# Patient Record
Sex: Female | Born: 1960 | Race: White | Hispanic: No | Marital: Married | Smoking: Never smoker
Health system: Southern US, Community
[De-identification: ages and names within clinical notes are randomized; demographics above are authoritative.]

## PROBLEM LIST (undated history)

## (undated) DIAGNOSIS — N83209 Unspecified ovarian cyst, unspecified side: Secondary | ICD-10-CM

## (undated) DIAGNOSIS — K219 Gastro-esophageal reflux disease without esophagitis: Secondary | ICD-10-CM

## (undated) DIAGNOSIS — D649 Anemia, unspecified: Secondary | ICD-10-CM

## (undated) DIAGNOSIS — Z170 Estrogen receptor positive status [ER+]: Principal | ICD-10-CM

## (undated) DIAGNOSIS — Z8742 Personal history of other diseases of the female genital tract: Secondary | ICD-10-CM

## (undated) DIAGNOSIS — B009 Herpesviral infection, unspecified: Secondary | ICD-10-CM

## (undated) DIAGNOSIS — J3089 Other allergic rhinitis: Secondary | ICD-10-CM

## (undated) DIAGNOSIS — J302 Other seasonal allergic rhinitis: Secondary | ICD-10-CM

## (undated) DIAGNOSIS — C50212 Malignant neoplasm of upper-inner quadrant of left female breast: Principal | ICD-10-CM

## (undated) DIAGNOSIS — I10 Essential (primary) hypertension: Secondary | ICD-10-CM

## (undated) DIAGNOSIS — T7840XA Allergy, unspecified, initial encounter: Secondary | ICD-10-CM

## (undated) HISTORY — PX: OTHER SURGICAL HISTORY: SHX169

## (undated) HISTORY — DX: Herpesviral infection, unspecified: B00.9

## (undated) HISTORY — DX: Allergy, unspecified, initial encounter: T78.40XA

## (undated) HISTORY — DX: Essential (primary) hypertension: I10

## (undated) HISTORY — DX: Other seasonal allergic rhinitis: J30.2

## (undated) HISTORY — DX: Unspecified ovarian cyst, unspecified side: N83.209

## (undated) HISTORY — DX: Other allergic rhinitis: J30.89

## (undated) HISTORY — PX: ADENOIDECTOMY: SUR15

## (undated) HISTORY — DX: Personal history of other diseases of the female genital tract: Z87.42

## (undated) HISTORY — DX: Anemia, unspecified: D64.9

## (undated) HISTORY — DX: Estrogen receptor positive status (ER+): Z17.0

## (undated) HISTORY — DX: Malignant neoplasm of upper-inner quadrant of left female breast: C50.212

## (undated) HISTORY — PX: BREAST SURGERY: SHX581

---

## 1998-03-26 ENCOUNTER — Other Ambulatory Visit: Admission: RE | Admit: 1998-03-26 | Discharge: 1998-03-26 | Payer: Self-pay | Admitting: Obstetrics and Gynecology

## 1998-05-07 ENCOUNTER — Ambulatory Visit (HOSPITAL_COMMUNITY): Admission: RE | Admit: 1998-05-07 | Discharge: 1998-05-07 | Payer: Self-pay | Admitting: Obstetrics and Gynecology

## 1998-10-01 ENCOUNTER — Inpatient Hospital Stay (HOSPITAL_COMMUNITY): Admission: AD | Admit: 1998-10-01 | Discharge: 1998-10-04 | Payer: Self-pay | Admitting: Obstetrics and Gynecology

## 1999-03-18 ENCOUNTER — Other Ambulatory Visit: Admission: RE | Admit: 1999-03-18 | Discharge: 1999-03-18 | Payer: Self-pay | Admitting: Obstetrics and Gynecology

## 2000-04-16 ENCOUNTER — Other Ambulatory Visit: Admission: RE | Admit: 2000-04-16 | Discharge: 2000-04-16 | Payer: Self-pay | Admitting: Obstetrics and Gynecology

## 2001-05-06 ENCOUNTER — Other Ambulatory Visit: Admission: RE | Admit: 2001-05-06 | Discharge: 2001-05-06 | Payer: Self-pay | Admitting: Obstetrics and Gynecology

## 2002-05-10 ENCOUNTER — Other Ambulatory Visit: Admission: RE | Admit: 2002-05-10 | Discharge: 2002-05-10 | Payer: Self-pay | Admitting: *Deleted

## 2003-05-16 ENCOUNTER — Other Ambulatory Visit: Admission: RE | Admit: 2003-05-16 | Discharge: 2003-05-16 | Payer: Self-pay | Admitting: *Deleted

## 2003-06-25 ENCOUNTER — Other Ambulatory Visit: Admission: RE | Admit: 2003-06-25 | Discharge: 2003-06-25 | Payer: Self-pay | Admitting: *Deleted

## 2004-05-22 ENCOUNTER — Other Ambulatory Visit: Admission: RE | Admit: 2004-05-22 | Discharge: 2004-05-22 | Payer: Self-pay | Admitting: *Deleted

## 2005-07-24 ENCOUNTER — Other Ambulatory Visit: Admission: RE | Admit: 2005-07-24 | Discharge: 2005-07-24 | Payer: Self-pay | Admitting: *Deleted

## 2005-09-01 ENCOUNTER — Ambulatory Visit: Payer: Self-pay | Admitting: Family Medicine

## 2005-09-08 ENCOUNTER — Ambulatory Visit: Payer: Self-pay | Admitting: Family Medicine

## 2005-09-15 ENCOUNTER — Ambulatory Visit: Payer: Self-pay | Admitting: Family Medicine

## 2005-09-22 ENCOUNTER — Ambulatory Visit: Payer: Self-pay | Admitting: Family Medicine

## 2005-10-06 ENCOUNTER — Ambulatory Visit: Payer: Self-pay | Admitting: Family Medicine

## 2006-09-07 ENCOUNTER — Other Ambulatory Visit: Admission: RE | Admit: 2006-09-07 | Discharge: 2006-09-07 | Payer: Self-pay | Admitting: Obstetrics & Gynecology

## 2007-09-15 ENCOUNTER — Other Ambulatory Visit: Admission: RE | Admit: 2007-09-15 | Discharge: 2007-09-15 | Payer: Self-pay | Admitting: Obstetrics and Gynecology

## 2008-12-28 ENCOUNTER — Encounter: Admission: RE | Admit: 2008-12-28 | Discharge: 2008-12-28 | Payer: Self-pay | Admitting: Emergency Medicine

## 2012-01-25 ENCOUNTER — Ambulatory Visit (INDEPENDENT_AMBULATORY_CARE_PROVIDER_SITE_OTHER): Payer: BC Managed Care – PPO | Admitting: Physician Assistant

## 2012-01-25 VITALS — BP 149/74 | HR 79 | Temp 98.4°F | Resp 16 | Ht 66.18 in | Wt 182.0 lb

## 2012-01-25 DIAGNOSIS — J019 Acute sinusitis, unspecified: Secondary | ICD-10-CM

## 2012-01-25 DIAGNOSIS — R05 Cough: Secondary | ICD-10-CM

## 2012-01-25 DIAGNOSIS — I10 Essential (primary) hypertension: Secondary | ICD-10-CM

## 2012-01-25 DIAGNOSIS — R059 Cough, unspecified: Secondary | ICD-10-CM

## 2012-01-25 MED ORDER — PROMETHAZINE-DM 6.25-15 MG/5ML PO SYRP: 5.0000 mL | ORAL_SOLUTION | Freq: Four times a day (QID) | ORAL | Status: AC | PRN

## 2012-01-25 MED ORDER — LOSARTAN POTASSIUM-HCTZ 100-25 MG PO TABS: 1.0000 | ORAL_TABLET | Freq: Every day | ORAL | Status: DC

## 2012-01-25 MED ORDER — AMOXICILLIN-POT CLAVULANATE 875-125 MG PO TABS: 1.0000 | ORAL_TABLET | Freq: Two times a day (BID) | ORAL | Status: AC

## 2012-01-25 NOTE — Progress Notes (Signed)
Patient ID: Kristina Sanders MRN: [id number], DOB: 05/03/1961, 51 y.o. Date of Encounter: 01/25/2012, 5:56 PM  Primary Physician: No primary provider on file.  Chief Complaint:  Chief Complaint  Patient presents with  . Sinusitis    x 2 weeks  advil, sudafed  . Hypertension    med refill    HPI: 51 y.o. year old female presents with 2 week history of nasal congestion, post nasal drip, sore throat, sinus pressure, and cough. Afebrile. No chills. Nasal congestion thick and green/yellow. Cough is productive of green/yellow sputum and not associated with time of day. Sinus pressure greatest along the right maxillary sinus. Upper teeth sore. Ears feel full, leading to sensation of muffled hearing. Has tried Sudafed without success. No GI complaints. Appetite slightly decreased.  Also requests refill of Hyzaar. Doing well with current dose. No side effects. Sometimes checks her BP at home with results of 120s/70s-80s. BP slightly elevated today secondary to above illness and taking Sudafed. No CP, HA, vision changes, or focal deficits.   No sick contacts, recent antibiotics, or recent travels.   No leg trauma, sedentary periods, h/o cancer, or tobacco use.  Past Medical History  Diagnosis Date  . HTN (hypertension)      Home Meds: Prior to Admission medications   Medication Sig Start Date End Date Taking? Authorizing Provider  losartan-hydrochlorothiazide (HYZAAR) 100-25 MG per tablet Take 1 tablet by mouth daily.   Yes Historical Provider, MD    Allergies: No Known Allergies  History   Social History  . Marital Status: Married    Spouse Name: N/A    Number of Children: N/A  . Years of Education: N/A   Occupational History  . Not on file.   Social History Main Topics  . Smoking status: Former Games developer  . Smokeless tobacco: Not on file  . Alcohol Use: Not on file  . Drug Use: Not on file  . Sexually Active: Not on file   Other Topics Concern  . Not on file   Social  History Narrative  . No narrative on file     Review of Systems: Constitutional: negative for chills, fever, night sweats or weight changes Cardiovascular: negative for chest pain or palpitations Respiratory: negative for hemoptysis, wheezing, or shortness of breath Abdominal: negative for abdominal pain, nausea, vomiting or diarrhea Dermatological: negative for rash Neurologic: negative for headache   Physical Exam: Blood pressure 149/74, pulse 79, temperature 98.4 F (36.9 C), temperature source Oral, resp. rate 16, height 5' 6.18" (1.681 m), weight 182 lb (82.555 kg)., Body mass index is 29.22 kg/(m^2). General: Well developed, well nourished, in no acute distress. Head: Normocephalic, atraumatic, eyes without discharge, sclera non-icteric, nares are congested. Bilateral auditory canals clear, TM's are without perforation, pearly grey with reflective cone of light bilaterally. Serous effusion bilaterally behind TM's. Right maxillary sinus TTP. Oral cavity moist, dentition normal. Posterior pharynx with post nasal drip and mild erythema. No peritonsillar abscess or tonsillar exudate. Neck: Supple. No thyromegaly. Full ROM. No lymphadenopathy. Lungs: Clear bilaterally to auscultation without wheezes, rales, or rhonchi. Breathing is unlabored.  Heart: RRR with S1 S2. No murmurs, rubs, or gallops appreciated. Msk:  Strength and tone normal for age. Extremities: No clubbing or cyanosis. No edema. Neuro: Alert and oriented X 3. Moves all extremities spontaneously. CNII-XII grossly in tact. Psych:  Responds to questions appropriately with a normal affect.    BMP pending  ASSESSMENT AND PLAN:  51 y.o. year old female with sinusitis  and hypertension 1. Sinusitis -Augmentin 875/125 mg #20 1 po bid no RF -Phenergan DM #120 mL 1 tsp po q 6 hours prn cough no RF SED -Mucinex -Tylenol/Motrin prn -Rest/fluids -RTC precautions -RTC 3-5 days if no improvement  2. Hypertension -Refill  Hyzaar 100/25 mg #30 1 po daily RF 6 -Avoid Sudafed in the future -Healthy diet and exercise -Await BMP  Signed, Eula Listen, PA-C 01/25/2012 5:56 PM

## 2012-01-26 LAB — BASIC METABOLIC PANEL
CO2: 26 mEq/L (ref 19–32)
Calcium: 9.8 mg/dL (ref 8.4–10.5)
Chloride: 101 mEq/L (ref 96–112)
Glucose, Bld: 97 mg/dL (ref 70–99)
Potassium: 3.3 mEq/L — ABNORMAL LOW (ref 3.5–5.3)
Sodium: 139 mEq/L (ref 135–145)

## 2012-10-08 ENCOUNTER — Encounter: Payer: Self-pay | Admitting: Family Medicine

## 2012-10-08 DIAGNOSIS — J3089 Other allergic rhinitis: Secondary | ICD-10-CM

## 2012-11-15 ENCOUNTER — Other Ambulatory Visit: Payer: Self-pay | Admitting: Physician Assistant

## 2012-11-15 NOTE — Telephone Encounter (Signed)
Needs OV.  

## 2012-11-26 ENCOUNTER — Ambulatory Visit (INDEPENDENT_AMBULATORY_CARE_PROVIDER_SITE_OTHER): Payer: BC Managed Care – PPO | Admitting: Emergency Medicine

## 2012-11-26 VITALS — BP 136/81 | HR 77 | Temp 98.7°F | Resp 18 | Ht 67.0 in | Wt 187.8 lb

## 2012-11-26 DIAGNOSIS — J018 Other acute sinusitis: Secondary | ICD-10-CM

## 2012-11-26 MED ORDER — LOSARTAN POTASSIUM-HCTZ 100-25 MG PO TABS: 1.0000 | ORAL_TABLET | Freq: Every day | ORAL | Status: DC

## 2012-11-26 MED ORDER — PSEUDOEPHEDRINE-GUAIFENESIN ER 60-600 MG PO TB12: 1.0000 | ORAL_TABLET | Freq: Two times a day (BID) | ORAL | Status: DC

## 2012-11-26 MED ORDER — AMOXICILLIN-POT CLAVULANATE 875-125 MG PO TABS: 1.0000 | ORAL_TABLET | Freq: Two times a day (BID) | ORAL | Status: DC

## 2012-11-26 NOTE — Progress Notes (Signed)
[facility] [address], [place] [address] [phone]  Date:  11/26/2012   Name:  Kristina Sanders   DOB:  1960-12-06   MRN:  [id number]  PCP:  No primary provider on file.    Chief Complaint: ear pressure, Nasal Congestion and Cough   History of Present Illness:  Kristina Sanders is a 52 y.o. very pleasant female patient who presents with the following:  Ill with a "headcold" since Christmas and over the past week has developed pain in frontal region and purulent nasal drainage and post nasal drip.  Non productive cough.  No fever or chills.  Malaise and fatigue.  Teacher in elementary school.  No improvement with OTC medication  Patient Active Problem List  Diagnosis  . HTN (hypertension)  . Allergic rhinitis due to other allergen    Past Medical History  Diagnosis Date  . HTN (hypertension)   . Allergy   . Perennial allergic rhinitis     [place]   . Seasonal allergic rhinitis     [place]    No past surgical history on file.  History  Substance Use Topics  . Smoking status: Never Smoker   . Smokeless tobacco: Not on file  . Alcohol Use: Yes    No family history on file.  No Known Allergies  Medication list has been reviewed and updated.  Current Outpatient Prescriptions on File Prior to Visit  Medication Sig Dispense Refill  . losartan-hydrochlorothiazide (HYZAAR) 100-25 MG per tablet Take 1 tablet by mouth daily. NEED OFFICE VISIT  30 tablet  0    Review of Systems:  As per HPI, otherwise negative.    Physical Examination: Filed Vitals:   11/26/12 1328  BP: 136/81  Pulse: 77  Temp: 98.7 F (37.1 C)  Resp: 18   Filed Vitals:   11/26/12 1328  Height: 5\' 7"  (1.702 m)  Weight: 187 lb 12.8 oz (85.186 kg)   Body mass index is 29.41 kg/(m^2). Ideal Body Weight: Weight in (lb) to have BMI = 25: 159.3   GEN: WDWN, NAD, Non-toxic, A & O x 3 HEENT: Atraumatic, Normocephalic. Neck supple. No masses, No LAD. Ears and  Nose: No external deformity.  TM negative.  Purulent nasal drainage CV: RRR, No M/G/R. No JVD. No thrill. No extra heart sounds. PULM: CTA B, no wheezes, crackles, rhonchi. No retractions. No resp. distress. No accessory muscle use. ABD: S, NT, ND, +BS. No rebound. No HSM. EXTR: No c/c/e NEURO Normal gait.  PSYCH: Normally interactive. Conversant. Not depressed or anxious appearing.  Calm demeanor.    Assessment and Plan: Sinusitis augmentin mucinex d Follow up as needed  Carmelina Dane, MD

## 2013-01-18 ENCOUNTER — Other Ambulatory Visit: Payer: Self-pay | Admitting: Physician Assistant

## 2013-05-03 ENCOUNTER — Ambulatory Visit: Payer: BC Managed Care – PPO | Admitting: Obstetrics & Gynecology

## 2013-05-05 ENCOUNTER — Ambulatory Visit: Payer: BC Managed Care – PPO | Admitting: Obstetrics & Gynecology

## 2013-06-13 ENCOUNTER — Other Ambulatory Visit: Payer: Self-pay | Admitting: Emergency Medicine

## 2013-06-14 ENCOUNTER — Other Ambulatory Visit: Payer: Self-pay | Admitting: Physician Assistant

## 2013-07-13 ENCOUNTER — Ambulatory Visit: Payer: BC Managed Care – PPO | Admitting: Obstetrics & Gynecology

## 2013-07-20 ENCOUNTER — Other Ambulatory Visit: Payer: Self-pay | Admitting: Physician Assistant

## 2013-07-24 ENCOUNTER — Telehealth: Payer: Self-pay

## 2013-07-24 MED ORDER — LOSARTAN POTASSIUM-HCTZ 100-25 MG PO TABS: 1.0000 | ORAL_TABLET | Freq: Every day | ORAL | Status: DC

## 2013-07-24 NOTE — Telephone Encounter (Signed)
Sent in

## 2013-07-24 NOTE — Telephone Encounter (Signed)
Patient made an appt. For Thursday for blood pressure med refills. Patient wants to know if we can call the pharmacy ([facility] [address]) and allow a few pills to get her through until her appt. Thursday 07/27/13.  Her number is [phone]

## 2013-07-27 ENCOUNTER — Encounter: Payer: Self-pay | Admitting: Family Medicine

## 2013-07-27 ENCOUNTER — Ambulatory Visit (INDEPENDENT_AMBULATORY_CARE_PROVIDER_SITE_OTHER): Payer: BC Managed Care – PPO | Admitting: Family Medicine

## 2013-07-27 VITALS — BP 170/94 | HR 82 | Temp 99.4°F | Resp 16 | Ht 66.0 in | Wt 190.0 lb

## 2013-07-27 DIAGNOSIS — I10 Essential (primary) hypertension: Secondary | ICD-10-CM

## 2013-07-27 DIAGNOSIS — Z0271 Encounter for disability determination: Secondary | ICD-10-CM

## 2013-07-27 DIAGNOSIS — Z760 Encounter for issue of repeat prescription: Secondary | ICD-10-CM

## 2013-07-27 MED ORDER — LOSARTAN POTASSIUM-HCTZ 100-25 MG PO TABS: 1.0000 | ORAL_TABLET | Freq: Every day | ORAL | Status: DC

## 2013-07-29 ENCOUNTER — Other Ambulatory Visit: Payer: Self-pay | Admitting: Family Medicine

## 2013-07-29 DIAGNOSIS — E876 Hypokalemia: Secondary | ICD-10-CM

## 2013-07-29 DIAGNOSIS — I10 Essential (primary) hypertension: Secondary | ICD-10-CM

## 2013-07-29 NOTE — Progress Notes (Signed)
S:  This 52 y.o. Cauc female is here for medication refill for anti-hypertensive; BP well controlled on current medication which she ran out of 2 days ago. She has been asymptomatic. BP readings at home: 120-130/70-80. Pt denies vision changes, CP or tightness, palpitations, SOB or DOE, cough, edema, HA, dizziness, numbness or syncope.  PMHx, Soc Hx and Fam Hx reviewed.  ROS: As per HPI.  O: Filed Vitals:   07/27/13 1459  BP: 170/94  Pulse: 82  Temp: 99.4 F (37.4 C)  Resp: 16   GEN: In NAD; WN,WD. HENT: [place]/AT; EOMI w/ clear conj/sclerae. Otherwise unremarkable. COR: RRR. LUNGS: Normal resp rate and effort. SKIN: W&D. NEURO: A&O x 3; nonfocal.  A/P: Unspecified essential hypertension- Stable w/ elevated today because pt out of med. No changes at this time.  Issue of repeat prescriptions Meds ordered this encounter  Medications  . losartan-hydrochlorothiazide (HYZAAR) 100-25 MG per tablet    Sig: Take 1 tablet by mouth daily.    Dispense:  30 tablet    Refill:  5

## 2013-07-30 ENCOUNTER — Telehealth: Payer: Self-pay | Admitting: Family Medicine

## 2013-07-30 NOTE — Telephone Encounter (Signed)
Left message on mobil voice mail to call back so she can come back in for lab only BMET per [facility]

## 2013-08-20 ENCOUNTER — Telehealth: Payer: Self-pay | Admitting: Family Medicine

## 2013-08-20 NOTE — Telephone Encounter (Signed)
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

## 2013-10-05 ENCOUNTER — Encounter: Payer: Self-pay | Admitting: Family Medicine

## 2013-11-13 ENCOUNTER — Encounter: Payer: Self-pay | Admitting: Obstetrics & Gynecology

## 2013-11-13 ENCOUNTER — Ambulatory Visit (INDEPENDENT_AMBULATORY_CARE_PROVIDER_SITE_OTHER): Payer: BC Managed Care – PPO | Admitting: Obstetrics & Gynecology

## 2013-11-13 VITALS — BP 156/80 | HR 64 | Resp 20 | Ht 66.25 in | Wt 187.0 lb

## 2013-11-13 DIAGNOSIS — Z0000 Encounter for general adult medical examination without abnormal findings: Secondary | ICD-10-CM

## 2013-11-13 DIAGNOSIS — Z01419 Encounter for gynecological examination (general) (routine) without abnormal findings: Secondary | ICD-10-CM

## 2013-11-13 DIAGNOSIS — N912 Amenorrhea, unspecified: Secondary | ICD-10-CM

## 2013-11-13 LAB — POCT URINALYSIS DIPSTICK
Bilirubin, UA: NEGATIVE
Glucose, UA: NEGATIVE
Ketones, UA: NEGATIVE
Nitrite, UA: NEGATIVE
pH, UA: 5

## 2013-11-13 NOTE — Progress Notes (Signed)
52 y.o. G4P3 MarriedCaucasianF here for annual exam.  No vaginal bleeding in a little over a year.  IUD was placed 5/13.  Bled for a few months after IUD placement.  Does have some hot flashes and night sweats.  Doesn't really interfere with sleep.    Saw PCP every six months.  Blood work is done every six months.  Will do fasting labs at next visit.  She does check her BP at home and values are better than when in doctor's office.  D/W pt her MMG.  Pt did the 3D.      Sexually active: yes  The current method of family planning is vasectomy and IUD.    Exercising: yes  occasional walking Smoker:  no  Health Maintenance: Pap:  04/06/12 WNL/negative HR HPV History of abnormal Pap:  no MMG:  05/16/13 3D normal Colonoscopy:  None, pt is planning on doing it this summer BMD:   none TDaP:  2007 Screening Labs: PCP, Hb today: 12.3, Urine today: RBC-trace   reports that she has never smoked. She has never used smokeless tobacco. She reports that she drinks about 3.0 ounces of alcohol per week. She reports that she does not use illicit drugs.  Past Medical History  Diagnosis Date  . HTN (hypertension)   . Allergy   . Perennial allergic rhinitis     [place]   . Seasonal allergic rhinitis     [place]  . HSV infection     h/o  . Anemia     Past Surgical History  Procedure Laterality Date  . Cesarean section    . Tonsillectomy and adenoidectomy      Current Outpatient Prescriptions  Medication Sig Dispense Refill  . losartan-hydrochlorothiazide (HYZAAR) 100-25 MG per tablet Take 1 tablet by mouth daily.  30 tablet  5  . NON FORMULARY Allergy injections once weekly       No current facility-administered medications for this visit.    Family History  Problem Relation Age of Onset  . Mitral valve prolapse Mother   . Breast cancer Paternal Aunt 70    ROS:  Pertinent items are noted in HPI.  Otherwise, a comprehensive ROS was negative.  Exam:   BP 156/80  Pulse 64  Resp 20  Ht  5' 6.25" (1.683 m)  Wt 187 lb (84.823 kg)  BMI 29.95 kg/m2  LMP 11/16/2010  Weight change: +5lbs   Height: 5' 6.25" (168.3 cm)  Ht Readings from Last 3 Encounters:  11/13/13 5' 6.25" (1.683 m)  07/27/13 5\' 6"  (1.676 m)  11/26/12 5\' 7"  (1.702 m)    General appearance: alert, cooperative and appears stated age Head: Normocephalic, without obvious abnormality, atraumatic Neck: no adenopathy, supple, symmetrical, trachea midline and thyroid normal to inspection and palpation Lungs: clear to auscultation bilaterally Breasts: normal appearance, no masses or tenderness Heart: regular rate and rhythm Abdomen: soft, non-tender; bowel sounds normal; no masses,  no organomegaly Extremities: extremities normal, atraumatic, no cyanosis or edema Skin: Skin color, texture, turgor normal. No rashes or lesions Lymph nodes: Cervical, supraclavicular, and axillary nodes normal. No abnormal inguinal nodes palpated Neurologic: Grossly normal   Pelvic: External genitalia:  no lesions              Urethra:  normal appearing urethra with no masses, tenderness or lesions              Bartholins and Skenes: normal  Vagina: normal appearing vagina with normal color and discharge, no lesions              Cervix: no lesions              Pap taken: no Bimanual Exam:  Uterus:  normal size, contour, position, consistency, mobility, non-tender              Adnexa: normal adnexa and no mass, fullness, tenderness               Rectovaginal: Confirms               Anus:  normal sphincter tone, no lesions  A:  Well Woman with normal exam H/O menorrhagia and adenomyosis Mirena placed 5/13 Hypertension  P:   Mammogram yearly. pap smear not indicated.  Normal pap with Neg HR HPV 5/13 Labs with PCP every 6 months. return annually or prn  An After Visit Summary was printed and given to the patient.

## 2013-11-13 NOTE — Patient Instructions (Signed)

## 2013-11-14 LAB — FOLLICLE STIMULATING HORMONE: FSH: 63 m[IU]/mL

## 2013-11-20 ENCOUNTER — Telehealth: Payer: Self-pay

## 2013-11-20 NOTE — Telephone Encounter (Signed)
Message copied by Robley Fries on Mon Nov 20, 2013  1:58 PM ------      Message from: Megan Salon      Created: Wed Nov 15, 2013  7:27 AM       Please inform [place] shows menopausal range.  I would not recommend IUD removal at this time.  I would just wait until is due to be removed.  If she desires removal sooner, please have her call and I can take it out. ------

## 2013-11-20 NOTE — Telephone Encounter (Signed)
lmtcb

## 2013-11-20 NOTE — Telephone Encounter (Signed)
Patient is calling Kelly back. °

## 2013-11-21 NOTE — Telephone Encounter (Signed)
Returning a call to [place]. Please call after 3:00.

## 2013-11-21 NOTE — Telephone Encounter (Signed)
Lmtcb//kn 

## 2013-11-24 MED ORDER — NITROFURANTOIN MONOHYD MACRO 100 MG PO CAPS: ORAL_CAPSULE | ORAL | Status: DC

## 2013-11-24 NOTE — Telephone Encounter (Signed)
Rx done. 

## 2013-11-24 NOTE — Telephone Encounter (Signed)
Patient notified of lab results. Will call back if she decides to have her IUD removed earlier. States she forgot to ask Dr Sabra Heck for macrobid rx as needed. Chart is in your in box.

## 2014-03-02 ENCOUNTER — Other Ambulatory Visit: Payer: Self-pay | Admitting: Family Medicine

## 2014-03-02 ENCOUNTER — Other Ambulatory Visit: Payer: Self-pay | Admitting: Physician Assistant

## 2014-03-02 NOTE — Telephone Encounter (Signed)
Phoned pt lm RTC for follow up

## 2014-04-03 ENCOUNTER — Ambulatory Visit (INDEPENDENT_AMBULATORY_CARE_PROVIDER_SITE_OTHER): Payer: BC Managed Care – PPO | Admitting: Physician Assistant

## 2014-04-03 VITALS — BP 160/88 | HR 86 | Temp 99.5°F | Resp 16 | Ht 65.75 in | Wt 182.2 lb

## 2014-04-03 DIAGNOSIS — I10 Essential (primary) hypertension: Secondary | ICD-10-CM

## 2014-04-03 LAB — LIPID PANEL
CHOLESTEROL: 196 mg/dL (ref 0–200)
HDL: 60 mg/dL (ref >39)
LDL CALC: 113 mg/dL — AB (ref 0–99)
TRIGLYCERIDES: 116 mg/dL (ref <150)
Total CHOL/HDL Ratio: 3.3 Ratio
VLDL: 23 mg/dL (ref 0–40)

## 2014-04-03 LAB — BASIC METABOLIC PANEL
BUN: 13 mg/dL (ref 6–23)
CHLORIDE: 104 meq/L (ref 96–112)
CO2: 24 mEq/L (ref 19–32)
CREATININE: 0.56 mg/dL (ref 0.50–1.10)
Calcium: 9.4 mg/dL (ref 8.4–10.5)
GLUCOSE: 121 mg/dL — AB (ref 70–99)
Potassium: 3.8 mEq/L (ref 3.5–5.3)
Sodium: 139 mEq/L (ref 135–145)

## 2014-04-03 MED ORDER — LOSARTAN POTASSIUM-HCTZ 100-25 MG PO TABS: 1.0000 | ORAL_TABLET | Freq: Every day | ORAL | Status: DC

## 2014-04-03 NOTE — Progress Notes (Signed)
   Subjective:    Patient ID: Kristina Sanders, female    DOB: [birth year], 53 y.o.   MRN: [id number]  HPI   Kristina Sanders is a very pleasant 53 yr old female here for refill of Hyzaar.  Has been on this medication for several years, unsure exactly when she started.  Was on lisinopril previously but had cough with this.  She reports that she checks blood pressure infrequently at home but typically sees systolics 009-381 with diastolics 82-99.  No fam hx HTN.  She denies CP, SOB, edema, HA, visual change.  Did not take meds two days ago or yesterday, did take today though BP is still elevated.  She reports she has white coat HTN as well.  She is fasting today would like lipid panel in addition to BMP   Review of Systems  Constitutional: Negative.   Respiratory: Negative for cough, shortness of breath and wheezing.   Cardiovascular: Negative for chest pain, palpitations and leg swelling.  Musculoskeletal: Negative.   Skin: Negative.        Objective:   Physical Exam  Vitals reviewed. Constitutional: She is oriented to person, place, and time. She appears well-developed and well-nourished. No distress.  HENT:  Head: Normocephalic.  Eyes: Conjunctivae are normal. No scleral icterus.  Cardiovascular: Normal rate, regular rhythm, normal heart sounds and intact distal pulses.   Pulmonary/Chest: Effort normal and breath sounds normal. She has no wheezes. She has no rales.  Musculoskeletal: She exhibits no edema.  Neurological: She is alert and oriented to person, place, and time.  Skin: Skin is warm and dry.  Psychiatric: She has a normal mood and affect. Her behavior is normal.       Assessment & Plan:  Hypertension - Plan: Basic metabolic panel, Lipid panel, losartan-hydrochlorothiazide (HYZAAR) 100-25 MG per tablet   Kristina Sanders is a very pleasant 53 yr old female here for RF of HTN medication.  Has been well controlled on Hyzaar.  BP elevated today but has been out of meds x 3 days,  additionally pt endorses white coat htn.  Will continue same dose of medication.  Encouraged pt to check home BPs 1-2x/wk.  If >140/>90 may need to adjust meds.  Check BMP today.  FLP per pt request  Pt to call or RTC if worsening or not improving  E. Natividad Brood MHS, PA-C [facility] 5/19/201512:16 PM

## 2014-04-03 NOTE — Patient Instructions (Signed)
Continue taking the Hyzaar once daily  Check your blood pressure at home about twice per week - check at random and varying times.  If you see numbers that are consistently >140 on top or >90 on the bottom, we may need to adjust your medication  I will let you know when labs are back and if we need to do anything else based on them   Hypertension As your heart beats, it forces blood through your arteries. This force is your blood pressure. If the pressure is too high, it is called hypertension (HTN) or high blood pressure. HTN is dangerous because you may have it and not know it. High blood pressure may mean that your heart has to work harder to pump blood. Your arteries may be narrow or stiff. The extra work puts you at risk for heart disease, stroke, and other problems.  Blood pressure consists of two numbers, a higher number over a lower, 110/72, for example. It is stated as "110 over 72." The ideal is below 120 for the top number (systolic) and under 80 for the bottom (diastolic). Write down your blood pressure today. You should pay close attention to your blood pressure if you have certain conditions such as:  Heart failure.  Prior heart attack.  Diabetes  Chronic kidney disease.  Prior stroke.  Multiple risk factors for heart disease. To see if you have HTN, your blood pressure should be measured while you are seated with your arm held at the level of the heart. It should be measured at least twice. A one-time elevated blood pressure reading (especially in the Emergency Department) does not mean that you need treatment. There may be conditions in which the blood pressure is different between your right and left arms. It is important to see your caregiver soon for a recheck. Most people have essential hypertension which means that there is not a specific cause. This type of high blood pressure may be lowered by changing lifestyle factors such as:  Stress.  Smoking.  Lack of  exercise.  Excessive weight.  Drug/tobacco/alcohol use.  Eating less salt. Most people do not have symptoms from high blood pressure until it has caused damage to the body. Effective treatment can often prevent, delay or reduce that damage. TREATMENT  When a cause has been identified, treatment for high blood pressure is directed at the cause. There are a large number of medications to treat HTN. These fall into several categories, and your caregiver will help you select the medicines that are best for you. Medications may have side effects. You should review side effects with your caregiver. If your blood pressure stays high after you have made lifestyle changes or started on medicines,   Your medication(s) may need to be changed.  Other problems may need to be addressed.  Be certain you understand your prescriptions, and know how and when to take your medicine.  Be sure to follow up with your caregiver within the time frame advised (usually within two weeks) to have your blood pressure rechecked and to review your medications.  If you are taking more than one medicine to lower your blood pressure, make sure you know how and at what times they should be taken. Taking two medicines at the same time can result in blood pressure that is too low. SEEK IMMEDIATE MEDICAL CARE IF:  You develop a severe headache, blurred or changing vision, or confusion.  You have unusual weakness or numbness, or a faint feeling.  You have severe chest or abdominal pain, vomiting, or breathing problems. MAKE SURE YOU:   Understand these instructions.  Will watch your condition.  Will get help right away if you are not doing well or get worse. Document Released: 11/02/2005 Document Revised: 01/25/2012 Document Reviewed: 06/22/2008 [facility] Patient Information 2014 [place].

## 2014-04-04 ENCOUNTER — Encounter: Payer: Self-pay | Admitting: Radiology

## 2014-07-29 ENCOUNTER — Encounter (HOSPITAL_COMMUNITY): Payer: Self-pay | Admitting: Emergency Medicine

## 2014-07-29 ENCOUNTER — Emergency Department (HOSPITAL_COMMUNITY)
Admission: EM | Admit: 2014-07-29 | Discharge: 2014-07-30 | Disposition: A | Discharge status: Home / Self Care | Payer: BC Managed Care – PPO | Attending: Emergency Medicine | Admitting: Emergency Medicine

## 2014-07-29 DIAGNOSIS — R110 Nausea: Secondary | ICD-10-CM | POA: Diagnosis not present

## 2014-07-29 DIAGNOSIS — R062 Wheezing: Secondary | ICD-10-CM | POA: Insufficient documentation

## 2014-07-29 DIAGNOSIS — J029 Acute pharyngitis, unspecified: Secondary | ICD-10-CM | POA: Diagnosis not present

## 2014-07-29 DIAGNOSIS — R000 Tachycardia, unspecified: Secondary | ICD-10-CM | POA: Insufficient documentation

## 2014-07-29 DIAGNOSIS — E876 Hypokalemia: Secondary | ICD-10-CM | POA: Diagnosis not present

## 2014-07-29 DIAGNOSIS — R209 Unspecified disturbances of skin sensation: Secondary | ICD-10-CM | POA: Diagnosis present

## 2014-07-29 DIAGNOSIS — Z862 Personal history of diseases of the blood and blood-forming organs and certain disorders involving the immune mechanism: Secondary | ICD-10-CM | POA: Diagnosis not present

## 2014-07-29 DIAGNOSIS — Z8619 Personal history of other infectious and parasitic diseases: Secondary | ICD-10-CM | POA: Insufficient documentation

## 2014-07-29 DIAGNOSIS — I10 Essential (primary) hypertension: Secondary | ICD-10-CM | POA: Diagnosis not present

## 2014-07-29 DIAGNOSIS — Z79899 Other long term (current) drug therapy: Secondary | ICD-10-CM | POA: Insufficient documentation

## 2014-07-29 LAB — I-STAT CHEM 8, ED
BUN: 14 mg/dL (ref 6–23)
CALCIUM ION: 1.1 mmol/L — AB (ref 1.12–1.23)
CHLORIDE: 98 meq/L (ref 96–112)
Creatinine, Ser: 0.7 mg/dL (ref 0.50–1.10)
Glucose, Bld: 175 mg/dL — ABNORMAL HIGH (ref 70–99)
HCT: 35 % — ABNORMAL LOW (ref 36.0–46.0)
Hemoglobin: 11.9 g/dL — ABNORMAL LOW (ref 12.0–15.0)
POTASSIUM: 2.4 meq/L — AB (ref 3.7–5.3)
Sodium: 138 mEq/L (ref 137–147)
TCO2: 24 mmol/L (ref 0–100)

## 2014-07-29 LAB — BASIC METABOLIC PANEL
ANION GAP: 11 (ref 5–15)
BUN: 12 mg/dL (ref 6–23)
CHLORIDE: 106 meq/L (ref 96–112)
CO2: 25 mEq/L (ref 19–32)
Calcium: 8.6 mg/dL (ref 8.4–10.5)
Creatinine, Ser: 0.56 mg/dL (ref 0.50–1.10)
Glucose, Bld: 103 mg/dL — ABNORMAL HIGH (ref 70–99)
POTASSIUM: 3.5 meq/L — AB (ref 3.7–5.3)
SODIUM: 142 meq/L (ref 137–147)

## 2014-07-29 LAB — MAGNESIUM: Magnesium: 1.7 mg/dL (ref 1.5–2.5)

## 2014-07-29 MED ORDER — SODIUM CHLORIDE 0.9 % IV BOLUS (SEPSIS)
1000.0000 mL | Freq: Once | INTRAVENOUS | Status: AC
  Administered 2014-07-29: 1000 mL via INTRAVENOUS

## 2014-07-29 MED ORDER — POTASSIUM CHLORIDE 10 MEQ/100ML IV SOLN
10.0000 meq | Freq: Once | INTRAVENOUS | Status: AC
  Administered 2014-07-29: 10 meq via INTRAVENOUS
  Filled 2014-07-29: qty 100

## 2014-07-29 MED ORDER — POTASSIUM CHLORIDE CRYS ER 20 MEQ PO TBCR
40.0000 meq | EXTENDED_RELEASE_TABLET | Freq: Once | ORAL | Status: AC
  Administered 2014-07-29: 40 meq via ORAL
  Filled 2014-07-29: qty 2

## 2014-07-29 NOTE — ED Provider Notes (Signed)
Patient present to the emergency complaints of numbness tachycardia and paresthesias. She had numbness in her hands and cramping in both hands Physical Exam  BP 147/68  Pulse 96  Temp(Src) 98.2 F (36.8 C) (Oral)  Resp 18  SpO2 99%  Physical Exam  Nursing note and vitals reviewed. Constitutional: She appears well-developed and well-nourished. No distress.  HENT:  Head: Normocephalic and atraumatic.  Right Ear: External ear normal.  Left Ear: External ear normal.  Eyes: Conjunctivae are normal. Right eye exhibits no discharge. Left eye exhibits no discharge. No scleral icterus.  Neck: Neck supple. No tracheal deviation present.  Cardiovascular: Normal rate.   Pulmonary/Chest: Effort normal. No stridor. No respiratory distress.  Musculoskeletal: She exhibits no edema.  Neurological: She is alert. Cranial nerve deficit: no gross deficits.  Normal strength and sensation throughout no facial droop  Skin: Skin is warm and dry. No rash noted.  Psychiatric: She has a normal mood and affect.   Medications  sodium chloride 0.9 % bolus 1,000 mL (not administered)  potassium chloride 10 mEq in 100 mL IVPB (10 mEq Intravenous New Bag/Given 07/29/14 1955)  sodium chloride 0.9 % bolus 1,000 mL (1,000 mLs Intravenous New Bag/Given 07/29/14 1850)  potassium chloride SA (K-DUR,KLOR-CON) CR tablet 40 mEq (40 mEq Oral Given 07/29/14 1955)     EKG Interpretation  Date/Time:  Sunday July 29 2014 18:04:41 EDT Ventricular Rate:  118 PR Interval:  110 QRS Duration: 74 QT Interval:  275 QTC Calculation: 385 R Axis:   77 Text Interpretation:  Sinus or ectopic atrial tachycardia nonspecific st abnormality, diffuse leads Baseline wander in lead(s) V1 No previous tracing Confirmed by Ambriel Gorelick  MD-J, Latysha Thackston ([id number]) on 07/29/2014 6:16:23 PM       ED Course  Procedures  MDM The patient's laboratory test showed significant hypokalemia. This may be a combination of chronic hyperproteinemia associated with  some electrolyte shifts associated with her hyperventilation.  WIll replace her potassium.  CHeck her magnesium.   May consider outpatient treatment if she continues to improve   Dorie Rank, MD 07/29/14 2035

## 2014-07-29 NOTE — ED Notes (Signed)
Magnesium ordered by dr Tomi Bamberger informed by dr Tomi Bamberger to wait till iv ran first..klj

## 2014-07-29 NOTE — ED Provider Notes (Signed)
CSN: [id number]     Arrival date & time 07/29/14  1758 History   First MD Initiated Contact with Patient 07/29/14 1812     Chief Complaint  Patient presents with  . Numbness     (Consider location/radiation/quality/duration/timing/severity/associated sxs/prior Treatment) HPI Pt is a 53 y/o female w/ PMHx of HTN, panic attacks, and allergies who presents to the ED for tingling that started around 5:50pm today. Pt was sitting down when she noticed acute tingling all over her body and she became SOB. She also experience some muscle cramping. She denies chest pain and diaphoresis. She was able to walk in to the ED and states that all her symptoms have resolved. Denes LOC, dizziness, changes in vision, weakness, and difficulty with speech. She had nausea but did not vomit. Denies recent travel or sick contacts. Pt was at the pool this afternoon and thinks she could have been dehydrated. She last drank alcohol last night.     Past Medical History  Diagnosis Date  . HTN (hypertension)   . Allergy   . Perennial allergic rhinitis     [place]   . Seasonal allergic rhinitis     [place]  . HSV infection     h/o  . Anemia    Past Surgical History  Procedure Laterality Date  . Cesarean section    . Tonsillectomy and adenoidectomy     Family History  Problem Relation Age of Onset  . Mitral valve prolapse Mother   . Breast cancer Paternal Aunt 61   History  Substance Use Topics  . Smoking status: Never Smoker   . Smokeless tobacco: Never Used  . Alcohol Use: 3.0 - 4.0 oz/week    6-8 drink(s) per week   OB History   Grav Para Term Preterm Abortions TAB SAB Ect Mult Living   4 3        3      Review of Systems  Constitutional: Negative for fever.  HENT: Positive for sore throat.   Eyes: Negative for visual disturbance.  Respiratory: Positive for shortness of breath.   Cardiovascular: Negative for chest pain and palpitations.  Gastrointestinal: Positive for nausea. Negative for  vomiting.  Genitourinary: Negative for dysuria.  Neurological: Positive for light-headedness and numbness. Negative for dizziness, speech difficulty and weakness.      Allergies  Review of patient's allergies indicates no known allergies.  Home Medications   Prior to Admission medications   Medication Sig Start Date End Date Taking? Authorizing Provider  fexofenadine (ALLEGRA) 180 MG tablet Take 180 mg by mouth daily.   Yes Historical Provider, MD  ibuprofen (ADVIL,MOTRIN) 200 MG tablet Take 600 mg by mouth every 6 (six) hours as needed for moderate pain.   Yes Historical Provider, MD  losartan-hydrochlorothiazide (HYZAAR) 100-25 MG per tablet Take 1 tablet by mouth daily. 04/03/14  Yes Theda Sers, PA-C  NON FORMULARY Allergy injections once weekly   Yes Historical Provider, MD  valACYclovir (VALTREX) 500 MG tablet Take 500 mg by mouth as needed (cold sores).  06/25/14  Yes Historical Provider, MD  vitamin C (ASCORBIC ACID) 500 MG tablet Take 1,000 mg by mouth daily.   Yes Historical Provider, MD   BP 147/68  Pulse 96  Temp(Src) 98.2 F (36.8 C) (Oral)  Resp 18  SpO2 99% Physical Exam  Constitutional: She is oriented to person, place, and time. She appears well-developed and well-nourished. No distress.  HENT:  Mouth/Throat: Oropharynx is clear and moist.  Eyes: Conjunctivae and EOM  are normal. Pupils are equal, round, and reactive to light.  Cardiovascular: Regular rhythm.   No murmur heard. Tachycardic   Pulmonary/Chest: Effort normal. She has wheezes (expiratory).  Abdominal: Soft. Bowel sounds are normal. There is no tenderness.  Neurological: She is alert and oriented to person, place, and time. No cranial nerve deficit.  Normal heel to shin test, negative pronator drift    ED Course  Procedures (including critical care time) Labs Review Labs Reviewed  I-STAT CHEM 8, ED - Abnormal; Notable for the following:    Potassium 2.4 (*)    Glucose, Bld 175 (*)     Calcium, Ion 1.10 (*)    Hemoglobin 11.9 (*)    HCT 35.0 (*)    All other components within normal limits  MAGNESIUM  BASIC METABOLIC PANEL      EKG Interpretation   Date/Time:  Sunday July 29 2014 18:04:41 EDT Ventricular Rate:  118 PR Interval:  110 QRS Duration: 74 QT Interval:  275 QTC Calculation: 385 R Axis:   77 Text Interpretation:  Sinus or ectopic atrial tachycardia nonspecific st  abnormality, diffuse leads Baseline wander in lead(s) V1 No previous  tracing Confirmed by KNAPP  MD-J, JON ([id number]) on 07/29/2014 6:16:23 PM      MDM   Final diagnoses:  Hypokalemia    Pt presents to the ED w/ light headedness, SOB, and tingling that has fully resolved since admission to the ED (around 1 hour). DDx are dehydration, TIA, electrolyte disturbance, and hyperventilation. WIll get i-Stat 8 and administer IVFs.   iStat revealed hypokalemia, replaced K+, magnesium WNL. Will get repeat BMP. If K+ WNL will d/c patient home.    Julious Oka, MD 07/29/14 [phone]

## 2014-07-29 NOTE — ED Notes (Signed)
Pt reports sudden onset of feeling numb all over, pt is tachy and very anxious

## 2014-07-29 NOTE — Discharge Instructions (Signed)

## 2014-08-24 ENCOUNTER — Ambulatory Visit: Payer: BC Managed Care – PPO | Admitting: Family Medicine

## 2014-09-10 ENCOUNTER — Other Ambulatory Visit: Payer: Self-pay | Admitting: Family Medicine

## 2014-09-10 ENCOUNTER — Ambulatory Visit (INDEPENDENT_AMBULATORY_CARE_PROVIDER_SITE_OTHER): Payer: BC Managed Care – PPO | Admitting: Family Medicine

## 2014-09-10 ENCOUNTER — Encounter: Payer: Self-pay | Admitting: Family Medicine

## 2014-09-10 VITALS — BP 120/74 | HR 87 | Temp 98.1°F | Resp 16 | Ht 66.5 in | Wt 180.8 lb

## 2014-09-10 DIAGNOSIS — Z131 Encounter for screening for diabetes mellitus: Secondary | ICD-10-CM

## 2014-09-10 DIAGNOSIS — I10 Essential (primary) hypertension: Secondary | ICD-10-CM

## 2014-09-10 DIAGNOSIS — Z1211 Encounter for screening for malignant neoplasm of colon: Secondary | ICD-10-CM

## 2014-09-10 DIAGNOSIS — M25511 Pain in right shoulder: Secondary | ICD-10-CM

## 2014-09-10 DIAGNOSIS — Z23 Encounter for immunization: Secondary | ICD-10-CM

## 2014-09-10 MED ORDER — LOSARTAN POTASSIUM-HCTZ 100-25 MG PO TABS: 1.0000 | ORAL_TABLET | Freq: Every day | ORAL | Status: DC

## 2014-09-10 MED ORDER — MELOXICAM 7.5 MG PO TABS
7.5000 mg | ORAL_TABLET | Freq: Every day | ORAL | Status: DC
Start: 2014-09-10 — End: 2015-04-03

## 2014-09-10 NOTE — Patient Instructions (Signed)

## 2014-09-10 NOTE — Progress Notes (Signed)
   Subjective:    Patient ID: Kristina Sanders, female    DOB: 1961/03/10, 53 y.o.   MRN: [id number]  HPI Patient presents today for follow up of hypokalemia that she experienced last month. She has not had further symptoms. She has been taking some OTC potassium tablets, she is not sure of the dose.  Patient has pain in her right upper arm. 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 Pain off and on x 6 weeks. No fatigue, no weakness, no numbness or tingling. Has not taken any medication for shoulder pain.   She has regular gyn care and has an appointment 1/16. She is aware that she is overdue for her mammogram and will schedule. She has not had a colonoscopy.   Review of Systems No fever, no chills, no chest pain, no SOB, no palpitations, no numbness or tingling in arms or legs, no dizziness.    Objective:   Physical Exam  Vitals reviewed. Constitutional: She is oriented to person, place, and time. She appears well-developed and well-nourished.  HENT:  Head: Normocephalic and atraumatic.  Eyes: Conjunctivae are normal.  Neck: Normal range of motion. Neck supple.  Cardiovascular: Normal rate, regular rhythm and normal heart sounds.   Pulmonary/Chest: Effort normal and breath sounds normal.  Musculoskeletal: Normal range of motion. She exhibits no edema.       Right shoulder: She exhibits tenderness (over deltoid). She exhibits normal range of motion, no bony tenderness, no swelling, no effusion and no crepitus.  Neurological: She is alert and oriented to person, place, and time.  Skin: Skin is warm and dry.  Psychiatric: She has a normal mood and affect. Her behavior is normal. Judgment and thought content normal.      Assessment & Plan:  1. Need for prophylactic vaccination and inoculation against influenza - Flu Vaccine QUAD 36+ mos IM  2. Essential hypertension -She will wait on her labs before getting a new prescription  filled - losartan-hydrochlorothiazide (HYZAAR) 100-25 MG per tablet; Take 1 tablet by mouth daily.  Dispense: 90 tablet; Refill: 1 - Basic metabolic panel  3. Right shoulder pain - meloxicam (MOBIC) 7.5 MG tablet; Take 1 tablet (7.5 mg total) by mouth daily.  Dispense: 30 tablet; Refill: 0 -provided with shoulder exercises  4. Screening for colon cancer - Ambulatory referral to Gastroenterology  5. Screening for diabetes mellitus (DM) - Hemoglobin A1c -Encouraged patient on continued weight loss and provided nutritional counseling  Elby Beck, FNP-BC  [facility]  09/12/2014 9:15 PM

## 2014-09-11 ENCOUNTER — Other Ambulatory Visit: Payer: Self-pay | Admitting: Family Medicine

## 2014-09-11 DIAGNOSIS — I10 Essential (primary) hypertension: Secondary | ICD-10-CM

## 2014-09-11 LAB — BASIC METABOLIC PANEL
BUN: 11 mg/dL (ref 6–23)
CHLORIDE: 95 meq/L — AB (ref 96–112)
CO2: 25 meq/L (ref 19–32)
Calcium: 9.4 mg/dL (ref 8.4–10.5)
Creat: 0.53 mg/dL (ref 0.50–1.10)
Glucose, Bld: 99 mg/dL (ref 70–99)
Potassium: 3.6 mEq/L (ref 3.5–5.3)
Sodium: 134 mEq/L — ABNORMAL LOW (ref 135–145)

## 2014-09-11 LAB — HEMOGLOBIN A1C
Hgb A1c MFr Bld: 5.7 % — ABNORMAL HIGH (ref <5.7)
Mean Plasma Glucose: 117 mg/dL — ABNORMAL HIGH (ref <117)

## 2014-09-11 MED ORDER — LOSARTAN POTASSIUM 100 MG PO TABS: 100.0000 mg | ORAL_TABLET | Freq: Every day | ORAL | Status: DC

## 2014-09-17 ENCOUNTER — Encounter: Payer: Self-pay | Admitting: Family Medicine

## 2014-11-10 ENCOUNTER — Other Ambulatory Visit: Payer: Self-pay | Admitting: Family Medicine

## 2014-11-26 ENCOUNTER — Encounter: Payer: Self-pay | Admitting: Gastroenterology

## 2014-12-10 ENCOUNTER — Encounter: Payer: Self-pay | Admitting: Gastroenterology

## 2014-12-10 ENCOUNTER — Ambulatory Visit (INDEPENDENT_AMBULATORY_CARE_PROVIDER_SITE_OTHER): Payer: BC Managed Care – PPO | Admitting: Obstetrics & Gynecology

## 2014-12-10 ENCOUNTER — Encounter: Payer: Self-pay | Admitting: Obstetrics & Gynecology

## 2014-12-10 VITALS — BP 176/82 | HR 80 | Resp 20 | Ht 66.25 in | Wt 187.2 lb

## 2014-12-10 DIAGNOSIS — Z124 Encounter for screening for malignant neoplasm of cervix: Secondary | ICD-10-CM

## 2014-12-10 DIAGNOSIS — Z01419 Encounter for gynecological examination (general) (routine) without abnormal findings: Secondary | ICD-10-CM

## 2014-12-10 DIAGNOSIS — R87619 Unspecified abnormal cytological findings in specimens from cervix uteri: Secondary | ICD-10-CM

## 2014-12-10 MED ORDER — NITROFURANTOIN MONOHYD MACRO 100 MG PO CAPS: ORAL_CAPSULE | ORAL | Status: DC

## 2014-12-10 NOTE — Progress Notes (Signed)
54 y.o. G4P3 MarriedCaucasianF here for annual exam.  Doing well.  No vaginal bleeding.  Having some hot flashes.  Sleeping well.  No vaginal dryness.    Went to ER due to feeling chest tightness.  Pt had been at the pool all day.  Pt thought she may have been dehydrated.  Was on Hyzaar and now on Cozaar (no HCTZ).  Has follow up again scheduled in three more months.  Will have repeat labs again then.    PCP:  Dr. Leward Quan, urgent care on [address]  No LMP recorded. Patient is not currently having periods (Reason: IUD).          Sexually active: Yes.    The current method of family planning is vasectomy.    Exercising: Yes.    walking Smoker:  no  Health Maintenance: Pap:  04/06/12 WNL/negative HR HPV History of abnormal Pap:  no MMG:  05/16/13 3D-normal-scheduled for 2/16 Colonoscopy:  Scheduled for 4/16 BMD:   none TDaP:  2007 Screening Labs: PCP, Hb today: PCP, Urine today: PCP   reports that she has never smoked. She has never used smokeless tobacco. She reports that she drinks about 2.4 - 3.6 oz of alcohol per week. She reports that she does not use illicit drugs.  Past Medical History  Diagnosis Date  . HTN (hypertension)   . Allergy   . Perennial allergic rhinitis     [place]   . Seasonal allergic rhinitis       . HSV infection     h/o  . Anemia     Past Surgical History  Procedure Laterality Date  . Cesarean section    . Tonsillectomy and adenoidectomy      Current Outpatient Prescriptions  Medication Sig Dispense Refill  . fexofenadine (ALLEGRA) 180 MG tablet Take 180 mg by mouth daily.    Marland Kitchen ibuprofen (ADVIL,MOTRIN) 200 MG tablet Take 600 mg by mouth every 6 (six) hours as needed for moderate pain.    Marland Kitchen losartan (COZAAR) 100 MG tablet Take 1 tablet (100 mg total) by mouth daily. Please come by for BP check. 30 tablet 3  . meloxicam (MOBIC) 7.5 MG tablet Take 1 tablet (7.5 mg total) by mouth daily. 30 tablet 0  . NON FORMULARY Allergy injections once weekly     . valACYclovir (VALTREX) 500 MG tablet Take 500 mg by mouth as needed (cold sores).     . vitamin C (ASCORBIC ACID) 500 MG tablet Take 1,000 mg by mouth daily.     No current facility-administered medications for this visit.    Family History  Problem Relation Age of Onset  . Mitral valve prolapse Mother   . Breast cancer Paternal Aunt 32    ROS:  Pertinent items are noted in HPI.  Otherwise, a comprehensive ROS was negative.  Exam:   General appearance: alert, cooperative and appears stated age Head: Normocephalic, without obvious abnormality, atraumatic Neck: no adenopathy, supple, symmetrical, trachea midline and thyroid normal to inspection and palpation Lungs: clear to auscultation bilaterally Breasts: normal appearance, no masses or tenderness Heart: regular rate and rhythm Abdomen: soft, non-tender; bowel sounds normal; no masses,  no organomegaly Extremities: extremities normal, atraumatic, no cyanosis or edema Skin: Skin color, texture, turgor normal. No rashes or lesions Lymph nodes: Cervical, supraclavicular, and axillary nodes normal. No abnormal inguinal nodes palpated Neurologic: Grossly normal   Pelvic: External genitalia:  no lesions              Urethra:  normal appearing urethra with no masses, tenderness or lesions              Bartholins and Skenes: normal                 Vagina: normal appearing vagina with normal color and discharge, no lesions              Cervix: no lesions, IUD string not seen.              Pap taken: Yes.   Bimanual Exam:  Uterus:                Adnexa: normal adnexa and no mass, fullness, tenderness               Rectovaginal: Confirms               Anus:  normal sphincter tone, no lesions  Chaperone was present for exam.  A:  Well Woman with normal exam H/O menorrhagia and adenomyosis Mirena placed 5/13 Hypertension.  Pt aware of value today.  She is going to check at home in AM and if elevated, she will call for earlier  appt.  Names given for PCPs if pt decides to change from urgent care. H/o recurrent UTIs  P: Mammogram yearly. pap smear not indicated. Normal pap with Neg HR HPV 5/13 Labs with PCP every 6 months. Macrobid 100mg  prn.  #30/3RF return annually or prn

## 2014-12-13 LAB — IPS PAP TEST WITH REFLEX TO HPV

## 2014-12-17 NOTE — Addendum Note (Signed)
Addended by: Megan Salon on: 12/17/2014 07:33 AM   Modules accepted: Orders, SmartSet

## 2014-12-19 LAB — IPS HPV ON A LIQUID BASED SPECIMEN

## 2014-12-20 ENCOUNTER — Telehealth: Payer: Self-pay | Admitting: *Deleted

## 2014-12-20 DIAGNOSIS — R87619 Unspecified abnormal cytological findings in specimens from cervix uteri: Secondary | ICD-10-CM

## 2014-12-20 NOTE — Telephone Encounter (Signed)
Call to patient cell number. Voice mail is full. Unable to leave message. Call to work number. Message to call back.

## 2014-12-20 NOTE — Telephone Encounter (Signed)
-----   Message from Lyman Speller, MD sent at 12/17/2014  7:34 AM EST ----- Inform pt there are some endometrial cells on her pap that need additional evaluation.  I have added a HR HPV to the pap.  Now she needs [facility], endo biopsy and ECC.  Thanks.

## 2014-12-21 ENCOUNTER — Other Ambulatory Visit: Payer: Self-pay | Admitting: Obstetrics & Gynecology

## 2014-12-21 MED ORDER — LORAZEPAM 1 MG PO TABS: ORAL_TABLET | ORAL | Status: DC

## 2014-12-21 NOTE — Telephone Encounter (Addendum)
Call to patient. Advised of pap results and recommendation per Dr Sabra Heck. Procedures explained and instructed to take Motrin 800 mg one hour prior with food.-Appointment scheduled for 12-27-14 at 1030.  Patient has IUD in place which she states she did not tolerate very well and has discussed with Dr Sabra Heck needing medication for anxiety for future procedures. Advised patient will review with MD, will need to arrive early for procedure and sign consent before taking medication and then will need driver for trip home. Patient agreeable.  Do you want her to have medication before procedure?

## 2014-12-21 NOTE — Telephone Encounter (Signed)
Yes.  I think she would do better with Ativan or Valium earlier.  Ativan 1mg  po at least 2 hours before procedure, repeat in 1 hour if not feeling medication.

## 2014-12-24 NOTE — Telephone Encounter (Signed)
Spoke with patient. Advised of message as seen below from [place]. Patient is agreeable. Patient will come to pick up rx tomorrow or Wednesday. Patient is aware that she will need a driver to and from appointment. Patient is agreeable.  Routing to provider for final review. Patient agreeable to disposition. Will close encounter

## 2014-12-24 NOTE — Telephone Encounter (Signed)
Patient needs to sign consent when she picks up prescription.

## 2014-12-24 NOTE — Telephone Encounter (Signed)
Left message to call Suraj Ramdass at [phone]. 

## 2014-12-26 NOTE — Telephone Encounter (Signed)
Patient came into office to pick up rx. Rx provided to patient. Patient has signed the general consent and EMB consent form. Both of which I have attached to her chart. Reminded patient to take 1mg  po 2 hours before procedure and may repeat if needed. Patient is agreeable. Advised will need driver to and from appointment. Patient is agreeable.  Routing to provider for final review. Patient agreeable to disposition. Will close encounter

## 2014-12-27 ENCOUNTER — Ambulatory Visit (INDEPENDENT_AMBULATORY_CARE_PROVIDER_SITE_OTHER): Payer: BC Managed Care – PPO

## 2014-12-27 ENCOUNTER — Other Ambulatory Visit: Payer: Self-pay | Admitting: Obstetrics & Gynecology

## 2014-12-27 ENCOUNTER — Ambulatory Visit (INDEPENDENT_AMBULATORY_CARE_PROVIDER_SITE_OTHER): Payer: BC Managed Care – PPO | Admitting: Obstetrics & Gynecology

## 2014-12-27 VITALS — BP 104/78 | Resp 18 | Ht 66.25 in | Wt 186.0 lb

## 2014-12-27 DIAGNOSIS — R87619 Unspecified abnormal cytological findings in specimens from cervix uteri: Secondary | ICD-10-CM

## 2014-12-27 NOTE — Progress Notes (Signed)
54 y.o.Marriedfemale here for a pelvic ultrasound due to atypical endometrial cells on Pap that was obtained 12/10/14.  Pt had a mirena IUD and really has no bleeding.  Has done very well with the IUD.  Because of abnormal cells on pap, ultrasound with endometrial biopsy and ECC planned.  Spouse with pt today.  Findings all reviewed as well as plan and possible follow-up, additional tests.  All questions answered.    No LMP recorded. Patient is not currently having periods (Reason: IUD).  Sexually active:  yes  Contraception: IUD   Technique:  Both transabdominal and transvaginal ultrasound examinations of the pelvis were performed. Transabdominal technique was performed for global imaging of the pelvis including uterus, ovaries, adnexal regions, and pelvic cul-de-sac.  It was necessary to proceed with endovaginal exam following the abdominal ultrasound transabdominal exam to visualize the endometrium and adnexa.  Color and duplex Doppler ultrasound was utilized to evaluate blood flow to the ovaries.   FINDINGS: UTERUS: 8.2 x 5.4 x 3.5cm with two small fibroids 1.3 and 1.2cm, IUD in place EMS: 2.66mm ADNEXA:   Left ovary 2.0 x 1.2 x 0.8cm   Right ovary 1.8 x 1.2 x 0.9cm CUL DE SAC: no free fluid  SHSG:  After obtaining appropriate verbal consent from patient, the cervix was visualized using a speculum, and prepped with betadine.  A tenaculum  was applied to the cervix.  Dilation of the cervix was not necessary. The catheter was passed into the uterus and sterile saline introduced, with the following findings:  Possible 33mm polyp noted in lower uterine segment.  Very difficult to differentiate from base of IUD.  Endometrial biopsy recommended. Verbal and written consent obtained.  Speculum placed.  Cervix visualized and cleansed with betadine prep.  A single toothed tenaculum was applied to the anterior lip of the cervix.  Endometrial pipelle was advanced through the cervix into the endometrial cavity  without difficulty.  Pipelle passed to 7 cm.  Suction applied and pipelle removed with good tissue sample obtained.  Tenculum removed.  ECC was then performed.  No bleeding noted.  Patient tolerated procedure well.  All instruments removed.  Assessment:  Atypical endometrial cells on Pap Two small fibroids  Plan:  Endometrial biopsy and ECC pending.  Results will be called to pt.  If everything is negative, feel repeat Pap in 4-6 months (at minimum) would be prudent.  Pt in agreement with plan.  All questions answered.    ~25 minutes spent with patient >50% of time was in face to face discussion of above.

## 2015-01-03 ENCOUNTER — Encounter: Payer: Self-pay | Admitting: Obstetrics & Gynecology

## 2015-01-03 ENCOUNTER — Telehealth: Payer: Self-pay | Admitting: Emergency Medicine

## 2015-01-03 DIAGNOSIS — R87619 Unspecified abnormal cytological findings in specimens from cervix uteri: Secondary | ICD-10-CM

## 2015-01-03 NOTE — Telephone Encounter (Signed)
-----   Message from Lyman Speller, MD sent at 01/03/2015  8:27 AM EST ----- Inform pathology shows no abnormal cells.  Reviewed recommendations for evaluation and she needs a colposcopy. Can we schedule tomorrow, if possible.

## 2015-01-03 NOTE — Telephone Encounter (Signed)
Spoke with patient. Message from Dr. Sabra Heck given. Patient agreeable to scheduling colposcopy. Brief description of procedure given. Patient scheduled for tomorrow at 1000. Patient wondering if she may need pre-medication. She feels she may have to go back to work tomorrow after procedure, she is a Pharmacist, hospital. Advised colposcopy may be tolerable without pre-medication. Patient agreeable. Advised 800 mg motrin po one hour before procedure with a meal. She verbalized understanding of instructions. Routing to provider for final review. Patient agreeable to disposition. Will close encounter.

## 2015-01-04 ENCOUNTER — Ambulatory Visit (INDEPENDENT_AMBULATORY_CARE_PROVIDER_SITE_OTHER): Payer: BC Managed Care – PPO | Admitting: Obstetrics & Gynecology

## 2015-01-04 VITALS — BP 140/80 | HR 80 | Resp 16 | Ht 66.25 in | Wt 187.0 lb

## 2015-01-04 DIAGNOSIS — R87619 Unspecified abnormal cytological findings in specimens from cervix uteri: Secondary | ICD-10-CM

## 2015-01-04 NOTE — Progress Notes (Signed)
Subjective:     Patient ID: Kristina Sanders, female   DOB: 03-15-1961, 54 y.o.   MRN: [id number]  HPI 54 y.o. Married Not Hispanic or Latino female here for colposcopy with possible biopsies and/or ECC due to atypical endometrial cells on Pap obtained 12/10/14 at [facility].  Pt has already undergone a [facility] with endometrial biopsy and ECC.  ECC did not have any endocervical cells so will be repeated today.    No LMP recorded. Patient is not currently having periods (Reason: IUD).          Sexually active: Yes.    The current method of family planning is IUD.    Patient has been counseled about results and procedure.  Risks and benefits have bene reviewed including immediate and/or delayed bleeding, infection, cervical scaring from procedure, possibility of needing additional follow up as well as treatment.  rare risks of missing a lesion discussed as well.  All questions answered.  Pt ready to proceed.   Review of Systems     Objective:   Physical Exam BP 140/80 mmHg  Pulse 80  Resp 16  Ht 5' 6.25" (1.683 m)  Wt 187 lb (84.823 kg)  BMI 29.95 kg/m2  General appearance: alert, cooperative and appears stated age Head: Normocephalic, without obvious abnormality, atraumatic Neurologic: Grossly normal  Pelvic: External genitalia:  no lesions              Urethra:  normal appearing urethra with no masses, tenderness or lesions              Bartholins and Skenes: normal                 Vagina: normal appearing vagina with normal color and discharge, no lesions              Cervix: no lesions              Pap taken: No.  Speculum placed.  3% acetic acid applied to cervix for >45 seconds.  Cervix visualized with both 7.5X and 15X magnification.  Green filter also used.  Lugols solution was used.  Findings:  Minimal decreased staining with Lugol's at 12 and 4 o'clock positions.  Biopsy:  12 and 4 o'clock.  ECC:  was performed.  Monsel's was needed.  Excellent hemostasis was present.  Pt tolerated  procedure well and all instruments were removed.  Findings noted above on picture of cervix.    Assessment:     Atypical endometrial cells on Pap     Plan:     Biopsies and ECC pending

## 2015-01-08 ENCOUNTER — Telehealth: Payer: Self-pay

## 2015-01-08 NOTE — Telephone Encounter (Signed)
-----   Message from [place], MD sent at 01/08/2015 11:30 AM EST ----- This is Dr. Quincy Simmonds reviewing Dr. Ammie Ferrier inbox in her absence.   Please report to the patient the good news of her colposcopy evaluation! The biopsies showed atrophy and some inflammation.  There was no sign of cancer or precancer.   I read Dr. Ammie Ferrier notes. On the day of her ultrasound and endometrial biopsy, she states she is planning for a repeat pap for the patient in 4 months.  Please enter this recall.  Dr. Sabra Heck will also review the results and make any modifications necessary after her return next week.

## 2015-01-08 NOTE — Telephone Encounter (Signed)
Call to patient, unable to leave message due to cell phone mailbox is full//kn

## 2015-02-04 NOTE — Telephone Encounter (Signed)
Patient notified of results. Has follow up scheduled in 6/16//kn

## 2015-02-07 ENCOUNTER — Ambulatory Visit (AMBULATORY_SURGERY_CENTER): Payer: Self-pay | Admitting: *Deleted

## 2015-02-07 VITALS — Ht 66.0 in | Wt 187.4 lb

## 2015-02-07 DIAGNOSIS — Z1211 Encounter for screening for malignant neoplasm of colon: Secondary | ICD-10-CM

## 2015-02-07 MED ORDER — MOVIPREP 100 G PO SOLR: 1.0000 | Freq: Once | ORAL | Status: DC

## 2015-02-07 NOTE — Progress Notes (Signed)
No egg or soy allergy No issues with past sedation, no difficulty with intubation No diet pills No home 02 use emmi video --saw husbands emmi video

## 2015-02-22 ENCOUNTER — Encounter: Payer: Self-pay | Admitting: Gastroenterology

## 2015-02-22 ENCOUNTER — Ambulatory Visit (AMBULATORY_SURGERY_CENTER): Payer: BC Managed Care – PPO | Admitting: Gastroenterology

## 2015-02-22 VITALS — BP 153/84 | HR 71 | Temp 96.9°F | Resp 18 | Ht 66.0 in | Wt 187.0 lb

## 2015-02-22 DIAGNOSIS — Z1211 Encounter for screening for malignant neoplasm of colon: Secondary | ICD-10-CM | POA: Diagnosis not present

## 2015-02-22 MED ORDER — SODIUM CHLORIDE 0.9 % IV SOLN: 500.0000 mL | INTRAVENOUS | Status: DC

## 2015-02-22 NOTE — Progress Notes (Signed)
A/ox3 pleased with MAC, report to Tracy RN 

## 2015-02-22 NOTE — Op Note (Signed)
[place]  [address]. [place], [id number]   COLONOSCOPY PROCEDURE REPORT  PATIENT: Kristina, Sanders  MR#: [id number] BIRTHDATE: 1961-08-27 , 27  yrs. old GENDER: female ENDOSCOPIST: Milus Banister, MD REFERRED:  Hale Bogus, MD PROCEDURE DATE:  02/22/2015 PROCEDURE:   Colonoscopy, screening First Screening Colonoscopy - Avg.  risk and is 50 yrs.  old or older - No.  Prior Negative Screening - Now for repeat screening. N/A  History of Adenoma - Now for follow-up colonoscopy & has been > or = to 3 yrs.  N/A ASA CLASS:   Class II INDICATIONS:Screening for colonic neoplasia and Colorectal Neoplasm Risk Assessment for this procedure is average risk. MEDICATIONS: Monitored anesthesia care and Propofol 350 mg IV  DESCRIPTION OF PROCEDURE:   After the risks benefits and alternatives of the procedure were thoroughly explained, informed consent was obtained.  The digital rectal exam revealed no abnormalities of the rectum.   The LB PCF Q180 [id number]  endoscope was introduced through the anus and advanced to the cecum, which was identified by both the appendix and ileocecal valve. No adverse events experienced.   The quality of the prep was good.  The instrument was then slowly withdrawn as the colon was fully examined.   COLON FINDINGS: A normal appearing cecum, ileocecal valve, and appendiceal orifice were identified.  The ascending, transverse, descending, sigmoid colon, and rectum appeared unremarkable. Retroflexed views revealed no abnormalities. The time to cecum = 4.4 Withdrawal time = 6.3   The scope was withdrawn and the procedure completed. COMPLICATIONS: There were no immediate complications.  ENDOSCOPIC IMPRESSION: Normal colonoscopy No polyps or cancers  RECOMMENDATIONS: You should continue to follow colorectal cancer screening guidelines for "routine risk" patients with a repeat colonoscopy in 10 years.   eSigned:  Milus Banister, MD  02/22/2015 9:19 AM

## 2015-02-22 NOTE — Patient Instructions (Signed)
Impressions/recommendations:  Normal colonoscopy Repeat colonoscopy in 10 years.  YOU HAD AN ENDOSCOPIC PROCEDURE TODAY AT [facility]:   Refer to the procedure report that was given to you for any specific questions about what was found during the examination.  If the procedure report does not answer your questions, please call your gastroenterologist to clarify.  If you requested that your care partner not be given the details of your procedure findings, then the procedure report has been included in a sealed envelope for you to review at your convenience later.  YOU SHOULD EXPECT: Some feelings of bloating in the abdomen. Passage of more gas than usual.  Walking can help get rid of the air that was put into your GI tract during the procedure and reduce the bloating. If you had a lower endoscopy (such as a colonoscopy or flexible sigmoidoscopy) you may notice spotting of blood in your stool or on the toilet paper. If you underwent a bowel prep for your procedure, you may not have a normal bowel movement for a few days.  Please Note:  You might notice some irritation and congestion in your nose or some drainage.  This is from the oxygen used during your procedure.  There is no need for concern and it should clear up in a day or so.  SYMPTOMS TO REPORT IMMEDIATELY:   Following lower endoscopy (colonoscopy or flexible sigmoidoscopy):  Excessive amounts of blood in the stool  Significant tenderness or worsening of abdominal pains  Swelling of the abdomen that is new, acute  Fever of 100F or higher   For urgent or emergent issues, a gastroenterologist can be reached at any hour by calling [phone].   DIET: Your first meal following the procedure should be a small meal and then it is ok to progress to your normal diet. Heavy or fried foods are harder to digest and may make you feel nauseous or bloated.  Likewise, meals heavy in dairy and vegetables can increase bloating.   Drink plenty of fluids but you should avoid alcoholic beverages for 24 hours.  ACTIVITY:  You should plan to take it easy for the rest of today and you should NOT DRIVE or use heavy machinery until tomorrow (because of the sedation medicines used during the test).    FOLLOW UP: Our staff will call the number listed on your records the next business day following your procedure to check on you and address any questions or concerns that you may have regarding the information given to you following your procedure. If we do not reach you, we will leave a message.  However, if you are feeling well and you are not experiencing any problems, there is no need to return our call.  We will assume that you have returned to your regular daily activities without incident.  If any biopsies were taken you will be contacted by phone or by letter within the next 1-3 weeks.  Please call us at [phone] if you have not heard about the biopsies in 3 weeks.    SIGNATURES/CONFIDENTIALITY: You and/or your care partner have signed paperwork which will be entered into your electronic medical record.  These signatures attest to the fact that that the information above on your After Visit Summary has been reviewed and is understood.  Full responsibility of the confidentiality of this discharge information lies with you and/or your care-partner.

## 2015-02-25 ENCOUNTER — Telehealth: Payer: Self-pay | Admitting: *Deleted

## 2015-02-25 NOTE — Telephone Encounter (Signed)
  Follow up Call-  Call back number 02/22/2015  Post procedure Call Back phone  # cell [phone]  Permission to leave phone message Yes   [facility]

## 2015-03-18 ENCOUNTER — Other Ambulatory Visit: Payer: Self-pay | Admitting: Family Medicine

## 2015-03-19 ENCOUNTER — Other Ambulatory Visit: Payer: Self-pay | Admitting: Family Medicine

## 2015-04-03 ENCOUNTER — Ambulatory Visit (INDEPENDENT_AMBULATORY_CARE_PROVIDER_SITE_OTHER): Payer: BC Managed Care – PPO | Admitting: Family Medicine

## 2015-04-03 ENCOUNTER — Encounter: Payer: Self-pay | Admitting: Family Medicine

## 2015-04-03 VITALS — BP 156/84 | HR 72 | Temp 98.4°F | Resp 16 | Ht 66.0 in | Wt 186.8 lb

## 2015-04-03 DIAGNOSIS — I10 Essential (primary) hypertension: Secondary | ICD-10-CM | POA: Diagnosis not present

## 2015-04-03 DIAGNOSIS — Z131 Encounter for screening for diabetes mellitus: Secondary | ICD-10-CM | POA: Diagnosis not present

## 2015-04-03 LAB — BASIC METABOLIC PANEL
BUN: 18 mg/dL (ref 6–23)
CALCIUM: 10 mg/dL (ref 8.4–10.5)
CHLORIDE: 100 meq/L (ref 96–112)
CO2: 29 mEq/L (ref 19–32)
Creat: 0.55 mg/dL (ref 0.50–1.10)
GLUCOSE: 116 mg/dL — AB (ref 70–99)
Potassium: 4.5 mEq/L (ref 3.5–5.3)
SODIUM: 138 meq/L (ref 135–145)

## 2015-04-03 LAB — HEMOGLOBIN A1C
Hgb A1c MFr Bld: 5.7 % — ABNORMAL HIGH (ref <5.7)
Mean Plasma Glucose: 117 mg/dL — ABNORMAL HIGH (ref <117)

## 2015-04-03 MED ORDER — LOSARTAN POTASSIUM 100 MG PO TABS: 100.0000 mg | ORAL_TABLET | Freq: Every day | ORAL | Status: DC

## 2015-04-03 NOTE — Progress Notes (Signed)
Subjective:    Patient ID: Kristina Sanders, female    DOB: Jul 03, 1961, 54 y.o.   MRN: [id number]  HPI Patient presents today for follow up of HTN. Was seen 6 months ago following an episode of hypokalemia. 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 She has not had any problems with her cozaar.   She has not been exercising much lately. Enjoys walking. Eats healthy most of the time, feels like she needs to work on portion control. Her husband enjoys cooking indulgent foods. Last HgbA1C 10/15- 5.7.   Since last visit with me 10/15, she has had normal screening colonoscopy. She had an abnormal PAP with follow up colposcopy- atrophy and inflammation with follow up scheduled next month.  Past Medical History  Diagnosis Date  . HTN (hypertension)   . Allergy   . Perennial allergic rhinitis     [place]   . Seasonal allergic rhinitis     [place]  . HSV infection     h/o  . Anemia   . Ovarian cyst     h/o   Past Surgical History  Procedure Laterality Date  . Cesarean section    . Tonsillectomy and adenoidectomy    . Excision of precancerous mole     Family History  Problem Relation Age of Onset  . Mitral valve prolapse Mother   . Breast cancer Paternal Aunt 56    and bile duct cancer  . Prostate cancer Father   . Colon cancer Neg Hx   . Rectal cancer Neg Hx   . Stomach cancer Neg Hx   . Esophageal cancer Neg Hx    History  Substance Use Topics  . Smoking status: Never Smoker   . Smokeless tobacco: Never Used  . Alcohol Use: 2.4 - 3.6 oz/week    4-6 Standard drinks or equivalent per week   Medications, allergies, past medical history, surgical history, family history, social history and problem list reviewed and updated.  Review of Systems No chest pain, no SOB, no edema, no polyphagia/polydipsia/polyuria.    Objective:   Physical Exam Physical Exam    Constitutional: Oriented to person, place, and time. He appears well-developed and well-nourished.  HENT:  Head: Normocephalic and atraumatic.  Eyes: Conjunctivae are normal.  Neck: Normal range of motion. Neck supple.  Cardiovascular: Normal rate, regular rhythm and normal heart sounds.   Pulmonary/Chest: Effort normal and breath sounds normal.  Musculoskeletal: Normal range of motion.  Neurological: Alert and oriented to person, place, and time.  Skin: Skin is warm and dry.  Psychiatric: Normal mood and affect. Behavior is normal. Judgment and thought content normal.  Vitals reviewed. BP 156/84 mmHg  Pulse 72  Temp(Src) 98.4 F (36.9 C) (Oral)  Resp 16  Ht 5\' 6"  (1.676 m)  Wt 186 lb 12.8 oz (84.732 kg)  BMI 30.16 kg/m2  SpO2 100% Wt Readings from Last 3 Encounters:  04/03/15 186 lb 12.8 oz (84.732 kg)  02/22/15 187 lb (84.823 kg)  02/07/15 187 lb 6.4 oz (85.004 kg)      Assessment & Plan:  1. Screening for diabetes mellitus (DM) - Basic metabolic panel - Hemoglobin A1c  2. Essential hypertension - Basic metabolic panel - losartan (COZAAR) 100 MG tablet; Take 1 tablet (100 mg total) by mouth daily.  Dispense: 90 tablet; Refill: 1 - Recommended periodic home blood pressure monitoring  3.  Obesity - Discussed barriers to healthy eating and regular exercise - Encouraged protein with every meal and snack, exercise at least every 48 hours, incorporate intervals into cardio and add stretching/yoga/mindfulness.   Clarene Reamer, FNP-BC  [facility]  04/04/2015 10:19 PM

## 2015-04-03 NOTE — Patient Instructions (Signed)
Try to exercise at least every other day- add intervals (short, intense bursts) for 1-2 minutes Watch portions, fill up on vegetables and lean meats. Have fun and get enough sleep.

## 2015-04-04 MED ORDER — BLOOD PRESSURE MONITOR/L CUFF MISC: 1.0000 [IU] | Status: DC | PRN

## 2015-05-02 ENCOUNTER — Ambulatory Visit (INDEPENDENT_AMBULATORY_CARE_PROVIDER_SITE_OTHER): Payer: BC Managed Care – PPO | Admitting: Obstetrics & Gynecology

## 2015-05-02 ENCOUNTER — Encounter: Payer: Self-pay | Admitting: Obstetrics & Gynecology

## 2015-05-02 VITALS — BP 160/98 | HR 80 | Resp 16 | Wt 185.0 lb

## 2015-05-02 DIAGNOSIS — R87619 Unspecified abnormal cytological findings in specimens from cervix uteri: Secondary | ICD-10-CM

## 2015-05-02 NOTE — Progress Notes (Signed)
Subjective:     Patient ID: Kristina Sanders, female   DOB: 20-May-1961, 54 y.o.   MRN: [id number]  HPI 54 yo G4P3 MWF here for follow up Pap after having atypical endometrial cells noted on pap obtained at [facility] 12/10/14.  Pt had an endometrial biopsy and ECC for additional evaluation.  The endometrial biopsy pathology was negative but ECC was non diagnostic.  Results were:  Endometrium, biopsy - INACTIVE ENDOMETRIUM WITH PSEUDODECIDUALIZATION CONSISTENT WITH PROGESTIN EFFECT. - NO HYPERPLASIA OR MALIGNANCY. 2. Endocervix, curettage - MUCUS AND BLOOD. - NO EPITHELIUM PRESENT.  Colposcopy was subsequently done with repeat ECC and pathology showed: 1. Cervix, biopsy, 12 o'clock - SUPERFICIAL FRAGMENTS OF ATROPHIC TRANSITIONAL ZONE MUCOSA. - THERE IS NO EVIDENCE OF MALIGNANCY. 2. Cervix, biopsy, 4 o'clock - SUPERFICIAL FRAGMENTS OF INFLAMED ATROPHIC TRANSITIONAL ZONE MUCOSA. - THERE IS NO EVIDENCE OF MALIGNANCY. 3. Endocervix, curettage - SUPERFICIAL FRAGMENTS OF ATROPHIC TRANSITIONAL ZONE MUCOSA. - THERE IS NO EVIDENCE OF MALIGNANCY.  Reports no vaginal bleeding.  Had Mirena IUD placed 5/13.  Hasn't had bleeding in almost three years.  Denies pelvic pain and denies vaginal discharge.  Review of Systems  All other systems reviewed and are negative.      Objective:   Physical Exam  Constitutional: She appears well-developed and well-nourished.  Genitourinary: Vagina normal and uterus normal. There is no rash, tenderness or lesion on the right labia. There is no rash, tenderness or lesion on the left labia. Cervix exhibits no motion tenderness, no discharge and no friability. Right adnexum displays no mass and no tenderness. Left adnexum displays no mass and no tenderness.  Lymphadenopathy:       Right: No inguinal adenopathy present.       Left: No inguinal adenopathy present.  Skin: Skin is warm and dry.  Psychiatric: She has a normal mood and affect.       Assessment:      Atypical endometrial cells on pap obtained at [place] IUD placed 5/13    Plan:     Pap pending.  If similar abrnomality present, will proceed with hysteroscopy and CKC.  D/W pt this.  Voiced understanding.  All questions answered.

## 2015-05-05 LAB — IPS PAP SMEAR ONLY

## 2015-10-13 ENCOUNTER — Other Ambulatory Visit: Payer: Self-pay | Admitting: Family Medicine

## 2015-10-14 ENCOUNTER — Other Ambulatory Visit: Payer: Self-pay | Admitting: Family Medicine

## 2015-10-22 ENCOUNTER — Ambulatory Visit (INDEPENDENT_AMBULATORY_CARE_PROVIDER_SITE_OTHER): Payer: BC Managed Care – PPO | Admitting: Family Medicine

## 2015-10-22 ENCOUNTER — Encounter: Payer: Self-pay | Admitting: Family Medicine

## 2015-10-22 VITALS — BP 160/84 | HR 73 | Temp 98.5°F | Resp 16 | Ht 66.0 in | Wt 182.0 lb

## 2015-10-22 DIAGNOSIS — I10 Essential (primary) hypertension: Secondary | ICD-10-CM

## 2015-10-22 DIAGNOSIS — R7309 Other abnormal glucose: Secondary | ICD-10-CM

## 2015-10-22 DIAGNOSIS — Z23 Encounter for immunization: Secondary | ICD-10-CM | POA: Diagnosis not present

## 2015-10-22 LAB — BASIC METABOLIC PANEL
BUN: 15 mg/dL (ref 7–25)
CALCIUM: 9.2 mg/dL (ref 8.6–10.4)
CO2: 27 mmol/L (ref 20–31)
CREATININE: 0.62 mg/dL (ref 0.50–1.05)
Chloride: 102 mmol/L (ref 98–110)
GLUCOSE: 94 mg/dL (ref 65–99)
Potassium: 3.7 mmol/L (ref 3.5–5.3)
Sodium: 137 mmol/L (ref 135–146)

## 2015-10-22 NOTE — Patient Instructions (Signed)
Please check your blood pressure weekly and keep a log. Send me the results via [place] after Jan. 1 As long as most readings are less than 150/90, it is ok. If they are mostly running high, please get in touch.

## 2015-10-22 NOTE — Progress Notes (Signed)
   Subjective:    Patient ID: Rutha Bouchard, female    DOB: 15-Oct-1961, 54 y.o.   MRN: [id number]  HPI This is a pleasant 54 yo female who presents today for follow up of HTN. She mentions that she has been having a very stressful 24 hours with her daughter's boyfriend having a seizure and her husband being bitten on the face by a dog. She has been checking her blood pressure at home and it runs 130s/80s. She reports compliance with medication and denies side effects. She has been watching her diet and is planning on incorporating more exercise into her routine.  Past Medical History  Diagnosis Date  . HTN (hypertension)   . Allergy   . Perennial allergic rhinitis     [place]   . Seasonal allergic rhinitis     [place]  . HSV infection     h/o  . Anemia   . Ovarian cyst     h/o  . History of abnormal cervical Pap smear    Past Surgical History  Procedure Laterality Date  . Cesarean section    . Tonsillectomy and adenoidectomy    . Excision of precancerous mole     Family History  Problem Relation Age of Onset  . Mitral valve prolapse Mother   . Breast cancer Paternal Aunt 39    and bile duct cancer  . Prostate cancer Father   . Colon cancer Neg Hx   . Rectal cancer Neg Hx   . Stomach cancer Neg Hx   . Esophageal cancer Neg Hx    Social History  Substance Use Topics  . Smoking status: Never Smoker   . Smokeless tobacco: Never Used  . Alcohol Use: 2.4 - 3.6 oz/week    4-6 Standard drinks or equivalent per week   Review of Systems  Constitutional: Negative for fatigue.  Respiratory: Negative for cough, chest tightness and shortness of breath.   Cardiovascular: Negative for chest pain, palpitations and leg swelling.      Objective:   Physical Exam Physical Exam  Constitutional: Oriented to person, place, and time. She appears well-developed and well-nourished.  HENT:  Head: Normocephalic and atraumatic.  Eyes: Conjunctivae are normal.  Neck: Normal range of  motion. Neck supple.  Cardiovascular: Normal rate, regular rhythm and normal heart sounds.   Pulmonary/Chest: Effort normal and breath sounds normal.  Musculoskeletal: Normal range of motion.  Neurological: Alert and oriented to person, place, and time.  Skin: Skin is warm and dry.  Psychiatric: Normal mood and affect. Behavior is normal. Judgment and thought content normal.  Vitals reviewed.  Wt Readings from Last 3 Encounters:  10/22/15 182 lb (82.555 kg)  05/02/15 185 lb (83.915 kg)  04/03/15 186 lb 12.8 oz (84.732 kg)      Assessment & Plan:  1. Need for immunization against influenza - Flu Vaccine QUAD 36+ mos IM (Fluarix)  2. Essential hypertension - discussed her readings and she wishes to keep log at home and will send me 3 weeks of readings via Mychart- parameters provided - if continues to be elevated, consider adding HCTZ 12.5 mg - Basic metabolic panel  3. Elevated glucose - Hemoglobin A1c  Clarene Reamer, FNP-BC  [facility]  10/23/2015 9:29 PM

## 2015-10-23 LAB — HEMOGLOBIN A1C
Hgb A1c MFr Bld: 5.7 % — ABNORMAL HIGH (ref <5.7)
Mean Plasma Glucose: 117 mg/dL — ABNORMAL HIGH (ref <117)

## 2015-11-16 ENCOUNTER — Other Ambulatory Visit: Payer: Self-pay | Admitting: Family Medicine

## 2015-12-02 ENCOUNTER — Encounter: Payer: Self-pay | Admitting: Family Medicine

## 2015-12-05 ENCOUNTER — Other Ambulatory Visit: Payer: Self-pay | Admitting: Family Medicine

## 2015-12-05 DIAGNOSIS — I10 Essential (primary) hypertension: Secondary | ICD-10-CM

## 2015-12-05 MED ORDER — AMLODIPINE BESYLATE 5 MG PO TABS: 5.0000 mg | ORAL_TABLET | Freq: Every day | ORAL | Status: DC

## 2016-02-07 ENCOUNTER — Ambulatory Visit: Payer: BC Managed Care – PPO | Admitting: Obstetrics & Gynecology

## 2016-02-24 ENCOUNTER — Ambulatory Visit: Payer: BC Managed Care – PPO | Admitting: Obstetrics & Gynecology

## 2016-03-04 ENCOUNTER — Other Ambulatory Visit: Payer: Self-pay | Admitting: Family Medicine

## 2016-03-16 ENCOUNTER — Ambulatory Visit (INDEPENDENT_AMBULATORY_CARE_PROVIDER_SITE_OTHER): Payer: BC Managed Care – PPO | Admitting: Obstetrics & Gynecology

## 2016-03-16 ENCOUNTER — Encounter: Payer: Self-pay | Admitting: Obstetrics & Gynecology

## 2016-03-16 VITALS — BP 140/76 | HR 92 | Resp 16 | Ht 66.25 in | Wt 183.0 lb

## 2016-03-16 DIAGNOSIS — Z124 Encounter for screening for malignant neoplasm of cervix: Secondary | ICD-10-CM

## 2016-03-16 DIAGNOSIS — IMO0002 Reserved for concepts with insufficient information to code with codable children: Secondary | ICD-10-CM

## 2016-03-16 DIAGNOSIS — Z01419 Encounter for gynecological examination (general) (routine) without abnormal findings: Secondary | ICD-10-CM | POA: Diagnosis not present

## 2016-03-16 DIAGNOSIS — R896 Abnormal cytological findings in specimens from other organs, systems and tissues: Secondary | ICD-10-CM | POA: Diagnosis not present

## 2016-03-16 DIAGNOSIS — Z0000 Encounter for general adult medical examination without abnormal findings: Secondary | ICD-10-CM | POA: Diagnosis not present

## 2016-03-16 LAB — POCT URINALYSIS DIPSTICK
Bilirubin, UA: NEGATIVE
Blood, UA: NEGATIVE
GLUCOSE UA: NEGATIVE
Ketones, UA: NEGATIVE
LEUKOCYTES UA: NEGATIVE
NITRITE UA: NEGATIVE
PROTEIN UA: NEGATIVE
UROBILINOGEN UA: NEGATIVE
pH, UA: 7

## 2016-03-16 MED ORDER — NITROFURANTOIN MONOHYD MACRO 100 MG PO CAPS: ORAL_CAPSULE | ORAL | Status: DC

## 2016-03-16 NOTE — Progress Notes (Signed)
55 y.o. G4P3 MarriedCaucasianF here for annual exam.  Doing well.  No vaginal bleeding.  IUD placed 5/13.  She does have some night sweats.    H/O AGUS pap 12/10/14. Pt had an endometrial biopsy and ECC for additional evaluation. The endometrial biopsy pathology was negative but ECC was non diagnostic. Results were:  Endometrium, biopsy - INACTIVE ENDOMETRIUM WITH PSEUDODECIDUALIZATION CONSISTENT WITH PROGESTIN EFFECT. - NO HYPERPLASIA OR MALIGNANCY. 2. Endocervix, curettage - MUCUS AND BLOOD. - NO EPITHELIUM PRESENT.  Colposcopy was subsequently done with repeat ECC and pathology showed: 1. Cervix, biopsy, 12 o'clock - SUPERFICIAL FRAGMENTS OF ATROPHIC TRANSITIONAL ZONE MUCOSA. - THERE IS NO EVIDENCE OF MALIGNANCY. 2. Cervix, biopsy, 4 o'clock - SUPERFICIAL FRAGMENTS OF INFLAMED ATROPHIC TRANSITIONAL ZONE MUCOSA. - THERE IS NO EVIDENCE OF MALIGNANCY. 3. Endocervix, curettage - SUPERFICIAL FRAGMENTS OF ATROPHIC TRANSITIONAL ZONE MUCOSA. - THERE IS NO EVIDENCE OF MALIGNANCY.  No LMP recorded. Patient is not currently having periods (Reason: IUD).          Sexually active: Yes.    The current method of family planning is vasectomy and IUD.    Exercising: Yes.    Walking 2-3 x weekly Smoker:  no  Health Maintenance: Pap:  05/02/15 Neg. 12/10/14 HR HPV:neg History of abnormal Pap:  Yes, 12/10/14 Atypical endo cells MMG:  01/09/15 BIRADS2;Neg Colonoscopy:  02/22/15 Normal  BMD:   Never TDaP:  06/2006.  Pt aware this is due and she will have updated with Clarene Reamer.   Screening Labs: PCP, Urine today: Negative   reports that she has never smoked. She has never used smokeless tobacco. She reports that she drinks about 2.4 - 3.6 oz of alcohol per week. She reports that she does not use illicit drugs.  Past Medical History  Diagnosis Date  . HTN (hypertension)   . Allergy   . Perennial allergic rhinitis     [place]   . Seasonal allergic rhinitis     [place]  . HSV infection      h/o  . Anemia   . Ovarian cyst     h/o  . History of abnormal cervical Pap smear     Past Surgical History  Procedure Laterality Date  . Cesarean section    . Tonsillectomy and adenoidectomy    . Excision of precancerous mole      Current Outpatient Prescriptions  Medication Sig Dispense Refill  . amLODipine (NORVASC) 5 MG tablet TAKE 1 TABLET(5 MG) BY MOUTH DAILY 30 tablet 0  . EPIPEN 2-PAK 0.3 MG/0.3ML SOAJ injection   1  . fexofenadine (ALLEGRA) 180 MG tablet Take 180 mg by mouth daily.    Marland Kitchen ibuprofen (ADVIL,MOTRIN) 200 MG tablet Take 600 mg by mouth every 6 (six) hours as needed for moderate pain.    Marland Kitchen losartan (COZAAR) 100 MG tablet Take 1 tablet (100 mg total) by mouth daily. 90 tablet 1  . NASONEX 50 MCG/ACT nasal spray   3  . NON FORMULARY Allergy injections once weekly    . valACYclovir (VALTREX) 500 MG tablet Take 500 mg by mouth as needed (cold sores).      No current facility-administered medications for this visit.    Family History  Problem Relation Age of Onset  . Mitral valve prolapse Mother   . Breast cancer Paternal Aunt 57    and bile duct cancer  . Prostate cancer Father   . Transient ischemic attack Father   . Colon cancer Neg Hx   . Rectal  cancer Neg Hx   . Stomach cancer Neg Hx   . Esophageal cancer Neg Hx     ROS:  Pertinent items are noted in HPI.  Otherwise, a comprehensive ROS was negative.  Exam:   BP 140/76 mmHg  Pulse 92  Resp 16  Ht 5' 6.25" (1.683 m)  Wt 183 lb (83.008 kg)  BMI 29.31 kg/m2    Height: 5' 6.25" (168.3 cm)  Ht Readings from Last 3 Encounters:  03/16/16 5' 6.25" (1.683 m)  10/22/15 5\' 6"  (1.676 m)  04/03/15 5\' 6"  (1.676 m)   General appearance: alert, cooperative and appears stated age Head: Normocephalic, without obvious abnormality, atraumatic Neck: no adenopathy, supple, symmetrical, trachea midline and thyroid normal to inspection and palpation Lungs: clear to auscultation bilaterally Breasts: normal  appearance, no masses or tenderness Heart: regular rate and rhythm Abdomen: soft, non-tender; bowel sounds normal; no masses,  no organomegaly Extremities: extremities normal, atraumatic, no cyanosis or edema Skin: Skin color, texture, turgor normal. No rashes or lesions Lymph nodes: Cervical, supraclavicular, and axillary nodes normal. No abnormal inguinal nodes palpated Neurologic: Grossly normal   Pelvic: External genitalia:  no lesions              Urethra:  normal appearing urethra with no masses, tenderness or lesions              Bartholins and Skenes: normal                 Vagina: normal appearing vagina with normal color and discharge, no lesions              Cervix: no lesions              Pap taken: Yes.   Bimanual Exam:  Uterus:  normal size, contour, position, consistency, mobility, non-tender              Adnexa: normal adnexa and no mass, fullness, tenderness               Rectovaginal: Confirms               Anus:  normal sphincter tone, no lesions  Chaperone was present for exam.  A:  Well Woman with normal exam H/o AGUS pap 1/16 H/O menorrhagia and adenomyosis, now with amenorrhea with Mirena IUD placed 5/13 Hypertension.  H/o recurrent UTIs Allergies  P: Mammogram yearly.  Pt aware this is due.  States she will scheduled. Tdap due 8/17.  Pt states will get with PCP Hep C testing recommended.  States she will have done with PCP. Pap with HR HPV obtained today. Macrobid 100mg  prn. #30/3RF return annually or prn

## 2016-03-18 LAB — IPS PAP TEST WITH HPV

## 2016-04-03 ENCOUNTER — Encounter: Payer: Self-pay | Admitting: Family Medicine

## 2016-04-03 ENCOUNTER — Other Ambulatory Visit: Payer: Self-pay | Admitting: Family Medicine

## 2016-04-07 ENCOUNTER — Other Ambulatory Visit: Payer: Self-pay | Admitting: Family Medicine

## 2016-04-14 ENCOUNTER — Ambulatory Visit (INDEPENDENT_AMBULATORY_CARE_PROVIDER_SITE_OTHER): Payer: BC Managed Care – PPO | Admitting: Family Medicine

## 2016-04-14 ENCOUNTER — Encounter: Payer: Self-pay | Admitting: Family Medicine

## 2016-04-14 VITALS — BP 126/68 | HR 78 | Temp 97.7°F | Resp 16 | Ht 66.0 in | Wt 183.4 lb

## 2016-04-14 DIAGNOSIS — I10 Essential (primary) hypertension: Secondary | ICD-10-CM

## 2016-04-14 DIAGNOSIS — Z1159 Encounter for screening for other viral diseases: Secondary | ICD-10-CM | POA: Diagnosis not present

## 2016-04-14 LAB — BASIC METABOLIC PANEL
BUN: 15 mg/dL (ref 7–25)
CALCIUM: 9.5 mg/dL (ref 8.6–10.4)
CO2: 25 mmol/L (ref 20–31)
CREATININE: 0.58 mg/dL (ref 0.50–1.05)
Chloride: 100 mmol/L (ref 98–110)
GLUCOSE: 145 mg/dL — AB (ref 65–99)
Potassium: 3.6 mmol/L (ref 3.5–5.3)
Sodium: 137 mmol/L (ref 135–146)

## 2016-04-14 MED ORDER — LOSARTAN POTASSIUM 100 MG PO TABS: 100.0000 mg | ORAL_TABLET | Freq: Every day | ORAL | Status: DC

## 2016-04-14 MED ORDER — AMLODIPINE BESYLATE 5 MG PO TABS: 5.0000 mg | ORAL_TABLET | Freq: Every day | ORAL | Status: DC

## 2016-04-14 NOTE — Patient Instructions (Addendum)
Follow up in about 6 months- we'll do a fasting lipid panel at that time   IF you received an x-ray today, you will receive an invoice from [facility]. Please contact [facility] at [phone] with questions or concerns regarding your invoice.   IF you received labwork today, you will receive an invoice from [employer]. Please contact [facility] at [phone] with questions or concerns regarding your invoice.   Our billing staff will not be able to assist you with questions regarding bills from these companies.  You will be contacted with the lab results as soon as they are available. The fastest way to get your results is to activate your My Chart account. Instructions are located on the last page of this paperwork. If you have not heard from [place] regarding the results in 2 weeks, please contact this office.

## 2016-04-14 NOTE — Progress Notes (Signed)
   Subjective:    Patient ID: Kristina Sanders, female    DOB: 08/25/1961, 55 y.o.   MRN: [id number]  HPI This is a pleasant 55 yo female who presents today for follow up of HTN. 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 She has been taking her blood pressures at home and they run 120-138/70-80s. She is tolerating amlodipine and losartan without side effects. She is hoping to exercise more when school is out.   Past Medical History  Diagnosis Date  . HTN (hypertension)   . Allergy   . Perennial allergic rhinitis     [place]   . Seasonal allergic rhinitis     [place]  . HSV infection     h/o  . Anemia   . Ovarian cyst     h/o  . History of abnormal cervical Pap smear    Past Surgical History  Procedure Laterality Date  . Cesarean section    . Tonsillectomy and adenoidectomy    . Excision of precancerous mole     Family History  Problem Relation Age of Onset  . Mitral valve prolapse Mother   . Breast cancer Paternal Aunt 40    and bile duct cancer  . Prostate cancer Father   . Transient ischemic attack Father   . Colon cancer Neg Hx   . Rectal cancer Neg Hx   . Stomach cancer Neg Hx   . Esophageal cancer Neg Hx    Social History  Substance Use Topics  . Smoking status: Never Smoker   . Smokeless tobacco: Never Used  . Alcohol Use: 2.4 - 3.6 oz/week    4-6 Standard drinks or equivalent per week    Review of Systems No chest pain, no SOB, no edema     Objective:   Physical Exam Physical Exam  Constitutional: Oriented to person, place, and time. She appears well-developed and well-nourished.  HENT:  Head: Normocephalic and atraumatic.  Eyes: Conjunctivae are normal.  Neck: Normal range of motion. Neck supple.  Cardiovascular: Normal rate, regular rhythm and normal heart sounds.   Pulmonary/Chest: Effort normal and breath sounds normal.  Musculoskeletal: Normal range of motion.  Neurological: Alert and oriented to person, place,  and time.  Skin: Skin is warm and dry.  Psychiatric: Normal mood and affect. Behavior is normal. Judgment and thought content normal.  Vitals reviewed.  BP 145/73 mmHg  Pulse 78  Temp(Src) 97.7 F (36.5 C) (Oral)  Resp 16  Ht 5\' 6"  (1.676 m)  Wt 183 lb 6.4 oz (83.19 kg)  BMI 29.62 kg/m2 Wt Readings from Last 3 Encounters:  04/14/16 183 lb 6.4 oz (83.19 kg)  03/16/16 183 lb (83.008 kg)  10/22/15 182 lb (82.555 kg)  Recheck BP- 126/68     Assessment & Plan:  1. Essential hypertension - Basic metabolic panel - amLODipine (NORVASC) 5 MG tablet; Take 1 tablet (5 mg total) by mouth daily.  Dispense: 90 tablet; Refill: 1 - losartan (COZAAR) 100 MG tablet; Take 1 tablet (100 mg total) by mouth daily.  Dispense: 90 tablet; Refill: 1  2. Need for hepatitis C screening test - Hepatitis C antibody  - follow up in 6 months and will check fasting lipids at that time  Clarene Reamer, FNP-BC  [facility]  04/14/2016 5:15 PM

## 2016-04-15 LAB — HEPATITIS C ANTIBODY: HCV Ab: NEGATIVE

## 2016-05-16 ENCOUNTER — Other Ambulatory Visit: Payer: Self-pay | Admitting: Family Medicine

## 2016-10-02 ENCOUNTER — Ambulatory Visit (INDEPENDENT_AMBULATORY_CARE_PROVIDER_SITE_OTHER): Payer: BC Managed Care – PPO | Admitting: Osteopathic Medicine

## 2016-10-02 VITALS — BP 138/74 | HR 74 | Temp 98.7°F | Resp 18 | Ht 66.0 in | Wt 184.2 lb

## 2016-10-02 DIAGNOSIS — R0982 Postnasal drip: Secondary | ICD-10-CM | POA: Diagnosis not present

## 2016-10-02 DIAGNOSIS — J329 Chronic sinusitis, unspecified: Secondary | ICD-10-CM

## 2016-10-02 DIAGNOSIS — R05 Cough: Secondary | ICD-10-CM

## 2016-10-02 DIAGNOSIS — B9689 Other specified bacterial agents as the cause of diseases classified elsewhere: Secondary | ICD-10-CM | POA: Diagnosis not present

## 2016-10-02 DIAGNOSIS — R059 Cough, unspecified: Secondary | ICD-10-CM

## 2016-10-02 MED ORDER — AMOXICILLIN-POT CLAVULANATE 875-125 MG PO TABS: 1.0000 | ORAL_TABLET | Freq: Two times a day (BID) | ORAL | 0 refills | Status: DC

## 2016-10-02 MED ORDER — IPRATROPIUM BROMIDE 0.03 % NA SOLN: 2.0000 | Freq: Three times a day (TID) | NASAL | 0 refills | Status: DC | PRN

## 2016-10-02 MED ORDER — GUAIFENESIN-CODEINE 100-10 MG/5ML PO SYRP: 5.0000 mL | ORAL_SOLUTION | Freq: Four times a day (QID) | ORAL | 0 refills | Status: DC | PRN

## 2016-10-02 NOTE — Patient Instructions (Addendum)
Aches/Pains, Fever Acetaminophen (Tylenol) 500 mg tablets - take max 2 tablets (1000 mg) every 6 hours (4 times per day)  Ibuprofen* (Motrin) 200 mg tablets - take max 4 tablets (800 mg) every 6 hours  Sinus Congestion Cromolyn Nasal Spray (NasalCrom) 1 spray each nostril 3-4 times per day, max 6 imes per day Nasal Saline if desired Oxymetolazone (Afrin, others) sparing use due to rebound congestion, NEVER use in kids Phenylephrine PE* (Sudafed) 10 mg tablets every 4 hours (or the 12-hour formulation) Diphenhydramine (Benadryl) 25 mg tablets - take max 2 tablets every 4 hours, avoid in elderly  Cough Dextromethorphan (Robitussin, others) - cough suppressant Guaifenesin (Robitussin, Mucinex, others) - expectorant (helps cough up mucus) The above medications also come in a combination tablet Lozenges w/ Benzocaine + Menthol (Cepacol) Honey - as much as you want Teas which "coat the throat" - look for ingredients Elm Bark, Licorice Root, Marshmallow Root  Other Zinc Lozenges within 24 hours of symptoms onset - mixed evidence this shortens the duration of the common cold Don't waste your money on Vitamin C or Echinacea   *caution/limit use if you have high blood pressure or heart problems       IF you received an x-ray today, you will receive an invoice from [facility]. Please contact [facility] at [phone] with questions or concerns regarding your invoice.   IF you received labwork today, you will receive an invoice from [employer]. Please contact [facility] at [phone] with questions or concerns regarding your invoice.   Our billing staff will not be able to assist you with questions regarding bills from these companies.  You will be contacted with the lab results as soon as they are available. The fastest way to get your results is to activate your My Chart account. Instructions are located on the last page of this paperwork. If  you have not heard from [place] regarding the results in 2 weeks, please contact this office.

## 2016-10-02 NOTE — Progress Notes (Signed)
HPI: Kristina Sanders is a 55 y.o. female  who presents to [facility] today, 10/02/16,  for chief complaint of:  Chief Complaint  Patient presents with  . Facial Pain    sin. drainage x over 2 weeks  . Cough    Acute illness . Location: sinuses, mild sore throat/postnasal drip  . Quality: congestion, sinus pain . Duration: 2 weeks . Timing: constant . Modifying factors: multiple OTC medications tried to no effect . Assoc signs/symptoms: no fever    Past medical, surgical, social and family history reviewed: Past Medical History:  Diagnosis Date  . Allergy   . Anemia   . History of abnormal cervical Pap smear   . HSV infection    h/o  . HTN (hypertension)   . Ovarian cyst    h/o  . Perennial allergic rhinitis    [place]   . Seasonal allergic rhinitis    [place]   Past Surgical History:  Procedure Laterality Date  . CESAREAN SECTION    . excision of precancerous mole    . TONSILLECTOMY AND ADENOIDECTOMY     Social History  Substance Use Topics  . Smoking status: Never Smoker  . Smokeless tobacco: Never Used  . Alcohol use 2.4 - 3.6 oz/week    4 - 6 Standard drinks or equivalent per week   Family History  Problem Relation Age of Onset  . Mitral valve prolapse Mother   . Breast cancer Paternal Aunt 5    and bile duct cancer  . Prostate cancer Father   . Transient ischemic attack Father   . Colon cancer Neg Hx   . Rectal cancer Neg Hx   . Stomach cancer Neg Hx   . Esophageal cancer Neg Hx      Current medication list and allergy/intolerance information reviewed:   Current Outpatient Prescriptions  Medication Sig Dispense Refill  . amLODipine (NORVASC) 5 MG tablet Take 1 tablet (5 mg total) by mouth daily. 90 tablet 1  . EPIPEN 2-PAK 0.3 MG/0.3ML SOAJ injection   1  . fexofenadine (ALLEGRA) 180 MG tablet Take 180 mg by mouth daily.    Marland Kitchen ibuprofen (ADVIL,MOTRIN) 200 MG tablet Take 600 mg by mouth every 6 (six) hours as needed  for moderate pain.    Marland Kitchen losartan (COZAAR) 100 MG tablet Take 1 tablet (100 mg total) by mouth daily. 90 tablet 1  . nitrofurantoin, macrocrystal-monohydrate, (MACROBID) 100 MG capsule Take as directed 30 capsule 3  . valACYclovir (VALTREX) 500 MG tablet Take 500 mg by mouth as needed (cold sores).     . NON FORMULARY Allergy injections once weekly     No current facility-administered medications for this visit.    No Known Allergies    Review of Systems:  Constitutional:  No  fever, no chills, +recent illness, No unintentional weight changes. No significant fatigue.   HEENT: No  headache, no vision change, no hearing change, +sinus pressure  Cardiac: No  chest pain, No  pressure  Respiratory:  No  shortness of breath. +dry Cough  Gastrointestinal: No  abdominal pain, No  nausea,    Musculoskeletal: No new myalgia/arthralgia  Skin: No  Rash,    Exam:  BP 138/74 (BP Location: Right Arm, Patient Position: Sitting, Cuff Size: Large)   Pulse 74   Temp 98.7 F (37.1 C) (Oral)   Resp 18   Ht 5\' 6"  (1.676 m)   Wt 184 lb 3.2 oz (83.6 kg)  SpO2 98%   BMI 29.73 kg/m   Constitutional: VS see above. General Appearance: alert, well-developed, well-nourished, NAD  Eyes: Normal lids and conjunctive, non-icteric sclera  Ears, Nose, Mouth, Throat: MMM, Normal external inspection ears/nares/mouth/lips/gums. TM normal bilaterally. Pharynx/tonsils no erythema, no exudate. Nasal mucosa normal.   Neck: No masses, trachea midline. No thyroid enlargement. No tenderness/mass appreciated. No lymphadenopathy  Respiratory: Normal respiratory effort. no wheeze, no rhonchi, no rales  Cardiovascular: S1/S2 normal, no murmur, no rub/gallop auscultated. RRR.   Skin: warm, dry, intact.   Psychiatric: Normal judgment/insight. Normal mood and affect. Oriented x3.      ASSESSMENT/PLAN:    Bacterial sinusitis - Plan: amoxicillin-clavulanate (AUGMENTIN) 875-125 MG tablet  Cough - Plan:  guaiFENesin-codeine (ROBITUSSIN AC) 100-10 MG/5ML syrup  Postnasal drip - Plan: ipratropium (ATROVENT) 0.03 % nasal spray  Patient Instructions   Aches/Pains, Fever Acetaminophen (Tylenol) 500 mg tablets - take max 2 tablets (1000 mg) every 6 hours (4 times per day)  Ibuprofen* (Motrin) 200 mg tablets - take max 4 tablets (800 mg) every 6 hours  Sinus Congestion Cromolyn Nasal Spray (NasalCrom) 1 spray each nostril 3-4 times per day, max 6 imes per day Nasal Saline if desired Oxymetolazone (Afrin, others) sparing use due to rebound congestion, NEVER use in kids Phenylephrine PE* (Sudafed) 10 mg tablets every 4 hours (or the 12-hour formulation) Diphenhydramine (Benadryl) 25 mg tablets - take max 2 tablets every 4 hours, avoid in elderly  Cough Dextromethorphan (Robitussin, others) - cough suppressant Guaifenesin (Robitussin, Mucinex, others) - expectorant (helps cough up mucus) The above medications also come in a combination tablet Lozenges w/ Benzocaine + Menthol (Cepacol) Honey - as much as you want Teas which "coat the throat" - look for ingredients Elm Bark, Licorice Root, Marshmallow Root  Other Zinc Lozenges within 24 hours of symptoms onset - mixed evidence this shortens the duration of the common cold Don't waste your money on Vitamin C or Echinacea   *caution/limit use if you have high blood pressure or heart problems       IF you received an x-ray today, you will receive an invoice from [facility]. Please contact [facility] at [phone] with questions or concerns regarding your invoice.   IF you received labwork today, you will receive an invoice from [employer]. Please contact [facility] at [phone] with questions or concerns regarding your invoice.   Our billing staff will not be able to assist you with questions regarding bills from these companies.  You will be contacted with the lab results as soon as they  are available. The fastest way to get your results is to activate your My Chart account. Instructions are located on the last page of this paperwork. If you have not heard from [place] regarding the results in 2 weeks, please contact this office.            Visit summary with medication list and pertinent instructions was printed for patient to review. All questions at time of visit were answered - patient instructed to contact office with any additional concerns. ER/RTC precautions were reviewed with the patient. Follow-up plan: Return if symptoms worsen or fail to improve.

## 2016-10-27 ENCOUNTER — Other Ambulatory Visit: Payer: Self-pay | Admitting: Osteopathic Medicine

## 2016-10-27 DIAGNOSIS — R0982 Postnasal drip: Secondary | ICD-10-CM

## 2016-10-30 ENCOUNTER — Other Ambulatory Visit: Payer: Self-pay | Admitting: Family Medicine

## 2016-10-30 DIAGNOSIS — I10 Essential (primary) hypertension: Secondary | ICD-10-CM

## 2016-11-02 NOTE — Telephone Encounter (Signed)
Last ov and lab 03/2016

## 2016-11-02 NOTE — Telephone Encounter (Signed)
Patient is calling to check on her Losartan and Norvasc prescriptions.  She is a former Programme researcher, broadcasting/film/video patient and wanted to make sure that a provider has taken over her patients' paperwork.  I advised her that a provider here has been assigned to take over [facility] patients.  Patient states that [facility] has already faxed over a request.  Please be on the look out for that fax  Patient phone: [phone]

## 2016-12-03 ENCOUNTER — Ambulatory Visit: Payer: BC Managed Care – PPO

## 2016-12-05 ENCOUNTER — Other Ambulatory Visit: Payer: Self-pay | Admitting: Family Medicine

## 2016-12-05 DIAGNOSIS — I10 Essential (primary) hypertension: Secondary | ICD-10-CM

## 2016-12-09 ENCOUNTER — Encounter: Payer: Self-pay | Admitting: Physician Assistant

## 2016-12-09 ENCOUNTER — Ambulatory Visit (INDEPENDENT_AMBULATORY_CARE_PROVIDER_SITE_OTHER): Payer: BC Managed Care – PPO | Admitting: Physician Assistant

## 2016-12-09 VITALS — BP 124/74 | HR 80 | Temp 98.7°F | Resp 18 | Ht 66.0 in | Wt 187.0 lb

## 2016-12-09 DIAGNOSIS — J309 Allergic rhinitis, unspecified: Secondary | ICD-10-CM | POA: Diagnosis not present

## 2016-12-09 DIAGNOSIS — I10 Essential (primary) hypertension: Secondary | ICD-10-CM

## 2016-12-09 MED ORDER — FLUTICASONE PROPIONATE 50 MCG/ACT NA SUSP: 2.0000 | Freq: Every day | NASAL | 6 refills | Status: DC

## 2016-12-09 MED ORDER — LOSARTAN POTASSIUM 100 MG PO TABS: 100.0000 mg | ORAL_TABLET | Freq: Every day | ORAL | 1 refills | Status: DC

## 2016-12-09 MED ORDER — AMLODIPINE BESYLATE 5 MG PO TABS: 5.0000 mg | ORAL_TABLET | Freq: Every day | ORAL | 1 refills | Status: DC

## 2016-12-09 NOTE — Patient Instructions (Addendum)
For high blood pressure, continue taking medication daily. Follow up in 3 months for annual physical exam and bp reevaluation.   It was a pleasure meeting you today, we will contact you with your lab results.   Thank you for letting me participate in your health and well being.   IF you received an x-ray today, you will receive an invoice from [facility]. Please contact [facility] at [phone] with questions or concerns regarding your invoice.   IF you received labwork today, you will receive an invoice from [facility]. Please contact [facility] at [phone] with questions or concerns regarding your invoice.   Our billing staff will not be able to assist you with questions regarding bills from these companies.  You will be contacted with the lab results as soon as they are available. The fastest way to get your results is to activate your My Chart account. Instructions are located on the last page of this paperwork. If you have not heard from [place] regarding the results in 2 weeks, please contact this office.

## 2016-12-09 NOTE — Progress Notes (Signed)
    MRN: [id number] DOB: 1961/06/26  Subjective:   Kristina Sanders is a 56 y.o. female presenting for follow up on Hypertension. Last seen in our clinic for HTN on 04/14/16 by NP Carlean Purl. She would like to find another provider since NP Carlean Purl is no longer here.   Currently managed with amlodipine '5mg'$  and losartan '100mg'$  daily. Patient is checking blood pressure at home, range is [phone] systolic. Denies lightheadedness, dizziness, chronic headache, double vision, chest pain, shortness of breath, heart racing, palpitations, nausea, vomiting, abdominal pain, hematuria, lower leg swelling. Denies smoking. Occasional alcohol use. Denies any other aggravating or relieving factors, no other questions or concerns.  Karoline has a current medication list which includes the following prescription(s): amlodipine, epipen 2-pak, fexofenadine, ibuprofen, ipratropium, losartan, NON FORMULARY, valacyclovir, and nitrofurantoin (macrocrystal-monohydrate). Also has No Known Allergies.  Aritha  has a past medical history of Allergy; Anemia; History of abnormal cervical Pap smear; HSV infection; HTN (hypertension); Ovarian cyst; Perennial allergic rhinitis; and Seasonal allergic rhinitis. Also  has a past surgical history that includes Cesarean section; Tonsillectomy and adenoidectomy; and excision of precancerous mole.   Objective:   Vitals: BP 124/74 (BP Location: Right Arm, Patient Position: Sitting, Cuff Size: Small)   Pulse 80   Temp 98.7 F (37.1 C) (Oral)   Resp 18   Ht '5\' 6"'$  (1.676 m)   Wt 187 lb (84.8 kg)   SpO2 100%   BMI 30.18 kg/m   Physical Exam  Constitutional: She is oriented to person, place, and time. She appears well-developed and well-nourished.  HENT:  Head: Normocephalic and atraumatic.  Right Ear: Tympanic membrane, external ear and ear canal normal.  Left Ear: External ear and ear canal normal. Tympanic membrane is retracted.  Nose: Mucosal edema (more prominent on left) present.    Mouth/Throat: Uvula is midline, oropharynx is clear and moist and mucous membranes are normal.  Eyes: Conjunctivae are normal.  Neck: Normal range of motion.  Cardiovascular: Normal rate, regular rhythm and normal heart sounds.   Pulmonary/Chest: Effort normal and breath sounds normal.  Neurological: She is alert and oriented to person, place, and time.  Skin: Skin is warm and dry.  Psychiatric: She has a normal mood and affect.  Vitals reviewed.  No results found for this or any previous visit (from the past 24 hour(s)).  Assessment and Plan :  1. Essential hypertension Labs pending, controlled in office today. Instructed to follow up with me in 3 months for annual physical exam and bp reevaluation.  - CMP14+EGFR - losartan (COZAAR) 100 MG tablet; Take 1 tablet (100 mg total) by mouth daily.  Dispense: 90 tablet; Refill: 1 - amLODipine (NORVASC) 5 MG tablet; Take 1 tablet (5 mg total) by mouth daily.  Dispense: 90 tablet; Refill: 1  2. Allergic rhinitis, unspecified chronicity, unspecified seasonality, unspecified trigger -Instructed to try flonase for nasal congestion and TM retraction. Will reevaluate in 3 months.  - fluticasone (FLONASE) 50 MCG/ACT nasal spray; Place 2 sprays into both nostrils daily.  Dispense: 16 g; Refill: [place], PA-C  [facility] 12/09/2016 4:55 PM

## 2016-12-10 LAB — CMP14+EGFR
A/G RATIO: 2.1 (ref 1.2–2.2)
ALT: 25 IU/L (ref 0–32)
AST: 19 IU/L (ref 0–40)
Albumin: 4.7 g/dL (ref 3.5–5.5)
Alkaline Phosphatase: 64 IU/L (ref 39–117)
BILIRUBIN TOTAL: 0.5 mg/dL (ref 0.0–1.2)
BUN/Creatinine Ratio: 31 — ABNORMAL HIGH (ref 9–23)
BUN: 14 mg/dL (ref 6–24)
CALCIUM: 9.9 mg/dL (ref 8.7–10.2)
CO2: 27 mmol/L (ref 18–29)
Chloride: 99 mmol/L (ref 96–106)
Creatinine, Ser: 0.45 mg/dL — ABNORMAL LOW (ref 0.57–1.00)
GFR calc Af Amer: 130 mL/min/{1.73_m2} (ref >59)
GFR, EST NON AFRICAN AMERICAN: 113 mL/min/{1.73_m2} (ref >59)
GLOBULIN, TOTAL: 2.2 g/dL (ref 1.5–4.5)
Glucose: 100 mg/dL — ABNORMAL HIGH (ref 65–99)
POTASSIUM: 4.2 mmol/L (ref 3.5–5.2)
SODIUM: 141 mmol/L (ref 134–144)
Total Protein: 6.9 g/dL (ref 6.0–8.5)

## 2017-02-16 ENCOUNTER — Other Ambulatory Visit: Payer: Self-pay | Admitting: Obstetrics & Gynecology

## 2017-02-16 ENCOUNTER — Telehealth: Payer: Self-pay

## 2017-02-16 DIAGNOSIS — N912 Amenorrhea, unspecified: Secondary | ICD-10-CM

## 2017-02-16 NOTE — Telephone Encounter (Signed)
Call to patient. Lab appointment for [facility] level check scheduled for 02/26/2017. Need to see if patient needs IUD removal or removal and reinsertion based on lab result per Dr.Miller. Will close encounter.

## 2017-02-26 ENCOUNTER — Other Ambulatory Visit (INDEPENDENT_AMBULATORY_CARE_PROVIDER_SITE_OTHER): Payer: BC Managed Care – PPO

## 2017-02-26 DIAGNOSIS — N912 Amenorrhea, unspecified: Secondary | ICD-10-CM

## 2017-02-27 LAB — FOLLICLE STIMULATING HORMONE: FSH: 58 m[IU]/mL

## 2017-03-01 ENCOUNTER — Telehealth: Payer: Self-pay

## 2017-03-01 DIAGNOSIS — Z30432 Encounter for removal of intrauterine contraceptive device: Secondary | ICD-10-CM

## 2017-03-01 NOTE — Telephone Encounter (Signed)
Spoke with patient. Advised of results as seen below from [place]. Patient verbalizes understanding. Order for IUD removal placed. Will keep appointment for aex and IUD removal on 03/22/2017.  Routing to provider for final review. Patient agreeable to disposition. Will close encounter.

## 2017-03-01 NOTE — Telephone Encounter (Signed)
-----   Message from Megan Salon, MD sent at 02/28/2017  3:21 PM EDT ----- Please let pt know her [facility] is still elevated.  Ok to just remove the IUD and not replace a new one.  Will plan to remove in May at her [facility].

## 2017-03-17 ENCOUNTER — Telehealth: Payer: Self-pay | Admitting: Obstetrics & Gynecology

## 2017-03-17 NOTE — Telephone Encounter (Signed)
Patient is scheduled Monday for IUD removal.  Wants to know if Dr Sabra Heck will give her something to make her more relaxed.

## 2017-03-17 NOTE — Telephone Encounter (Signed)
Dr. Sabra Heck, please review patient message below and advise on RX?

## 2017-03-18 ENCOUNTER — Other Ambulatory Visit: Payer: Self-pay | Admitting: Obstetrics & Gynecology

## 2017-03-18 MED ORDER — DIAZEPAM 10 MG PO TABS: ORAL_TABLET | ORAL | 0 refills | Status: DC

## 2017-03-18 NOTE — Telephone Encounter (Signed)
OK to use valium 5mg  1 hour before procedure.  Rx on your desk for faxing tomorrow.  She will need a driver.  Please let pt know.

## 2017-03-19 NOTE — Telephone Encounter (Signed)
Spoke with patient. Patient will come to the office now to sign consent and pick up rx for Valium.

## 2017-03-19 NOTE — Telephone Encounter (Signed)
Patient stopped by the office today. Consent form signed and to the front in the records drawer for patient's appointment. Rx for Valium given to the patient for her to fill at her pharmacy. Aware she will need a driver for her appointment.  Routing to provider for final review. Patient agreeable to disposition. Will close encounter.

## 2017-03-19 NOTE — Telephone Encounter (Addendum)
Left message to call Sharee Pimple at [phone].  Needs to sign procedure consent. Scheduled for 03/22/17 at 4pm.

## 2017-03-22 ENCOUNTER — Other Ambulatory Visit (HOSPITAL_COMMUNITY)
Admission: RE | Admit: 2017-03-22 | Discharge: 2017-03-22 | Disposition: A | Discharge status: Home / Self Care | Payer: BC Managed Care – PPO | Source: Ambulatory Visit | Attending: Obstetrics & Gynecology | Admitting: Obstetrics & Gynecology

## 2017-03-22 ENCOUNTER — Ambulatory Visit (INDEPENDENT_AMBULATORY_CARE_PROVIDER_SITE_OTHER): Payer: BC Managed Care – PPO | Admitting: Obstetrics & Gynecology

## 2017-03-22 ENCOUNTER — Encounter: Payer: Self-pay | Admitting: Obstetrics & Gynecology

## 2017-03-22 VITALS — BP 130/60 | HR 86 | Resp 16 | Ht 65.5 in | Wt 180.0 lb

## 2017-03-22 DIAGNOSIS — Z124 Encounter for screening for malignant neoplasm of cervix: Secondary | ICD-10-CM | POA: Diagnosis not present

## 2017-03-22 DIAGNOSIS — Z01419 Encounter for gynecological examination (general) (routine) without abnormal findings: Secondary | ICD-10-CM | POA: Diagnosis not present

## 2017-03-22 DIAGNOSIS — Z30432 Encounter for removal of intrauterine contraceptive device: Secondary | ICD-10-CM | POA: Diagnosis not present

## 2017-03-22 MED ORDER — NITROFURANTOIN MONOHYD MACRO 100 MG PO CAPS: ORAL_CAPSULE | ORAL | 3 refills | Status: DC

## 2017-03-22 NOTE — Progress Notes (Signed)
56 y.o. [id number] MarriedCaucasianF here for annual exam.  Doing well.  Denies vaginal bleeding.  Having IUD removed today.  [place] 58 02/26/17.    No LMP recorded. Patient is not currently having periods (Reason: IUD).          Sexually active: Yes.    The current method of family planning is vasectomy.    Exercising: Yes.    Walking Smoker:  no  Health Maintenance: Pap:  03/16/16 Neg. HR HPV:Neg   05/02/15 Neg  History of abnormal Pap:  Yes, 12/10/14 Atypical Endo cells  MMG:  01/09/15 BIRADS2:benign. Patient will schedule  Colonoscopy:  02/22/15 normal. f/u 10 years  BMD:   Never TDaP:  2007 Pneumonia vaccine(s):  No Zostavax:   No Hep C testing: 04/14/16 Neg  Screening Labs: with PCP   reports that she has never smoked. She has never used smokeless tobacco. She reports that she drinks about 2.4 - 3.6 oz of alcohol per week . She reports that she does not use drugs.  Past Medical History:  Diagnosis Date  . Allergy   . Anemia   . History of abnormal cervical Pap smear   . HSV infection    h/o  . HTN (hypertension)   . Ovarian cyst    h/o  . Perennial allergic rhinitis    [place]   . Seasonal allergic rhinitis    [place]    Past Surgical History:  Procedure Laterality Date  . CESAREAN SECTION    . excision of precancerous mole    . TONSILLECTOMY AND ADENOIDECTOMY      Current Outpatient Prescriptions  Medication Sig Dispense Refill  . amLODipine (NORVASC) 5 MG tablet Take 1 tablet (5 mg total) by mouth daily. 90 tablet 1  . diazepam (VALIUM) 10 MG tablet 1 tab po 1 hour before procedure 5 tablet 0  . EPIPEN 2-PAK 0.3 MG/0.3ML SOAJ injection   1  . fexofenadine (ALLEGRA) 180 MG tablet Take 180 mg by mouth daily.    . fluticasone (FLONASE) 50 MCG/ACT nasal spray Place 2 sprays into both nostrils daily. 16 g 6  . ibuprofen (ADVIL,MOTRIN) 200 MG tablet Take 600 mg by mouth every 6 (six) hours as needed for moderate pain.    Marland Kitchen ipratropium (ATROVENT) 0.03 % nasal spray Place 2  sprays into both nostrils 3 (three) times daily as needed for rhinitis (nasal drainage). 30 mL 0  . losartan (COZAAR) 100 MG tablet Take 1 tablet (100 mg total) by mouth daily. 90 tablet 1  . NON FORMULARY Allergy injections once weekly    . valACYclovir (VALTREX) 500 MG tablet Take 500 mg by mouth as needed (cold sores).     . nitrofurantoin, macrocrystal-monohydrate, (MACROBID) 100 MG capsule Take as directed (Patient not taking: Reported on 03/22/2017) 30 capsule 3   No current facility-administered medications for this visit.     Family History  Problem Relation Age of Onset  . Mitral valve prolapse Mother   . Prostate cancer Father   . Transient ischemic attack Father   . Breast cancer Paternal Aunt 52    and bile duct cancer  . Colon cancer Neg Hx   . Rectal cancer Neg Hx   . Stomach cancer Neg Hx   . Esophageal cancer Neg Hx     ROS:  Pertinent items are noted in HPI.  Otherwise, a comprehensive ROS was negative.  Exam:   BP 130/60 (BP Location: Right Arm, Patient Position: Sitting, Cuff Size: Large)  Pulse 86   Resp 16   Ht 5' 5.5" (1.664 m)   Wt 180 lb (81.6 kg)   BMI 29.50 kg/m   Weight change: -3#  Height: 5' 5.5" (166.4 cm)  Ht Readings from Last 3 Encounters:  03/22/17 5' 5.5" (1.664 m)  12/09/16 5\' 6"  (1.676 m)  10/02/16 5\' 6"  (1.676 m)    General appearance: alert, cooperative and appears stated age Head: Normocephalic, without obvious abnormality, atraumatic Neck: no adenopathy, supple, symmetrical, trachea midline and thyroid normal to inspection and palpation Lungs: clear to auscultation bilaterally Breasts: normal appearance, no masses or tenderness Heart: regular rate and rhythm Abdomen: soft, non-tender; bowel sounds normal; no masses,  no organomegaly Extremities: extremities normal, atraumatic, no cyanosis or edema Skin: Skin color, texture, turgor normal. No rashes or lesions Lymph nodes: Cervical, supraclavicular, and axillary nodes normal. No  abnormal inguinal nodes palpated Neurologic: Grossly normal   Pelvic: External genitalia:  no lesions              Urethra:  normal appearing urethra with no masses, tenderness or lesions              Bartholins and Skenes: normal                 Vagina: normal appearing vagina with normal color and discharge, no lesions              Cervix: no lesions , IUD string noted              Pap taken: Yes.   Bimanual Exam:  Uterus:  normal size, contour, position, consistency, mobility, non-tender              Adnexa: normal adnexa and no mass, fullness, tenderness               Rectovaginal: Confirms               Anus:  normal sphincter tone, no lesions  Procedure:  Consent obtained.  Speculum placed.  IUD string noted and with one pull with ringed forceps, Mirena IUD was removed.  Pt visualized before discarding.  Pt tolerated procedure well.  No bleeding noted.  Speculum removed.  Chaperone was present for exam.  A:  Well Woman with normal exam Pap with atypical endometrial cells 1/16.  Evaluation was negative. IUD removal due.  [place] in menopausal range. Hypertension H/O recurrent UTIs Prediabetes  P:   Mammogram guidelines reviewed.  Pt aware this is due and states she will call to schedule pap smear obtained.  Neg HR HPV 2017. Mirena IUD removed today Pt aware I recommend follow up blood.  Declines to do today.  States she will follow up with PCP this summer. Macrobid 100mg  prn.  #30/3RF Tdap needs updating.  Pt consented to this but left before having done.  Will contact her to return for update or can do next year. return annually or prn

## 2017-03-23 LAB — CYTOLOGY - PAP: Diagnosis: NEGATIVE

## 2017-04-19 ENCOUNTER — Other Ambulatory Visit: Payer: Self-pay | Admitting: Radiology

## 2017-04-21 ENCOUNTER — Telehealth: Payer: Self-pay | Admitting: *Deleted

## 2017-04-21 NOTE — Telephone Encounter (Signed)
Left vm regarding [place] for 6.13.18. Contact information provided.

## 2017-04-22 ENCOUNTER — Telehealth: Payer: Self-pay | Admitting: *Deleted

## 2017-04-22 ENCOUNTER — Encounter: Payer: Self-pay | Admitting: *Deleted

## 2017-04-22 DIAGNOSIS — Z170 Estrogen receptor positive status [ER+]: Secondary | ICD-10-CM

## 2017-04-22 DIAGNOSIS — C50212 Malignant neoplasm of upper-inner quadrant of left female breast: Secondary | ICD-10-CM

## 2017-04-22 HISTORY — DX: Estrogen receptor positive status (ER+): C50.212

## 2017-04-22 HISTORY — DX: Malignant neoplasm of upper-inner quadrant of left female breast: Z17.0

## 2017-04-22 NOTE — Telephone Encounter (Signed)
Confirmed [facility] for 04/28/17 at 0815 .  Instructions and contact information given.

## 2017-04-28 ENCOUNTER — Other Ambulatory Visit (HOSPITAL_BASED_OUTPATIENT_CLINIC_OR_DEPARTMENT_OTHER): Payer: BC Managed Care – PPO

## 2017-04-28 ENCOUNTER — Ambulatory Visit: Payer: BC Managed Care – PPO | Attending: General Surgery | Admitting: Physical Therapy

## 2017-04-28 ENCOUNTER — Ambulatory Visit: Payer: Self-pay | Admitting: General Surgery

## 2017-04-28 ENCOUNTER — Encounter: Payer: Self-pay | Admitting: Hematology

## 2017-04-28 ENCOUNTER — Encounter: Payer: Self-pay | Admitting: *Deleted

## 2017-04-28 ENCOUNTER — Ambulatory Visit
Admission: RE | Admit: 2017-04-28 | Discharge: 2017-04-28 | Disposition: A | Discharge status: Home / Self Care | Payer: BC Managed Care – PPO | Source: Ambulatory Visit | Attending: Radiation Oncology | Admitting: Radiation Oncology

## 2017-04-28 ENCOUNTER — Encounter: Payer: Self-pay | Admitting: Physical Therapy

## 2017-04-28 ENCOUNTER — Ambulatory Visit (HOSPITAL_BASED_OUTPATIENT_CLINIC_OR_DEPARTMENT_OTHER): Payer: BC Managed Care – PPO | Admitting: Hematology

## 2017-04-28 VITALS — BP 144/68 | HR 74 | Temp 97.9°F | Resp 19 | Ht 65.3 in | Wt 176.0 lb

## 2017-04-28 DIAGNOSIS — C50212 Malignant neoplasm of upper-inner quadrant of left female breast: Secondary | ICD-10-CM

## 2017-04-28 DIAGNOSIS — Z170 Estrogen receptor positive status [ER+]: Secondary | ICD-10-CM

## 2017-04-28 DIAGNOSIS — R293 Abnormal posture: Secondary | ICD-10-CM | POA: Diagnosis present

## 2017-04-28 DIAGNOSIS — Z510 Encounter for antineoplastic radiation therapy: Secondary | ICD-10-CM | POA: Insufficient documentation

## 2017-04-28 LAB — COMPREHENSIVE METABOLIC PANEL
ALBUMIN: 4.2 g/dL (ref 3.5–5.0)
ALK PHOS: 68 U/L (ref 40–150)
ALT: 21 U/L (ref 0–55)
AST: 21 U/L (ref 5–34)
Anion Gap: 10 mEq/L (ref 3–11)
BILIRUBIN TOTAL: 0.58 mg/dL (ref 0.20–1.20)
BUN: 14.7 mg/dL (ref 7.0–26.0)
CALCIUM: 9.7 mg/dL (ref 8.4–10.4)
CO2: 27 mEq/L (ref 22–29)
CREATININE: 0.7 mg/dL (ref 0.6–1.1)
Chloride: 104 mEq/L (ref 98–109)
EGFR: >90 mL/min/{1.73_m2} (ref >90)
GLUCOSE: 109 mg/dL (ref 70–140)
Potassium: 3.7 mEq/L (ref 3.5–5.1)
SODIUM: 140 meq/L (ref 136–145)
TOTAL PROTEIN: 7 g/dL (ref 6.4–8.3)

## 2017-04-28 LAB — CBC WITH DIFFERENTIAL/PLATELET
BASO%: 0.3 % (ref 0.0–2.0)
Basophils Absolute: 0 10*3/uL (ref 0.0–0.1)
EOS%: 2.5 % (ref 0.0–7.0)
Eosinophils Absolute: 0.2 10*3/uL (ref 0.0–0.5)
HEMATOCRIT: 39.1 % (ref 34.8–46.6)
HEMOGLOBIN: 12.8 g/dL (ref 11.6–15.9)
LYMPH#: 1.6 10*3/uL (ref 0.9–3.3)
LYMPH%: 25.2 % (ref 14.0–49.7)
MCH: 31.5 pg (ref 25.1–34.0)
MCHC: 32.7 g/dL (ref 31.5–36.0)
MCV: 96.3 fL (ref 79.5–101.0)
MONO#: 0.5 10*3/uL (ref 0.1–0.9)
MONO%: 7.8 % (ref 0.0–14.0)
NEUT%: 64.2 % (ref 38.4–76.8)
NEUTROS ABS: 4.1 10*3/uL (ref 1.5–6.5)
Platelets: 225 10*3/uL (ref 145–400)
RBC: 4.06 10*6/uL (ref 3.70–5.45)
RDW: 12 % (ref 11.2–14.5)
WBC: 6.3 10*3/uL (ref 3.9–10.3)

## 2017-04-28 NOTE — Progress Notes (Signed)
[place]  Telephone:(336) [phone] Fax:([phone]) [place] Note   Patient Care Team: Donzetta Kohut as PCP - General (Physician Assistant) Megan Salon, MD as Consulting Physician (Gynecology) Harold Hedge, Darrick Grinder, MD (Allergy and Immunology) Truitt Merle, MD as Consulting Physician (Hematology) Jovita Kussmaul, MD as Consulting Physician (General Surgery) Eppie Gibson, MD as Attending Physician (Radiation Oncology) 04/28/2017  CHIEF COMPLAINTS/PURPOSE OF CONSULTATION:  Recent diagnosis of a Malignant neoplasm of upper-inner quadrant of left breast in female, estrogen receptor positive.   Oncology History   Cancer Staging Malignant neoplasm of upper-inner quadrant of left breast in female, estrogen receptor positive ([place]) Staging form: Breast, AJCC 8th Edition - Clinical stage from 04/19/2017: Stage IA (cT1b, cN0, cM0, G2, ER: Positive, PR: Positive, HER2: Negative) - Signed by Truitt Merle, MD on 04/28/2017       Malignant neoplasm of upper-inner quadrant of left breast in female, estrogen receptor positive ([place])   04/19/2017 Receptors her2    Estrogen Receptor: 100%, POSITIVE, STRONG STAINING INTENSITY Progesterone Receptor: 100%, POSITIVE, STRONG STAINING INTENSITY Proliferation Marker Ki67: 30% HER2 (-)      04/19/2017 Initial Biopsy    Breast, left, needle core biopsy INVASIVE DUCTAL CARCINOMA, GRADE 1 DUCTAL CARCINOMA IN SITU, GRADE 1      04/22/2017 Initial Diagnosis    Malignant neoplasm of upper-inner quadrant of left breast in female, estrogen receptor positive ([place])      Mammogram          HISTORY OF PRESENTING ILLNESS: 04/28/17 Kristina Sanders 56 y.o. female is here because of recent diagnosis of a Malignant neoplasm of upper-inner quadrant of left breast in female, estrogen receptor positive. She presents to the Multidisciplinary breast clinic today with her husband.  In the past she normally gets a mammogram screening  but missed 2016. She did not feel any lump or had an abnormal mammogram. Her HTN is controlled. She had her 1st child at 68 years old. Due to menorrhagia she was using mirena for 5 years. She had her mirena removed and was diagnosed as postmenopausal.    She reports to purposefully loosing weight because she is pre-diabetic and feels no other change in her body. Sometimes she gets cold sores.     GYN HISTORY  Menarchal: 12 LMP: in over 5 years due to Contraception  Contraceptive: mirena for 5 years  HRT: mirena was removed in 03/2017 G3P3    MEDICAL HISTORY:  Past Medical History:  Diagnosis Date  . Allergy   . Anemia   . History of abnormal cervical Pap smear   . HSV infection    h/o  . HTN (hypertension)   . Malignant neoplasm of upper-inner quadrant of left breast in female, estrogen receptor positive ([place]) 04/22/2017  . Ovarian cyst    h/o  . Perennial allergic rhinitis    [place]   . Seasonal allergic rhinitis    [place]    SURGICAL HISTORY: Past Surgical History:  Procedure Laterality Date  . CESAREAN SECTION    . excision of precancerous mole    . TONSILLECTOMY AND ADENOIDECTOMY      SOCIAL HISTORY: Social History   Social History  . Marital status: Married    Spouse name: N/A  . Number of children: N/A  . Years of education: N/A   Occupational History  . Not on file.   Social History Main Topics  . Smoking status: Never Smoker  . Smokeless tobacco: Never Used  .  Alcohol use 2.4 - 3.6 oz/week    4 - 6 Standard drinks or equivalent per week     Comment: moderate drinker  . Drug use: No  . Sexual activity: Yes    Partners: Male    Birth control/ protection: Other-see comments     Comment: vasectomy   Other Topics Concern  . Not on file   Social History Narrative  . No narrative on file    FAMILY HISTORY: Family History  Problem Relation Age of Onset  . Mitral valve prolapse Mother   . Prostate cancer Father   . Transient ischemic attack  Father   . Breast cancer Paternal Aunt 30       bile duct cancer  . Colon cancer Neg Hx   . Rectal cancer Neg Hx   . Stomach cancer Neg Hx   . Esophageal cancer Neg Hx     ALLERGIES:  has No Known Allergies.  MEDICATIONS:  Current Outpatient Prescriptions  Medication Sig Dispense Refill  . amLODipine (NORVASC) 5 MG tablet Take 1 tablet (5 mg total) by mouth daily. 90 tablet 1  . fexofenadine (ALLEGRA) 180 MG tablet Take 180 mg by mouth daily.    Marland Kitchen losartan (COZAAR) 100 MG tablet Take 1 tablet (100 mg total) by mouth daily. 90 tablet 1  . NON FORMULARY Allergy injections once weekly    . diazepam (VALIUM) 10 MG tablet 1 tab po 1 hour before procedure (Patient not taking: Reported on 04/28/2017) 5 tablet 0  . EPIPEN 2-PAK 0.3 MG/0.3ML SOAJ injection   1  . fluticasone (FLONASE) 50 MCG/ACT nasal spray Place 2 sprays into both nostrils daily. (Patient not taking: Reported on 04/28/2017) 16 g 6  . ibuprofen (ADVIL,MOTRIN) 200 MG tablet Take 600 mg by mouth every 6 (six) hours as needed for moderate pain.    Marland Kitchen ipratropium (ATROVENT) 0.03 % nasal spray Place 2 sprays into both nostrils 3 (three) times daily as needed for rhinitis (nasal drainage). 30 mL 0  . nitrofurantoin, macrocrystal-monohydrate, (MACROBID) 100 MG capsule Take as directed 30 capsule 3  . valACYclovir (VALTREX) 500 MG tablet Take 500 mg by mouth as needed (cold sores).      No current facility-administered medications for this visit.     REVIEW OF SYSTEMS:  Constitutional: Denies fevers, chills or abnormal night sweats (+) purposeful weight loss Eyes: Denies blurriness of vision, double vision or watery eyes Ears, nose, mouth, throat, and face: Denies mucositis or sore throat Respiratory: Denies cough, dyspnea or wheezes Cardiovascular: Denies palpitation, chest discomfort or lower extremity swelling Gastrointestinal:  Denies nausea, heartburn or change in bowel habits Skin: Denies abnormal skin rashes Lymphatics:  Denies new lymphadenopathy or easy bruising Neurological:Denies numbness, tingling or new weaknesses Behavioral/Psych: Mood is stable, no new changes  All other systems were reviewed with the patient and are negative.  PHYSICAL EXAMINATION: ECOG PERFORMANCE STATUS: 0 - Asymptomatic  Vitals:   04/28/17 0857  BP: (!) 144/68  Pulse: 74  Resp: 19  Temp: 97.9 F (36.6 C)   Filed Weights   04/28/17 0857  Weight: 176 lb (79.8 kg)    GENERAL:alert, no distress and comfortable SKIN: skin color, texture, turgor are normal, no rashes or significant lesions EYES: normal, conjunctiva are pink and non-injected, sclera clear OROPHARYNX:no exudate, no erythema and lips, buccal mucosa, and tongue normal  NECK: supple, thyroid normal size, non-tender, without nodularity LYMPH:  no palpable lymphadenopathy in the cervical, axillary or inguinal LUNGS: clear to auscultation and  percussion with normal breathing effort HEART: regular rate & rhythm and no murmurs and no lower extremity edema ABDOMEN:abdomen soft, non-tender and normal bowel sounds Musculoskeletal:no cyanosis of digits and no clubbing  PSYCH: alert & oriented x 3 with fluent speech NEURO: no focal motor/sensory deficits Breasts: Breast inspection showed them to be symmetrical with no nipple discharge. Palpation of the breasts and axilla revealed no obvious mass that I could appreciate. (+) Bruise, non tender in UIQ of the left breast. Mass too small, not palpable    LABORATORY DATA:  I have reviewed the data as listed CBC Latest Ref Rng & Units 04/28/2017 07/29/2014 11/13/2013  WBC 3.9 - 10.3 10e3/uL 6.3 - -  Hemoglobin 11.6 - 15.9 g/dL 12.8 11.9(L) 12.3  Hematocrit 34.8 - 46.6 % 39.1 35.0(L) -  Platelets 145 - 400 10e3/uL 225 - -    CMP Latest Ref Rng & Units 04/28/2017 12/09/2016 04/14/2016  Glucose 70 - 140 mg/dl 109 100(H) 145(H)  BUN 7.0 - 26.0 mg/dL 14.7 14 15   Creatinine 0.6 - 1.1 mg/dL 0.7 0.45(L) 0.58  Sodium 136 - 145  mEq/L 140 141 137  Potassium 3.5 - 5.1 mEq/L 3.7 4.2 3.6  Chloride 96 - 106 mmol/L - 99 100  CO2 22 - 29 mEq/L 27 27 25   Calcium 8.4 - 10.4 mg/dL 9.7 9.9 9.5  Total Protein 6.4 - 8.3 g/dL 7.0 6.9 -  Total Bilirubin 0.20 - 1.20 mg/dL 0.58 0.5 -  Alkaline Phos 40 - 150 U/L 68 64 -  AST 5 - 34 U/L 21 19 -  ALT 0 - 55 U/L 21 25 -   PATHOLOGY  Breast, left, needle core biopsy 04/19/17 INVASIVE DUCTAL CARCINOMA, GRADE 1 DUCTAL CARCINOMA IN SITU, GRADE 1 Estrogen Receptor: 100%, POSITIVE, STRONG STAINING INTENSITY Progesterone Receptor: 100%, POSITIVE, STRONG STAINING INTENSITY Proliferation Marker Ki67: 30% HER2 (-)  RADIOGRAPHIC STUDIES: I have personally reviewed the radiological images as listed and agreed with the findings in the report. No results found.  Diagnostic mammogram and ultrasound of left breast on 04/15/2017 Impression; The new 1.2 cm oval solid mass in the left breast 11:00 position, posterior depth, is highly suspicious for malignancy. Biopsy is recommended. Ultrasound of the left axilla was negative for adenopathy.  ASSESSMENT & PLAN:  Kristina Sanders is a 55 y.o. caucasian female who has a history of Anemia, HTN, and Ovarian cycts.   1.  Malignant neoplasm of upper-inner quadrant of left breast in female, cT1bN0M0, stage IA, ER+/PR+/HER2-,  grade 1-2 --We discussed her imaging findings and the biopsy results in great details. -Giving the early stage disease, she is likely a candidate for breast conservation.  She will be seen by Dr. Marlou Starks  today and likely will proceed with surgery soon.  - the counter final size and grade of her breast tumor of surgical pathology, I will recommend a Oncotype Dx test on the surgical sample  if it is greater than 1 cm, or greater than 5 mm with grade 2-3 , and we'll make a decision about adjuvant chemotherapy based on the Oncotype result. Written material of this test was given to her. She is young and fit, would be a good candidate  for chemotherapy if her Oncotype recurrence score is high. -If her surgical sentinel lymph node node positive, I recommend mammaprint for further risk stratification and guide adjuvant chemotherapy. -Giving the strong ER and PR expression, I recommend adjuvant endocrine therapy with tamoxifen or aromatase inhibitor for a total of 5-10 years  to reduce the risk of cancer recurrence, depends on her menopause status. Potential benefits and side effects were discussed with patient and she is interested. -I'll check her [place] level on her next visit to see if she is truly post menopause. -I recommend her to avoid any estrogen or progesterone containing supplements, her melena has been removed last month. -She was also seen by radiation oncologist Dr. Isidore Moos today. she would benefit from adjuvant breast radiation after lumpectomy. -We also discussed the breast cancer surveillance after her surgery. She will continue annual screening mammogram, self exam, and a routine office visit with lab and exam with [place]. -I encouraged her to have healthy diet and exercise regularly.   2. HTN -Continue medications and follow-up of his PCP  Plan -She will likely have lumpectomy  and a sentinel lymph node biopsy soon -We'll send Oncotype on surgical sample if tumor was a 1 cm, or was in 5 mm with grade 2 or 3 , or mammaprint if node positive  -I plan to see her back after she completes radiation, will soon if her Oncotype shows high-risk disease.  -I'll check her [facility] and asked to level on her next visit.   No orders of the defined types were placed in this encounter.   All questions were answered. The patient knows to call the clinic with any problems, questions or concerns. I spent 55 minutes counseling the patient face to face. The total time spent in the appointment was 60 minutes and more than 50% was on counseling.  This document serves as a record of services personally performed by Truitt Merle, MD. It was created  on her behalf by Joslyn Devon, a trained medical scribe. The creation of this record is based on the scribe's personal observations and the provider's statements to them. This document has been checked and approved by the attending provider.      Truitt Merle, MD 04/28/2017 2:31 PM

## 2017-04-28 NOTE — Patient Instructions (Signed)

## 2017-04-28 NOTE — Progress Notes (Signed)
Nutrition Assessment  Reason for Assessment:  Pt seen in Breast Clinic  ASSESSMENT:   56 year old female with new diagnosis of breast cancer.  Past medical history reviewed  Patient reports normal appetite  Medications:  reviewed  Labs: reviewed  Anthropometrics:   Height: 65 inches Weight: 176 lb BMI: 29.1   NUTRITION DIAGNOSIS: Food and nutrition related knowledge deficit related to new diagnosis of breast cancer as evidenced by no prior need for nutrition related information.  INTERVENTION:   Discussed and provided packet of information regarding nutritional tips for breast cancer patients.  Questions answered.  Teachback method used.  Contact information provided and patient knows to contact me with questions/concerns.    MONITORING, EVALUATION, and GOAL: Pt will consume a healthy plant based diet to maintain lean body mass throughout treatment.   Boleslaus Holloway B. Zenia Resides, [place], [place] Registered Dietitian [phone] (pager)

## 2017-04-28 NOTE — Progress Notes (Signed)
Radiation Oncology         ([phone] ________________________________  Initial outpatient Consultation  Name: Kristina Sanders MRN: [id number]  Date: 04/28/2017  DOB: 1961-05-18  CC:Kristina Douglas, PA-C  Jovita Kussmaul, MD   REFERRING PHYSICIAN: Autumn Messing III, MD  DIAGNOSIS:    ICD-10-CM   1. 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 female who presented with screening detected mass in the medial left breast with 2 areas of calcifications, which ultimately were found to be benign. The left breast mass was measured at 1.0 cm in the 10 o'clock position on ultrasound. Axilla was negative on ultrasound. Core biopsy showed grade 1-2 invasive ductal carcinoma with characteristics as described above in the diagnosis.   The patient presents to the clinic today to discuss the role that radiation therapy may play in the treatment of her disease. She is accompanied by her husband today.  The patient reports she skipped her mammogram in  2017, though she otherwise has had routine yearly mammograms. She has never used hormone replacement. 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  PREVIOUS RADIATION THERAPY: No  PAST MEDICAL HISTORY:  has a past medical history of Allergy; Anemia; History of abnormal cervical Pap smear; HSV infection; HTN (hypertension); Malignant neoplasm of upper-inner quadrant of left breast in female, estrogen receptor positive ([place]) (04/22/2017); Ovarian cyst; Perennial allergic rhinitis; and Seasonal allergic rhinitis.    PAST SURGICAL HISTORY: Past Surgical History:  Procedure Laterality Date  . CESAREAN SECTION    .  excision of precancerous mole    . TONSILLECTOMY AND ADENOIDECTOMY      FAMILY HISTORY: family history includes Breast cancer (age of onset: 63) in her paternal aunt; Mitral valve prolapse in her mother; Prostate cancer in her father; Transient ischemic attack in her father.  SOCIAL HISTORY:  reports that she has never smoked. She has never used smokeless tobacco. She reports that she drinks about 2.4 - 3.6 oz of alcohol per week . She reports that she does not use drugs. 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   ALLERGIES: Patient has no known allergies.  MEDICATIONS:  Current Outpatient Prescriptions  Medication Sig Dispense Refill  . amLODipine (NORVASC) 5 MG tablet Take 1 tablet (5 mg total) by mouth daily. 90 tablet 1  . diazepam (VALIUM) 10 MG tablet 1 tab po 1 hour before procedure (Patient not taking: Reported on 04/28/2017) 5 tablet 0  . EPIPEN 2-PAK 0.3 MG/0.3ML SOAJ injection   1  . fexofenadine (ALLEGRA) 180 MG tablet Take 180 mg by mouth daily.    . fluticasone (FLONASE) 50 MCG/ACT nasal spray Place 2 sprays into both nostrils daily. (Patient not taking: Reported on 04/28/2017) 16 g 6  . ibuprofen (ADVIL,MOTRIN) 200 MG tablet Take 600 mg by mouth every 6 (six) hours as needed for moderate pain.    Marland Kitchen ipratropium (ATROVENT) 0.03 % nasal spray Place 2 sprays into both nostrils 3 (three) times daily as needed for rhinitis (nasal drainage). 30 mL 0  . losartan (COZAAR) 100 MG tablet Take 1 tablet (100 mg total) by mouth daily. [place]  tablet 1  . nitrofurantoin, macrocrystal-monohydrate, (MACROBID) 100 MG capsule Take as directed 30 capsule 3  . NON FORMULARY Allergy injections once weekly    . valACYclovir (VALTREX) 500 MG tablet Take 500 mg by mouth as needed (cold sores).      No current facility-administered medications for this encounter.     REVIEW OF SYSTEMS: A 10+ POINT REVIEW OF SYSTEMS WAS OBTAINED including neurology, dermatology, psychiatry, cardiac,  respiratory, lymph, extremities, GI, GU, Musculoskeletal, constitutional, breasts, reproductive, HEENT.  All pertinent positives are noted in the HPI.  All others are negative.   PHYSICAL EXAM:   Vitals with BMI 04/28/2017  Height 5' 5.3"  Weight 176 lbs  BMI 62.1  Systolic 308  Diastolic 68  Pulse 74  Respirations 19  General: Alert and oriented, in no acute distress. HEENT: Head is normocephalic. Oropharynx is clear. Neck: Neck is supple, no palpable cervical or supraclavicular lymphadenopathy. Heart: Regular in rate and rhythm with no murmurs. Chest: Clear to auscultation bilaterally. Abdomen: Soft, non tender, non distended. Extremities: No edema in wrists or ankles. Lymphatics: see Neck Exam Skin: No concerning lesions. Neurologic: No obvious focalities. Speech is fluent. Coordination is intact. Psychiatric: Judgment and insight are intact. Affect is appropriate. Breasts: In the UIQ of the left breast there is a small nodule at the biopsy site, no more than a cm in size, which I presume is the tumor. No palpable axillary adenopathy on the left. No palpable masses in the right axilla or breast.   ECOG = 0  LABORATORY DATA:  Lab Results  Component Value Date   WBC 6.3 04/28/2017   HGB 12.8 04/28/2017   HCT 39.1 04/28/2017   MCV 96.3 04/28/2017   PLT 225 04/28/2017   CMP     Component Value Date/Time   NA 140 04/28/2017 0841   K 3.7 04/28/2017 0841   CL 99 12/09/2016 1708   CO2 27 04/28/2017 0841   GLUCOSE 109 04/28/2017 0841   BUN 14.7 04/28/2017 0841   CREATININE 0.7 04/28/2017 0841   CALCIUM 9.7 04/28/2017 0841   PROT 7.0 04/28/2017 0841   ALBUMIN 4.2 04/28/2017 0841   AST 21 04/28/2017 0841   ALT 21 04/28/2017 0841   ALKPHOS 68 04/28/2017 0841   BILITOT 0.58 04/28/2017 0841   GFRNONAA 113 12/09/2016 1708   GFRAA 130 12/09/2016 1708      RADIOGRAPHY: as above    IMPRESSION/PLAN: Left breast cancer.  She has been discussed at our multidisciplinary tumor  board.  The consensus is that she would be a good candidate for breast conservation. I talked to her about the option of a mastectomy and informed her that her expected overall survival would be equivalent between mastectomy and breast conservation, based upon randomized controlled data. She is enthusiastic about breast conservation.  Depending on final pathology, Oncotype testing may be considered to decide if systemic therapy is warranted.   It was a pleasure meeting the patient today. We discussed the risks, benefits, and side effects of radiotherapy. I recommend radiotherapy to the left breast to reduce her risk of locoregional recurrence by 2/3.  We discussed that radiation would take approximately 4 weeks to complete and that I would give the patient a few weeks to heal following surgery before starting treatment planning. If chemotherapy were to be given, this would precede radiotherapy. We spoke about acute effects including skin irritation and fatigue as well as much less common late effects including internal organ injury or irritation. We spoke  about the latest technology that is used to minimize the risk of late effects for patients undergoing radiotherapy to the breast or chest wall. No guarantees of treatment were given. The patient is enthusiastic about proceeding with treatment. I look forward to participating in the patient's care.  I will await her referral back to me for postoperative follow-up and eventual CT simulation/treatment planning.  Additionally, we discussed exercise guidelines, and I encouraged the patient to remain active. We also discussed data on alcohol use, and I advised the patient that an average of 3 or fewer drinks per week is recommended.    __________________________________________   Eppie Gibson, MD  This document serves as a record of services personally performed by Eppie Gibson, MD. It was created on her behalf by Maryla Morrow, a trained medical scribe.  The creation of this record is based on the scribe's personal observations and the provider's statements to them. This document has been checked and approved by the attending provider.

## 2017-04-28 NOTE — Therapy (Signed)
[place], [place], [address] Phone: [phone]   Fax:  [phone]  Physical Therapy Evaluation  Patient Details  Name: Kristina Sanders MRN: [id number] Date of Birth: 1961-08-15 Referring Provider: Dr. Autumn Messing  Encounter Date: 04/28/2017      PT End of Session - 04/28/17 1134    Visit Number 1   Number of Visits 1   PT Start Time 4562   PT Stop Time 0959  Also saw pt from [id number] for a total of 30 minutes   PT Time Calculation (min) 6 min   Activity Tolerance Patient tolerated treatment well   Behavior During Therapy [facility] for tasks assessed/performed      Past Medical History:  Diagnosis Date  . Allergy   . Anemia   . History of abnormal cervical Pap smear   . HSV infection    h/o  . HTN (hypertension)   . Malignant neoplasm of upper-inner quadrant of left breast in female, estrogen receptor positive ([place]) 04/22/2017  . Ovarian cyst    h/o  . Perennial allergic rhinitis    [place]   . Seasonal allergic rhinitis    [place]    Past Surgical History:  Procedure Laterality Date  . CESAREAN SECTION    . excision of precancerous mole    . TONSILLECTOMY AND ADENOIDECTOMY      There were no vitals filed for this visit.       Subjective Assessment - 04/28/17 1126    Subjective Patient reports she is here today to be assessed by her medical tema for her newly diagnosed left breast cancer.   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 It measures 1 cm and is located in the upper inner quadrant. It is ER/PR positive and HER2 negative. She has no other health problems.   Patient Stated Goals Reduce lymphedema risk and learn post op shoulder ROM HEP   Currently in Pain? No/denies            [facility] PT Assessment - 04/28/17 0001      Assessment   Medical Diagnosis Left  breast cancer   Referring Provider Dr. Autumn Messing   Onset Date/Surgical Date 04/07/17   Hand Dominance Right   Prior Therapy none     Precautions   Precautions Other (comment)     Restrictions   Weight Bearing Restrictions No     Balance Screen   Has the patient fallen in the past 6 months No   Has the patient had a decrease in activity level because of a fear of falling?  No   Is the patient reluctant to leave their home because of a fear of falling?  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 [employer] She does not exercise but reports getting 7000 steps per day on her FitBit     Cognition   Overall Cognitive Status Within Functional Limits for tasks assessed     Posture/Postural Control   Posture/Postural Control Postural limitations   Postural Limitations Rounded Shoulders;Forward head     ROM / Strength   AROM /  PROM / Strength AROM;Strength     AROM   AROM Assessment Site Shoulder;Cervical   Right/Left Shoulder Right;Left   Right Shoulder Extension 61 Degrees   Right Shoulder Flexion 151 Degrees   Right Shoulder ABduction 161 Degrees   Right Shoulder Internal Rotation 66 Degrees   Right Shoulder External Rotation 79 Degrees   Left Shoulder Extension 58 Degrees   Left Shoulder Flexion 156 Degrees   Left Shoulder ABduction 166 Degrees   Left Shoulder Internal Rotation 63 Degrees   Left Shoulder External Rotation 88 Degrees   Cervical Flexion WNL   Cervical Extension WNL   Cervical - Right Side Bend WNL   Cervical - Left Side Bend WNL   Cervical - Right Rotation WNL   Cervical - Left Rotation WNL     Strength   Overall Strength Within functional limits for tasks performed            LYMPHEDEMA/ONCOLOGY QUESTIONNAIRE - 04/28/17 1131      Type   Cancer Type Left breast cancer     Lymphedema Assessments   Lymphedema Assessments Upper extremities     Right Upper Extremity Lymphedema   10 cm Proximal to Olecranon Process 30.5 cm   Olecranon Process 25.4 cm   10 cm Proximal to Ulnar Styloid Process 22.9 cm   Just Proximal to Ulnar Styloid Process 15.3 cm   Across Hand at [employer] 18.8 cm   At [place] of 2nd Digit 6.1 cm     Left Upper Extremity Lymphedema   10 cm Proximal to Olecranon Process 30.6 cm   Olecranon Process 25.3 cm   10 cm Proximal to Ulnar Styloid Process 22.2 cm   Just Proximal to Ulnar Styloid Process 15 cm   Across Hand at [employer] 18.2 cm   At [place] of 2nd Digit 5.9 cm         Objective measurements completed on examination: See above findings.      Patient was instructed today in a home exercise program today for post op shoulder range of motion. These included active assist shoulder flexion in sitting, scapular retraction, wall walking with shoulder abduction, and hands behind head external rotation.  She was encouraged to do these twice a day, holding 3 seconds and repeating 5 times when permitted by her physician.         PT Education - 04/28/17 1133    Education provided Yes   Education Details Lymphedema risk reduction and post op shoulder ROM HEP   Person(s) Educated Patient   Methods Explanation;Demonstration;Handout   Comprehension Returned demonstration;Verbalized understanding              Breast Clinic Goals - 04/28/17 1137      Patient will be able to verbalize understanding of pertinent lymphedema risk reduction practices relevant to her diagnosis specifically related to skin care.   Time 1   Period Days   Status Achieved     Patient will be able to return demonstrate and/or verbalize understanding of the post-op home exercise program related to regaining shoulder range of motion.   Time 1    Period Days   Status Achieved     Patient will be able to verbalize understanding of the importance of attending the postoperative After Breast Cancer Class for further lymphedema risk reduction education and therapeutic exercise.   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 It measures 1 cm and is located in the upper inner quadrant. It is ER/PR positive and HER2 negative. She has no other health problems. Her multidisciplinary medical team met prior to her assessments to determine a recommended treatment plan. She is planning to have a left lumpcetomy and sentinel node biopsy followed by radiation and anti-estrogen therapy. She may benefit from post op PT to regain shoulder ROM and reduce lympehdema risk.   History and Personal Factors relevant to plan of care: None   Clinical Presentation Stable   Clinical Presentation due to: Condition is stable   Clinical Decision Making Low   Rehab Potential Excellent   Clinical Impairments Affecting Rehab Potential None   PT Frequency One time visit   PT Treatment/Interventions Therapeutic exercise;Patient/family education   PT Next Visit Plan Will f/u after surgery to determine PT needs   PT Home Exercise Plan Post op shoulder ROM HEP   Consulted and Agree with Plan of Care Patient;Family member/caregiver   Family Member Consulted Husband      Patient will benefit from skilled therapeutic intervention in order to improve the following deficits and impairments:  Postural dysfunction, Decreased knowledge of precautions, Pain, Impaired UE functional use, Decreased range of motion  Visit Diagnosis: Carcinoma of upper-inner quadrant of left breast in female, estrogen receptor positive ([place]) - Plan: PT plan of care cert/re-cert  Abnormal posture - Plan: PT plan of care cert/re-cert    Patient will follow up at outpatient cancer rehab if needed following surgery.  If the patient requires physical therapy at that time, a specific plan will be dictated and sent to the referring physician for approval. The patient was educated today on appropriate basic range of motion exercises to begin post operatively and the importance of attending the After Breast Cancer class following surgery.  Patient was educated today on lymphedema risk reduction practices as it pertains to recommendations that will benefit the patient immediately following surgery.  She verbalized good understanding.  No additional physical therapy is indicated at this time.      Problem List Patient Active Problem List   Diagnosis Date Noted  . Malignant neoplasm of upper-inner quadrant of left breast in female, estrogen receptor positive ([place]) 04/22/2017  . Allergic rhinitis due to other allergen 10/08/2012  . HTN (hypertension)    Annia Friendly, [place] 04/28/17 11:40 AM  [place], [place], [address] Phone: [phone]   Fax:  [phone]  Name: Kristina Sanders MRN: [id number] Date of Birth: 1961-03-15

## 2017-04-28 NOTE — Progress Notes (Signed)
Clinical Social Work [place] Psychosocial Distress Screening [place]  Patient completed distress screening protocol and scored a 3 on the Psychosocial Distress Thermometer which indicates mild distress. Clinical Social Worker met with patient and patients family in [facility] to assess for distress and other psychosocial needs. Patient stated she was feeling overwhelmed but felt "better" after meeting with the treatment team and getting more information on her treatment plan. CSW and patient discussed common feeling and emotions when being diagnosed with cancer, and the importance of support during treatment. CSW informed patient of the support team and support services at [facility], and patient was agreeable to an [employer] referral. CSW provided contact information and encouraged patient to call with any questions or concerns.  ONCBCN DISTRESS SCREENING 04/28/2017  Screening Type Initial Screening  Distress experienced in past week (1-10) 3  Practical problem type Work/school  Information Concerns Type Lack of info about treatment  Referral to support programs Yes     Johnnye Lana, MSW, LCSW, OSW-C Clinical Social Worker [place] [phone]

## 2017-05-04 ENCOUNTER — Encounter: Payer: Self-pay | Admitting: *Deleted

## 2017-05-04 ENCOUNTER — Telehealth: Payer: Self-pay | Admitting: *Deleted

## 2017-05-04 NOTE — Telephone Encounter (Signed)
  Oncology Nurse Navigator Documentation  Navigator Location: [facility] (05/04/17 1000)   )Navigator Encounter Type: Telephone (05/04/17 1000) Telephone: [place] (05/04/17 1000)     Surgery Date: 05/17/17 (05/04/17 1000)                                            Time Spent with Patient: 15 (05/04/17 1000)

## 2017-05-06 ENCOUNTER — Ambulatory Visit (INDEPENDENT_AMBULATORY_CARE_PROVIDER_SITE_OTHER): Payer: BC Managed Care – PPO | Admitting: Physician Assistant

## 2017-05-06 ENCOUNTER — Encounter: Payer: Self-pay | Admitting: Physician Assistant

## 2017-05-06 ENCOUNTER — Ambulatory Visit: Payer: BC Managed Care – PPO | Admitting: Physician Assistant

## 2017-05-06 VITALS — BP 121/77 | HR 100 | Temp 99.3°F | Resp 16 | Ht 66.0 in | Wt 170.4 lb

## 2017-05-06 DIAGNOSIS — J029 Acute pharyngitis, unspecified: Secondary | ICD-10-CM | POA: Diagnosis not present

## 2017-05-06 DIAGNOSIS — R059 Cough, unspecified: Secondary | ICD-10-CM

## 2017-05-06 DIAGNOSIS — R05 Cough: Secondary | ICD-10-CM

## 2017-05-06 DIAGNOSIS — J209 Acute bronchitis, unspecified: Secondary | ICD-10-CM | POA: Diagnosis not present

## 2017-05-06 LAB — POCT RAPID STREP A (OFFICE): Rapid Strep A Screen: NEGATIVE

## 2017-05-06 MED ORDER — FLUTICASONE PROPIONATE 50 MCG/ACT NA SUSP: 2.0000 | Freq: Every day | NASAL | 6 refills | Status: DC

## 2017-05-06 MED ORDER — HYDROCODONE-HOMATROPINE 5-1.5 MG/5ML PO SYRP: 5.0000 mL | ORAL_SOLUTION | Freq: Three times a day (TID) | ORAL | 0 refills | Status: DC | PRN

## 2017-05-06 MED ORDER — AZITHROMYCIN 250 MG PO TABS: ORAL_TABLET | ORAL | 0 refills | Status: DC

## 2017-05-06 NOTE — Patient Instructions (Addendum)
Warm tea with honey will help with your sore throat. Continue salt water gargles. Please push fluids and get plenty of rest.  Come back on Monday if you are not better.    Thank you for coming in today. I hope you feel we met your needs.  Feel free to call [facility] if you have any questions or further requests.  Please consider signing up for MyChart if you do not already have it, as this is a great way to communicate with me.  Best,  Whitney McVey, PA-C   IF you received an x-ray today, you will receive an invoice from [facility]. Please contact [facility] at [phone] with questions or concerns regarding your invoice.   IF you received labwork today, you will receive an invoice from [facility]. Please contact [facility] at [phone] with questions or concerns regarding your invoice.   Our billing staff will not be able to assist you with questions regarding bills from these companies.  You will be contacted with the lab results as soon as they are available. The fastest way to get your results is to activate your My Chart account. Instructions are located on the last page of this paperwork. If you have not heard from [place] regarding the results in 2 weeks, please contact this office.

## 2017-05-06 NOTE — Progress Notes (Signed)
Kristina Sanders  MRN: [id number] DOB: 13-Jul-1961  PCP: Leonie Douglas, PA-C  Subjective:  Pt is a pleasant 56 year old female who presents to clinic for sore throat and cough x 5 days. She had a fever of 102 and 101.5 about 4 days ago. C/o dry cough that "comes in spasms". Cough is keeping her up at night. She is eating and drinking fine.  She has tried advil and tylenol. Denies chest pain, shob, wheezing, night sweats.    Of note, she was recently diagnosed with breast cancer and has preadmission testing 6/27 and surgery scheduled for 7/2.  Review of Systems  Constitutional: Positive for chills and fever.  HENT: Positive for postnasal drip and sore throat. Negative for congestion, rhinorrhea, sinus pain and sinus pressure.   Respiratory: Positive for cough. Negative for chest tightness, shortness of breath and wheezing.   Cardiovascular: Negative for chest pain and palpitations.  Psychiatric/Behavioral: Positive for sleep disturbance.    Patient Active Problem List   Diagnosis Date Noted  . Malignant neoplasm of upper-inner quadrant of left breast in female, estrogen receptor positive ([place]) 04/22/2017  . Allergic rhinitis due to other allergen 10/08/2012  . HTN (hypertension)     Current Outpatient Prescriptions on File Prior to Visit  Medication Sig Dispense Refill  . amLODipine (NORVASC) 5 MG tablet Take 1 tablet (5 mg total) by mouth daily. 90 tablet 1  . EPIPEN 2-PAK 0.3 MG/0.3ML SOAJ injection   1  . fexofenadine (ALLEGRA) 180 MG tablet Take 180 mg by mouth daily.    Marland Kitchen ibuprofen (ADVIL,MOTRIN) 200 MG tablet Take 600 mg by mouth every 6 (six) hours as needed for moderate pain.    Mar[place] Kitchen ipratropium (ATROVENT) 0.03 % nasal spray Place 2 sprays into both nostrils 3 (three) times daily as needed for rhinitis (nasal drainage). 30 mL 0  . losartan (COZAAR) 100 MG tablet Take 1 tablet (100 mg total) by mouth daily. 90 tablet 1  . nitrofurantoin, macrocrystal-monohydrate,  (MACROBID) 100 MG capsule Take as directed 30 capsule 3  . NON FORMULARY Allergy injections once weekly    . valACYclovir (VALTREX) 500 MG tablet Take 500 mg by mouth as needed (cold sores).     . diazepam (VALIUM) 10 MG tablet 1 tab po 1 hour before procedure (Patient not taking: Reported on 04/28/2017) 5 tablet 0  . fluticasone (FLONASE) 50 MCG/ACT nasal spray Place 2 sprays into both nostrils daily. (Patient not taking: Reported on 05/06/2017) 16 g 6   No current facility-administered medications on file prior to visit.     No Known Allergies   Objective:  BP 121/77   Pulse 100   Temp 99.3 F (37.4 C) (Oral)   Resp 16   Ht 5\' 6"  (1.676 m)   Wt 170 lb 6.4 oz (77.3 kg)   SpO2 97%   BMI 27.50 kg/m   Physical Exam  Constitutional: She is oriented to person, place, and time and well-developed, well-nourished, and in no distress. No distress.  HENT:  Right Ear: Tympanic membrane normal.  Left Ear: Tympanic membrane normal.  Mouth/Throat: Mucous membranes are normal. No oropharyngeal exudate, posterior oropharyngeal edema or posterior oropharyngeal erythema.  tonsoliths   Cardiovascular: Normal rate, regular rhythm and normal heart sounds.   Pulmonary/Chest: Effort normal. She has no wheezes. She has rhonchi in the right lower field.  Neurological: She is alert and oriented to person, place, and time. GCS score is 15.  Skin: Skin is warm and dry.  Psychiatric: Mood, memory, affect and judgment normal.  Vitals reviewed.  Results for orders placed or performed in visit on 05/06/17  POCT rapid strep A  Result Value Ref Range   Rapid Strep A Screen Negative Negative    Assessment and Plan :  1. Acute bronchitis, unspecified organism - azithromycin (ZITHROMAX) 250 MG tablet; Take 2 tabs PO x 1 dose, then 1 tab PO QD x 4 days  Dispense: 6 tablet; Refill: 0 - Adventitious sounds on physical exam - will treat. Encouraged pt to push fluids, salt water gargles, warm teat with honey and  rest. RTC in 5 days if no improvement. She agrees with plan.  2. Cough - fluticasone (FLONASE) 50 MCG/ACT nasal spray; Place 2 sprays into both nostrils daily.  Dispense: 16 g; Refill: 6 - HYDROcodone-homatropine (HYCODAN) 5-1.5 MG/5ML syrup; Take 5 mLs by mouth every 8 (eight) hours as needed for cough.  Dispense: 120 mL; Refill: 0  3. Sore throat - POCT rapid strep A - Culture, Group A Strep - Negative reapid. Culture is pending.   Mercer Pod, PA-C  Primary Care at [place] 05/06/2017 4:59 PM

## 2017-05-09 LAB — CULTURE, GROUP A STREP: Strep A Culture: NEGATIVE

## 2017-05-11 NOTE — Pre-Procedure Instructions (Signed)
LIBIA FAZZINI  05/11/2017      [facility] [place], [address] AT [address] [place] [address] Phone: [phone] Fax: [phone]    Your procedure is scheduled on May 17, 2017.  Report to [facility] Admitting at 530 A.M.  Call this number if you have problems the morning of surgery:  [phone]   Remember:  Do not eat food or drink liquids after midnight.  Take these medicines the morning of surgery with A SIP OF WATER  Acetaminophen (tylenol), amlodipine (norvasc), diazepam (valium), famotidine (pepcid), fexofendadine (allegra), fluticasone (flonase), hydrocodone (hycodan)-if needed for cough, ipratropium (atrovent) nasal spray.   7 days prior to surgery STOP taking any Aspirin, Aleve, Naproxen, Ibuprofen, Motrin, Advil, Goody's, BC's, all herbal medications, fish oil, and all vitamins   Do not wear jewelry, make-up or nail polish.  Do not wear lotions, powders, or perfumes, or deoderant.  Do not shave 48 hours prior to surgery.  Men may shave face and neck.  Do not bring valuables to the hospital.  [facility] is not responsible for any belongings or valuables.  Contacts, dentures or bridgework may not be worn into surgery.  Leave your suitcase in the car.  After surgery it may be brought to your room.  For patients admitted to the hospital, discharge time will be determined by your treatment team.  Patients discharged the day of surgery will not be allowed to drive home.    Special instructions:   [place]- Preparing For Surgery  Before surgery, you can play an important role. Because skin is not sterile, your skin needs to be as free of germs as possible. You can reduce the number of germs on your skin by washing with CHG (chlorahexidine gluconate) Soap before surgery.  CHG is an antiseptic cleaner which kills germs and bonds with the skin to continue killing germs even  after washing.  Please do not use if you have an allergy to CHG or antibacterial soaps. If your skin becomes reddened/irritated stop using the CHG.  Do not shave (including legs and underarms) for at least 48 hours prior to first CHG shower. It is OK to shave your face.  Please follow these instructions carefully.   1. Shower the NIGHT BEFORE SURGERY and the MORNING OF SURGERY with CHG.   2. If you chose to wash your hair, wash your hair first as usual with your normal shampoo.  3. After you shampoo, rinse your hair and body thoroughly to remove the shampoo.  4. Use CHG as you would any other liquid soap. You can apply CHG directly to the skin and wash gently with a scrungie or a clean washcloth.   5. Apply the CHG Soap to your body ONLY FROM THE NECK DOWN.  Do not use on open wounds or open sores. Avoid contact with your eyes, ears, mouth and genitals (private parts). Wash genitals (private parts) with your normal soap.  6. Wash thoroughly, paying special attention to the area where your surgery will be performed.  7. Thoroughly rinse your body with warm water from the neck down.  8. DO NOT shower/wash with your normal soap after using and rinsing off the CHG Soap.  9. Pat yourself dry with a CLEAN TOWEL.   10. Wear CLEAN PAJAMAS   11. Place CLEAN SHEETS on your bed the night of your first shower and DO NOT  SLEEP WITH PETS.    Day of Surgery: Do not apply any deodorants/lotions. Please wear clean clothes to the hospital/surgery center.     Please read over the following fact sheets that you were given. Pain Booklet, Coughing and Deep Breathing and Surgical Site Infection Prevention

## 2017-05-12 ENCOUNTER — Encounter (HOSPITAL_COMMUNITY): Payer: Self-pay | Admitting: *Deleted

## 2017-05-12 ENCOUNTER — Encounter (HOSPITAL_COMMUNITY)
Admission: RE | Admit: 2017-05-12 | Discharge: 2017-05-12 | Disposition: A | Discharge status: Home / Self Care | Payer: BC Managed Care – PPO | Source: Ambulatory Visit | Attending: General Surgery | Admitting: General Surgery

## 2017-05-12 DIAGNOSIS — Z01810 Encounter for preprocedural cardiovascular examination: Secondary | ICD-10-CM | POA: Diagnosis present

## 2017-05-12 DIAGNOSIS — C50912 Malignant neoplasm of unspecified site of left female breast: Secondary | ICD-10-CM | POA: Insufficient documentation

## 2017-05-12 DIAGNOSIS — Z01812 Encounter for preprocedural laboratory examination: Secondary | ICD-10-CM | POA: Diagnosis not present

## 2017-05-12 HISTORY — DX: Gastro-esophageal reflux disease without esophagitis: K21.9

## 2017-05-12 LAB — BASIC METABOLIC PANEL
ANION GAP: 10 (ref 5–15)
BUN: 9 mg/dL (ref 6–20)
CO2: 25 mmol/L (ref 22–32)
Calcium: 9.7 mg/dL (ref 8.9–10.3)
Chloride: 101 mmol/L (ref 101–111)
Creatinine, Ser: 0.65 mg/dL (ref 0.44–1.00)
GFR calc Af Amer: >60 mL/min (ref >60)
GLUCOSE: 117 mg/dL — AB (ref 65–99)
POTASSIUM: 4.1 mmol/L (ref 3.5–5.1)
Sodium: 136 mmol/L (ref 135–145)

## 2017-05-12 LAB — CBC
HEMATOCRIT: 36.5 % (ref 36.0–46.0)
HEMOGLOBIN: 12.1 g/dL (ref 12.0–15.0)
MCH: 31.8 pg (ref 26.0–34.0)
MCHC: 33.2 g/dL (ref 30.0–36.0)
MCV: 95.8 fL (ref 78.0–100.0)
PLATELETS: 330 10*3/uL (ref 150–400)
RBC: 3.81 MIL/uL — AB (ref 3.87–5.11)
RDW: 11.7 % (ref 11.5–15.5)
WBC: 10.7 10*3/uL — AB (ref 4.0–10.5)

## 2017-05-12 NOTE — Pre-Procedure Instructions (Addendum)
Kristina Sanders  05/12/2017      [facility] [place], [address] [place] [place] [address] Phone: [phone] Fax: [phone]    Your procedure is scheduled on May 17, 2017.  Report to [facility] Admitting at 530 A.M.  Call this number if you have problems the morning of surgery:  [phone]   Remember:  Do not eat food or drink liquids after midnight.   Drink breeze boost drink at 330 am day of surgery(given to you at preop appt)   Take these medicines the morning of surgery with A SIP OF WATER  Acetaminophen (tylenol), amlodipine (norvasc), famotidine (pepcid) if needed, fexofendadine (allegra) if needed, fluticasone (flonase)if needed,  ipratropium (atrovent) nasal spray.   prior to surgery starting today 05/12/17  STOP taking any Aspirin, Aleve, Naproxen, Ibuprofen, Motrin, Advil, Goody's, BC's, all herbal medications, fish oil, and all vitamins/supplements   Do not wear jewelry, make-up or nail polish.  Do not wear lotions, powders, or perfumes, or deoderant.  Do not shave 48 hours prior to surgery.  Men may shave face and neck.  Do not bring valuables to the hospital.  [facility] is not responsible for any belongings or valuables.  Contacts, dentures or bridgework may not be worn into surgery.  Leave your suitcase in the car.  After surgery it may be brought to your room.  For patients admitted to the hospital, discharge time will be determined by your treatment team.  Patients discharged the day of surgery will not be allowed to drive home.    Special instructions:   [place]- Preparing For Surgery  Before surgery, you can play an important role. Because skin is not sterile, your skin needs to be as free of germs as possible. You can reduce the number of germs on your skin by washing with CHG (chlorahexidine gluconate) Soap before surgery.  CHG is an antiseptic  cleaner which kills germs and bonds with the skin to continue killing germs even after washing.  Please do not use if you have an allergy to CHG or antibacterial soaps. If your skin becomes reddened/irritated stop using the CHG.  Do not shave (including legs and underarms) for at least 48 hours prior to first CHG shower. It is OK to shave your face.  Please follow these instructions carefully.   1. Shower the NIGHT BEFORE SURGERY and the MORNING OF SURGERY with CHG.   2. If you chose to wash your hair, wash your hair first as usual with your normal shampoo.  3. After you shampoo, rinse your hair and body thoroughly to remove the shampoo.  4. Use CHG as you would any other liquid soap. You can apply CHG directly to the skin and wash gently with a scrungie or a clean washcloth.   5. Apply the CHG Soap to your body ONLY FROM THE NECK DOWN.  Do not use on open wounds or open sores. Avoid contact with your eyes, ears, mouth and genitals (private parts). Wash genitals (private parts) with your normal soap.  6. Wash thoroughly, paying special attention to the area where your surgery will be performed.  7. Thoroughly rinse your body with warm water from the neck down.  8. DO NOT shower/wash with your normal soap after using and rinsing off the CHG Soap.  9. Pat yourself dry with a CLEAN TOWEL.   10. Wear CLEAN  PAJAMAS   11. Place CLEAN SHEETS on your bed the night of your first shower and DO NOT SLEEP WITH PETS.    Day of Surgery: Do not apply any deodorants/lotions. Please wear clean clothes to the hospital/surgery center.     Please read over the  fact sheets that you were given.

## 2017-05-13 ENCOUNTER — Encounter: Payer: Self-pay | Admitting: Physician Assistant

## 2017-05-16 MED ORDER — GABAPENTIN 300 MG PO CAPS
300.0000 mg | ORAL_CAPSULE | ORAL | Status: AC
  Administered 2017-05-17: 300 mg via ORAL
  Filled 2017-05-16: qty 1

## 2017-05-16 MED ORDER — CELECOXIB 200 MG PO CAPS
400.0000 mg | ORAL_CAPSULE | ORAL | Status: AC
  Administered 2017-05-17: 400 mg via ORAL
  Filled 2017-05-16: qty 2

## 2017-05-16 NOTE — Anesthesia Preprocedure Evaluation (Addendum)
Anesthesia Evaluation  Patient identified by MRN, date of birth, ID band Patient awake    Reviewed: Allergy & Precautions, H&P , Patient's Chart, lab work & pertinent test results, reviewed documented beta blocker date and time   History of Anesthesia Complications Negative for: history of anesthetic complications  Airway Mallampati: II  TM Distance: >3 FB Neck ROM: full    Dental no notable dental hx. (+) Teeth Intact, Dental Advisory Given   Pulmonary    Pulmonary exam normal breath sounds clear to auscultation       Cardiovascular hypertension,  Rhythm:regular Rate:Normal     Neuro/Psych    GI/Hepatic   Endo/Other    Renal/GU      Musculoskeletal   Abdominal   Peds  Hematology   Anesthesia Other Findings   Reproductive/Obstetrics                           Anesthesia Physical Anesthesia Plan  ASA: II  Anesthesia Plan: General   Post-op Pain Management:    Induction: Intravenous  PONV Risk Score and Plan: 1 and Ondansetron, Dexamethasone and Propofol  Airway Management Planned: LMA  Additional Equipment:   Intra-op Plan:   Post-operative Plan:   Informed Consent: I have reviewed the patients History and Physical, chart, labs and discussed the procedure including the risks, benefits and alternatives for the proposed anesthesia with the patient or authorized representative who has indicated his/her understanding and acceptance.   Dental Advisory Given  Plan Discussed with: CRNA and Surgeon  Anesthesia Plan Comments: ( )       Anesthesia Quick Evaluation

## 2017-05-17 ENCOUNTER — Ambulatory Visit (HOSPITAL_COMMUNITY): Payer: BC Managed Care – PPO | Admitting: Anesthesiology

## 2017-05-17 ENCOUNTER — Encounter (HOSPITAL_COMMUNITY): Payer: Self-pay | Admitting: Certified Registered Nurse Anesthetist

## 2017-05-17 ENCOUNTER — Encounter (HOSPITAL_COMMUNITY)
Admission: RE | Disposition: A | Discharge status: Home / Self Care | Payer: Self-pay | Source: Ambulatory Visit | Attending: General Surgery

## 2017-05-17 ENCOUNTER — Ambulatory Visit (HOSPITAL_COMMUNITY)
Admission: RE | Admit: 2017-05-17 | Discharge: 2017-05-17 | Disposition: A | Discharge status: Home / Self Care | Payer: BC Managed Care – PPO | Source: Ambulatory Visit | Attending: General Surgery | Admitting: General Surgery

## 2017-05-17 DIAGNOSIS — I10 Essential (primary) hypertension: Secondary | ICD-10-CM | POA: Insufficient documentation

## 2017-05-17 DIAGNOSIS — N6012 Diffuse cystic mastopathy of left breast: Secondary | ICD-10-CM | POA: Insufficient documentation

## 2017-05-17 DIAGNOSIS — C50212 Malignant neoplasm of upper-inner quadrant of left female breast: Secondary | ICD-10-CM | POA: Diagnosis present

## 2017-05-17 DIAGNOSIS — Z823 Family history of stroke: Secondary | ICD-10-CM | POA: Insufficient documentation

## 2017-05-17 DIAGNOSIS — Z9889 Other specified postprocedural states: Secondary | ICD-10-CM | POA: Diagnosis not present

## 2017-05-17 DIAGNOSIS — Z79899 Other long term (current) drug therapy: Secondary | ICD-10-CM | POA: Insufficient documentation

## 2017-05-17 DIAGNOSIS — Z8042 Family history of malignant neoplasm of prostate: Secondary | ICD-10-CM | POA: Insufficient documentation

## 2017-05-17 DIAGNOSIS — Z170 Estrogen receptor positive status [ER+]: Secondary | ICD-10-CM | POA: Diagnosis not present

## 2017-05-17 DIAGNOSIS — Z7951 Long term (current) use of inhaled steroids: Secondary | ICD-10-CM | POA: Insufficient documentation

## 2017-05-17 DIAGNOSIS — D0512 Intraductal carcinoma in situ of left breast: Secondary | ICD-10-CM | POA: Diagnosis not present

## 2017-05-17 HISTORY — PX: BREAST LUMPECTOMY WITH RADIOACTIVE SEED AND SENTINEL LYMPH NODE BIOPSY: SHX6550

## 2017-05-17 SURGERY — BREAST LUMPECTOMY WITH RADIOACTIVE SEED AND SENTINEL LYMPH NODE BIOPSY
Anesthesia: General | Site: Breast | Laterality: Left

## 2017-05-17 MED ORDER — ONDANSETRON HCL 4 MG/2ML IJ SOLN
INTRAMUSCULAR | Status: DC | PRN
  Administered 2017-05-17: 4 mg via INTRAVENOUS

## 2017-05-17 MED ORDER — ACETAMINOPHEN 500 MG PO TABS
1000.0000 mg | ORAL_TABLET | ORAL | Status: AC
  Administered 2017-05-17: 1000 mg via ORAL
  Filled 2017-05-17: qty 2

## 2017-05-17 MED ORDER — LIDOCAINE HCL (CARDIAC) 20 MG/ML IV SOLN
INTRAVENOUS | Status: DC | PRN
  Administered 2017-05-17: 100 mg via INTRAVENOUS

## 2017-05-17 MED ORDER — FENTANYL CITRATE (PF) 250 MCG/5ML IJ SOLN
INTRAMUSCULAR | Status: AC
  Filled 2017-05-17: qty 5

## 2017-05-17 MED ORDER — PHENYLEPHRINE HCL 10 MG/ML IJ SOLN
INTRAMUSCULAR | Status: DC | PRN
  Administered 2017-05-17 (×2): 40 ug via INTRAVENOUS

## 2017-05-17 MED ORDER — PROPOFOL 10 MG/ML IV BOLUS
INTRAVENOUS | Status: AC
Start: 2017-05-17 — End: 2017-05-17
  Filled 2017-05-17: qty 40

## 2017-05-17 MED ORDER — CHLORHEXIDINE GLUCONATE CLOTH 2 % EX PADS: 6.0000 | MEDICATED_PAD | Freq: Once | CUTANEOUS | Status: DC

## 2017-05-17 MED ORDER — TECHNETIUM TC 99M SULFUR COLLOID FILTERED: 1.0000 | Freq: Once | INTRAVENOUS | Status: DC | PRN

## 2017-05-17 MED ORDER — FENTANYL CITRATE (PF) 100 MCG/2ML IJ SOLN
INTRAMUSCULAR | Status: DC | PRN
  Administered 2017-05-17 (×2): 25 ug via INTRAVENOUS
  Administered 2017-05-17: 50 ug via INTRAVENOUS
  Administered 2017-05-17: 25 ug via INTRAVENOUS

## 2017-05-17 MED ORDER — SODIUM CHLORIDE 0.9 % IJ SOLN
INTRAMUSCULAR | Status: AC
  Filled 2017-05-17: qty 10

## 2017-05-17 MED ORDER — METHYLENE BLUE 0.5 % INJ SOLN
INTRAVENOUS | Status: AC
  Filled 2017-05-17: qty 10

## 2017-05-17 MED ORDER — BUPIVACAINE-EPINEPHRINE (PF) 0.5% -1:200000 IJ SOLN
INTRAMUSCULAR | Status: DC | PRN
  Administered 2017-05-17: 25 mL via PERINEURAL

## 2017-05-17 MED ORDER — MIDAZOLAM HCL 5 MG/5ML IJ SOLN
INTRAMUSCULAR | Status: DC | PRN
  Administered 2017-05-17 (×2): 1 mg via INTRAVENOUS

## 2017-05-17 MED ORDER — 0.9 % SODIUM CHLORIDE (POUR BTL) OPTIME
TOPICAL | Status: DC | PRN
  Administered 2017-05-17: 1000 mL

## 2017-05-17 MED ORDER — MIDAZOLAM HCL 2 MG/2ML IJ SOLN
INTRAMUSCULAR | Status: AC
  Filled 2017-05-17: qty 2

## 2017-05-17 MED ORDER — BUPIVACAINE-EPINEPHRINE (PF) 0.25% -1:200000 IJ SOLN
INTRAMUSCULAR | Status: AC
  Filled 2017-05-17: qty 30

## 2017-05-17 MED ORDER — CELECOXIB 200 MG PO CAPS
ORAL_CAPSULE | ORAL | Status: AC
  Filled 2017-05-17: qty 1

## 2017-05-17 MED ORDER — HYDROCODONE-ACETAMINOPHEN 5-325 MG PO TABS: 1.0000 | ORAL_TABLET | ORAL | 0 refills | Status: DC | PRN

## 2017-05-17 MED ORDER — PROPOFOL 10 MG/ML IV BOLUS
INTRAVENOUS | Status: DC | PRN
  Administered 2017-05-17: 40 mg via INTRAVENOUS
  Administered 2017-05-17: 160 mg via INTRAVENOUS

## 2017-05-17 MED ORDER — BUPIVACAINE-EPINEPHRINE 0.25% -1:200000 IJ SOLN
INTRAMUSCULAR | Status: DC | PRN
  Administered 2017-05-17: 30 mL

## 2017-05-17 MED ORDER — FENTANYL CITRATE (PF) 100 MCG/2ML IJ SOLN: 25.0000 ug | INTRAMUSCULAR | Status: DC | PRN

## 2017-05-17 MED ORDER — DEXAMETHASONE SODIUM PHOSPHATE 10 MG/ML IJ SOLN
INTRAMUSCULAR | Status: DC | PRN
  Administered 2017-05-17: 10 mg via INTRAVENOUS

## 2017-05-17 MED ORDER — CEFAZOLIN SODIUM-DEXTROSE 2-4 GM/100ML-% IV SOLN
2.0000 g | INTRAVENOUS | Status: AC
  Administered 2017-05-17: 2 g via INTRAVENOUS
  Filled 2017-05-17: qty 100

## 2017-05-17 MED ORDER — LACTATED RINGERS IV SOLN
INTRAVENOUS | Status: DC | PRN
  Administered 2017-05-17 (×2): via INTRAVENOUS

## 2017-05-17 SURGICAL SUPPLY — 43 items
ADH SKN CLS APL DERMABOND .7 (GAUZE/BANDAGES/DRESSINGS) ×1
APPLIER CLIP 9.375 MED OPEN (MISCELLANEOUS) ×2
APR CLP MED 9.3 20 MLT OPN (MISCELLANEOUS) ×1
BLADE SURG 15 STRL LF DISP TIS (BLADE) ×1 IMPLANT
BLADE SURG 15 STRL SS (BLADE) ×2
CANISTER SUCT 3000ML PPV (MISCELLANEOUS) ×2 IMPLANT
CHLORAPREP W/TINT 26ML (MISCELLANEOUS) ×2 IMPLANT
CLIP APPLIE 9.375 MED OPEN (MISCELLANEOUS) ×1 IMPLANT
CONT SPEC 4OZ CLIKSEAL STRL BL (MISCELLANEOUS) ×6 IMPLANT
COVER PROBE W GEL 5X96 (DRAPES) ×2 IMPLANT
COVER SURGICAL LIGHT HANDLE (MISCELLANEOUS) ×2 IMPLANT
DERMABOND ADVANCED (GAUZE/BANDAGES/DRESSINGS) ×1
DERMABOND ADVANCED .7 DNX12 (GAUZE/BANDAGES/DRESSINGS) ×1 IMPLANT
DRAPE CHEST BREAST 15X10 FENES (DRAPES) ×2 IMPLANT
DRAPE UTILITY XL STRL (DRAPES) ×2 IMPLANT
ELECT COATED BLADE 2.86 ST (ELECTRODE) ×2 IMPLANT
ELECT REM PT RETURN 9FT ADLT (ELECTROSURGICAL) ×2
ELECTRODE REM PT RTRN 9FT ADLT (ELECTROSURGICAL) ×1 IMPLANT
GLOVE BIO SURGEON STRL SZ7.5 (GLOVE) ×2 IMPLANT
GOWN STRL REUS W/ TWL LRG LVL3 (GOWN DISPOSABLE) ×2 IMPLANT
GOWN STRL REUS W/TWL LRG LVL3 (GOWN DISPOSABLE) ×4
KIT BASIN OR (CUSTOM PROCEDURE TRAY) ×2 IMPLANT
KIT MARKER MARGIN INK (KITS) ×2 IMPLANT
LIGHT WAVEGUIDE WIDE FLAT (MISCELLANEOUS) ×1 IMPLANT
NDL FILTER BLUNT 18X1 1/2 (NEEDLE) IMPLANT
NDL HYPO 25X1 1.5 SAFETY (NEEDLE) ×1 IMPLANT
NDL SAFETY ECLIPSE 18X1.5 (NEEDLE) IMPLANT
NEEDLE FILTER BLUNT 18X 1/2SAF (NEEDLE)
NEEDLE FILTER BLUNT 18X1 1/2 (NEEDLE) IMPLANT
NEEDLE HYPO 18GX1.5 SHARP (NEEDLE)
NEEDLE HYPO 25X1 1.5 SAFETY (NEEDLE) ×2 IMPLANT
NS IRRIG 1000ML POUR BTL (IV SOLUTION) ×2 IMPLANT
PACK SURGICAL SETUP 50X90 (CUSTOM PROCEDURE TRAY) ×2 IMPLANT
PENCIL BUTTON HOLSTER BLD 10FT (ELECTRODE) ×2 IMPLANT
SPONGE LAP 18X18 X RAY DECT (DISPOSABLE) ×2 IMPLANT
SUT MNCRL AB 4-0 PS2 18 (SUTURE) ×4 IMPLANT
SUT VIC AB 3-0 SH 18 (SUTURE) ×2 IMPLANT
SYR BULB 3OZ (MISCELLANEOUS) ×2 IMPLANT
SYR CONTROL 10ML LL (SYRINGE) ×2 IMPLANT
TOWEL OR 17X24 6PK STRL BLUE (TOWEL DISPOSABLE) ×2 IMPLANT
TOWEL OR 17X26 10 PK STRL BLUE (TOWEL DISPOSABLE) ×1 IMPLANT
TUBE CONNECTING 12X1/4 (SUCTIONS) ×2 IMPLANT
YANKAUER SUCT BULB TIP NO VENT (SUCTIONS) ×2 IMPLANT

## 2017-05-17 NOTE — Anesthesia Procedure Notes (Signed)
Procedure Name: LMA Insertion Date/Time: 05/17/2017 7:38 AM Performed by: Tressia Miners LEFFEW Pre-anesthesia Checklist: Patient identified, Emergency Drugs available, Suction available, Timeout performed and Patient being monitored Patient Re-evaluated:Patient Re-evaluated prior to inductionOxygen Delivery Method: Circle system utilized Preoxygenation: Pre-oxygenation with 100% oxygen Intubation Type: IV induction Ventilation: Mask ventilation without difficulty LMA: LMA inserted LMA Size: 4.0 Number of attempts: 1 Tube secured with: Tape Dental Injury: Teeth and Oropharynx as per pre-operative assessment

## 2017-05-17 NOTE — H&P (Signed)
Kristina Sanders  Location: [facility] Surgery Patient #: [id number] DOB: [birth year] Unknown / Language: Cleophus Molt / Race: White Female   History of Present Illness  The patient is a 56 year old female who presents with breast cancer. We are asked to see the patient in consultation by Dr. Isidore Moos to evaluate her for a new left breast cancer. The patient is a 56 year old white female who presents with a screening mammogram that showed a mass in the medial left breast. There were other calcs that looked benign. The axilla looked negative. 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 She does not smoke and has no family history of breast cancer.   Past Surgical History  Breast Biopsy  Left. Cesarean Section - Multiple   Diagnostic Studies History  Colonoscopy  1-5 years ago Mammogram  within last year Pap Smear  1-5 years ago  Medication History  Medications Reconciled  Social History  Alcohol use  Occasional alcohol use. Caffeine use  Tea. No drug use  Tobacco use  Never smoker.  Family History  Cerebrovascular Accident  Family Members In General, Father. Prostate Cancer  Father.  Pregnancy / Birth History Age at menarche  51 years. Age of menopause  51-55 Contraceptive History  Intrauterine device, Oral contraceptives. Gravida  3 Length (months) of breastfeeding  7-12 Maternal age  49-30 Para  3  Other Problems  High blood pressure     Review of Systems General Not Present- Appetite Loss, Chills, Fatigue, Fever, Night Sweats, Weight Gain and Weight Loss. Skin Not Present- Change in Wart/Mole, Dryness, Hives, Jaundice, New Lesions, Non-Healing Wounds, Rash and Ulcer. HEENT Not Present- Earache, Hearing Loss, Hoarseness, Nose Bleed, Oral Ulcers, Ringing in the Ears, Seasonal Allergies, Sinus Pain, Sore Throat, Visual Disturbances, Wears glasses/contact lenses and Yellow Eyes. Respiratory  Present- Snoring. Not Present- Bloody sputum, Chronic Cough, Difficulty Breathing and Wheezing. Breast Present- Breast Mass. Not Present- Breast Pain, Nipple Discharge and Skin Changes. Cardiovascular Not Present- Chest Pain, Difficulty Breathing Lying Down, Leg Cramps, Palpitations, Rapid Heart Rate, Shortness of Breath and Swelling of Extremities. Gastrointestinal Not Present- Abdominal Pain, Bloating, Bloody Stool, Change in Bowel Habits, Chronic diarrhea, Constipation, Difficulty Swallowing, Excessive gas, Gets full quickly at meals, Hemorrhoids, Indigestion, Nausea, Rectal Pain and Vomiting. Female Genitourinary Not Present- Frequency, Nocturia, Painful Urination, Pelvic Pain and Urgency. Musculoskeletal Not Present- Back Pain, Joint Pain, Joint Stiffness, Muscle Pain, Muscle Weakness and Swelling of Extremities. Neurological Not Present- Decreased Memory, Fainting, Headaches, Numbness, Seizures, Tingling, Tremor, Trouble walking and Weakness. Psychiatric Not Present- Anxiety, Bipolar, Change in Sleep Pattern, Depression, Fearful and Frequent crying. Endocrine Present- Hot flashes. Not Present- Cold Intolerance, Excessive Hunger, Hair Changes, Heat Intolerance and New Diabetes. Hematology Not Present- Blood Thinners, Easy Bruising, Excessive bleeding, Gland problems, HIV and Persistent Infections.   Physical Exam  General Mental Status-Alert. General Appearance-Consistent with stated age. Hydration-Well hydrated. Voice-Normal.  Head and Neck Head-normocephalic, atraumatic with no lesions or palpable masses. Trachea-midline. Thyroid Gland Characteristics - normal size and consistency.  Eye Eyeball - Bilateral-Extraocular movements intact. Sclera/Conjunctiva - Bilateral-No scleral icterus.  Chest and Lung Exam Chest and lung exam reveals -quiet, even and easy respiratory effort with no use of accessory muscles and on auscultation, normal breath sounds, no  adventitious sounds and normal vocal resonance. Inspection Chest Wall - Normal. Back - normal.  Breast Note: There is no palpable mass in  either breast. There is no palpable axillary, supraclavicular, or cervical lymphadenopathy   Cardiovascular Cardiovascular examination reveals -normal heart sounds, regular rate and rhythm with no murmurs and normal pedal pulses bilaterally.  Abdomen Inspection Inspection of the abdomen reveals - No Hernias. Skin - Scar - no surgical scars. Palpation/Percussion Palpation and Percussion of the abdomen reveal - Soft, Non Tender, No Rebound tenderness, No Rigidity (guarding) and No hepatosplenomegaly. Auscultation Auscultation of the abdomen reveals - Bowel sounds normal.  Neurologic Neurologic evaluation reveals -alert and oriented x 3 with no impairment of recent or remote memory. Mental Status-Normal.  Musculoskeletal Normal Exam - Left-Upper Extremity Strength Normal and Lower Extremity Strength Normal. Normal Exam - Right-Upper Extremity Strength Normal and Lower Extremity Strength Normal.  Lymphatic Head & Neck  General Head & Neck Lymphatics: Bilateral - Description - Normal. Axillary  General Axillary Region: Bilateral - Description - Normal. Tenderness - Non Tender. Femoral & Inguinal  Generalized Femoral & Inguinal Lymphatics: Bilateral - Description - Normal. Tenderness - Non Tender.    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 I have talked to her about the different options for treatment and at this point she favors breast conservation. I think this is a reasonable way of treating her cancer. She is also a good candidate for sentinel node mapping. She will need a seed localization since the cancer is not palpable. I have discussed with her in detail the  risks and benefits of the surgery as well as some of the technical aspects and she understands and wishes to proceed. Current Plans Pt Education - Breast cancer: discussed with patient and provided information.

## 2017-05-17 NOTE — Op Note (Signed)
05/17/2017  8:55 AM  PATIENT:  Kristina Sanders  56 y.o. female  PRE-OPERATIVE DIAGNOSIS:  LEFT BREAST CANCER  POST-OPERATIVE DIAGNOSIS:  LEFT BREAST CANCER  PROCEDURE:  Procedure(s): LEFT BREAST LUMPECTOMY WITH RADIOACTIVE SEED AND DEEP RIGHT AXILLARY SENTINEL LYMPH NODE BIOPSY (Left)  SURGEON:  Surgeon(s) and Role:    * Jovita Kussmaul, MD - Primary  PHYSICIAN ASSISTANT:   ASSISTANTS: none   ANESTHESIA:   local and general  EBL:  Total I/O In: 1000 [I.V.:1000] Out: 10 [Blood:10]  BLOOD ADMINISTERED:none  DRAINS: none   LOCAL MEDICATIONS USED:  MARCAINE     SPECIMEN:  Source of Specimen:  left breast tissue with additional superior, medial, lateral, and anterior margins with sentinel node  DISPOSITION OF SPECIMEN:  PATHOLOGY  COUNTS:  YES  TOURNIQUET:  * No tourniquets in log *  DICTATION: .Dragon Dictation   After informed consent was obtained the patient was brought to the operating room and placed in the supine position on the operating room table. After adequate induction of general anesthesia the patient's left chest, breast, and axillary area were prepped with ChloraPrep, allowed to dry, and draped in usual sterile manner. An appropriate timeout was performed. Earlier in the day the patient underwent injection of 1 mCi of technetium sulfur colloid in the subareolar position on the left. Recently an I-125 seed was placed in the upper inner left breast to mark an area of invasive breast cancer. The neoprobe was set to technetium and an area of radioactivity was identified in the left axilla. A small transversely oriented incision was made with a 15 blade knife overlying this area of radioactivity. The incision was carried through the skin and subcutaneous tissue sharply with the electrocautery until the deep left axillary space was opened. The neoprobe was used to direct blunt hemostat dissection until a hot lymph node was identified. The lymph node was excised sharply  with the electrocautery and the lymphatics were controlled with clips. Ex vivo counts on the sentinel node were approximately 300. No other hot or palpable lymph nodes were identified in the left axilla. The area was infiltrated with quarter percent Marcaine. The deep layer of the wound was closed with interrupted 3-0 Vicryl stitches. The skin was then closed with a running 4-0 Monocryl subcuticular stitch. Attention was then turned to the left breast. The neoprobe was set to a I-125. The area of radioactivity was readily identified in the upper inner quadrant. The area around this was infiltrated with quarter percent Marcaine. A curvilinear incision was made along the upper inner edge of the areola of the left breast. The incision was carried through the skin and subcutaneous tissue sharply with electrocautery. Dissection was then carried towards the radioactive seed sharply with the electrocautery in the direction of the neoprobe. Once I approached the radioactive seed then a circular portion of breast tissue was excised sharply around the radioactive seed. Once this tissue was removed it was oriented with the appropriate paint colors. A specimen radiograph showed the clip in seed to be near the superior edge of the specimen. The specimen was then sent to pathology for further evaluation. Additional superior, medial, lateral, and anterior margins were taken and marked appropriately. The deep layer of the dissection was the muscle the chest wall. Next the cavity was marked with clips. The cavity was irrigated with saline and infiltrated with quarter percent Marcaine. The deep layer of the wound was then closed with layers of interrupted 3-0 Vicryl stitches. The skin  was then closed with interrupted 4-0 Monocryl subcuticular stitches. Dermabond dressings were applied. The patient tolerated the procedure well. At the end of the case all needle sponge and instrument counts were correct. The patient was then awakened  and taken to recovery in stable condition.  PLAN OF CARE: Discharge to home after PACU  PATIENT DISPOSITION:  PACU - hemodynamically stable.   Delay start of Pharmacological VTE agent (>24hrs) due to surgical blood loss or risk of bleeding: not applicable

## 2017-05-17 NOTE — Anesthesia Procedure Notes (Signed)
Anesthesia Regional Block: Pectoralis block   Pre-Anesthetic Checklist: ,, timeout performed, Correct Patient, Correct Site, Correct Laterality, Correct Procedure, Correct Position, site marked, Risks and benefits discussed, pre-op evaluation,  At surgeon's request and post-op pain management  Laterality: Left  Prep: chloraprep       Needles:   Needle Type: Echogenic Needle     Needle Length: 9cm  Needle Gauge: 21     Additional Needles:   Procedures: ultrasound guided,,,,,,,,  Narrative:  Start time: 05/17/2017 7:10 AM End time: 05/17/2017 7:17 AM Injection made incrementally with aspirations every 5 mL. Anesthesiologist: Lyndle Herrlich

## 2017-05-17 NOTE — Transfer of Care (Signed)
Immediate Anesthesia Transfer of Care Note  Patient: Kristina Sanders  Procedure(s) Performed: Procedure(s): LEFT BREAST LUMPECTOMY WITH RADIOACTIVE SEED AND SENTINEL LYMPH NODE BIOPSY (Left)  Patient Location: PACU  Anesthesia Type:General  Level of Consciousness: awake, alert , oriented, patient cooperative and responds to stimulation  Airway & Oxygen Therapy: Patient Spontanous Breathing and Patient connected to nasal cannula oxygen  Post-op Assessment: Report given to RN, Post -op Vital signs reviewed and stable and Patient moving all extremities X 4  Post vital signs: Reviewed and stable  Last Vitals:  Vitals:   05/17/17 0722 05/17/17 0910  BP:  (!) 150/76  Pulse: 88 96  Resp: 18 12  Temp:      Last Pain:  Vitals:   05/17/17 0600  TempSrc: Oral         Complications: No apparent anesthesia complications

## 2017-05-17 NOTE — Interval H&P Note (Signed)
History and Physical Interval Note:  05/17/2017 7:07 AM  Kristina Sanders  has presented today for surgery, with the diagnosis of LEFT BREAST CANCER  The various methods of treatment have been discussed with the patient and family. After consideration of risks, benefits and other options for treatment, the patient has consented to  Procedure(s): LEFT BREAST LUMPECTOMY WITH RADIOACTIVE SEED AND SENTINEL LYMPH NODE BIOPSY (Left) as a surgical intervention .  The patient's history has been reviewed, patient examined, no change in status, stable for surgery.  I have reviewed the patient's chart and labs.  Questions were answered to the patient's satisfaction.     TOTH III,PAUL S

## 2017-05-18 ENCOUNTER — Encounter (HOSPITAL_COMMUNITY): Payer: Self-pay | Admitting: General Surgery

## 2017-05-21 NOTE — Anesthesia Postprocedure Evaluation (Signed)
Anesthesia Post Note  Patient: Kristina Sanders  Procedure(s) Performed: Procedure(s) (LRB): LEFT BREAST LUMPECTOMY WITH RADIOACTIVE SEED AND SENTINEL LYMPH NODE BIOPSY (Left)     Patient location during evaluation: PACU Anesthesia Type: General Level of consciousness: awake and alert Pain management: pain level controlled Vital Signs Assessment: post-procedure vital signs reviewed and stable Respiratory status: spontaneous breathing, nonlabored ventilation, respiratory function stable and patient connected to nasal cannula oxygen Cardiovascular status: blood pressure returned to baseline and stable Postop Assessment: no signs of nausea or vomiting Anesthetic complications: no    Last Vitals:  Vitals:   05/17/17 0930 05/17/17 0945  BP:  (!) 145/73  Pulse: 81 77  Resp: 18 16  Temp: 36.1 C     Last Pain:  Vitals:   05/17/17 0925  TempSrc:   PainSc: 3                  [name] EDWARD

## 2017-05-25 ENCOUNTER — Telehealth: Payer: Self-pay | Admitting: *Deleted

## 2017-05-25 NOTE — Telephone Encounter (Signed)
Ordered oncotype per Dr. Feng.  Faxed requisition to pathology and confirmed receipt.  Faxed PA to [employer]. 

## 2017-06-01 ENCOUNTER — Encounter: Payer: Self-pay | Admitting: Radiation Oncology

## 2017-06-01 ENCOUNTER — Encounter (HOSPITAL_COMMUNITY): Payer: Self-pay

## 2017-06-01 ENCOUNTER — Telehealth: Payer: Self-pay | Admitting: *Deleted

## 2017-06-01 DIAGNOSIS — C50212 Malignant neoplasm of upper-inner quadrant of left female breast: Secondary | ICD-10-CM

## 2017-06-01 DIAGNOSIS — Z170 Estrogen receptor positive status [ER+]: Secondary | ICD-10-CM

## 2017-06-01 NOTE — Telephone Encounter (Signed)
Received oncotype score of 0/3%. Physician team notified. Called pt with results. Discussed next step is appt with Dr. Isidore Moos to discuss xrt. Received verbal undstanding. Denies needs or questions at this time

## 2017-06-02 ENCOUNTER — Other Ambulatory Visit: Payer: Self-pay | Admitting: Physician Assistant

## 2017-06-02 DIAGNOSIS — I10 Essential (primary) hypertension: Secondary | ICD-10-CM

## 2017-06-04 ENCOUNTER — Encounter: Payer: Self-pay | Admitting: Physician Assistant

## 2017-06-04 NOTE — Telephone Encounter (Signed)
PT is calling to check the status of refill req. Pt will be on travel and is asking that rx is submitted for 30 travel supply for pick up tomorrow am// pt was advised that office usually needs more time to fufill this req. Pt's last vist for HTN was 12/09/16//

## 2017-06-08 ENCOUNTER — Encounter: Payer: Self-pay | Admitting: Obstetrics & Gynecology

## 2017-06-08 NOTE — Progress Notes (Signed)
Location of Breast Cancer: Left Breast  Histology per Pathology Report:  04/19/17 Diagnosis Breast, left, needle core biopsy INVASIVE DUCTAL CARCINOMA, GRADE 1 DUCTAL CARCINOMA IN SITU, GRADE 1  Receptor Status: ER(100%), PR (100%), Her2-neu (NEG), Ki-(30%)  05/17/17 Diagnosis 1. 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 Lymph node, sentinel, biopsy, Left axillary ONE LYMPH NODE, NEGATIVE FOR CARCINOMA (0/1) 3. Lymph node, sentinel, biopsy, Left axillary ONE LYMPH NODE, NEGATIVE FOR CARCINOMA (0/1) 4. Breast, excision, Left new anterior margin SMALL FOCUS OF DUCTAL CARCINOMA IN SITU THE CARCINOMA IS 1 MM FROM THE ANTERIOR INKED MARGIN 5. Breast, excision, Left new medial margin BENIGN BREAST TISSUE NEGATIVE FOR CARCINOMA 6. Breast, excision, Left new superior margin FIBROCYSTIC CHANGES WITH CALCIFICATIONS NEGATIVE FOR CARCINOMA 7. Breast, excision, Left new lateral margin FIBROCYSTIC CHANGES WITH CALCIFICATIONS USUAL DUCTAL HYPERPLASIA NEGATIVE FOR CARCINOMA  Did patient present with symptoms or was this found on screening mammography?: It was found on a screening mammogram.   Past/Anticipated interventions by surgeon, if any: 05/17/17 PROCEDURE:  Procedure(s): LEFT BREAST LUMPECTOMY WITH RADIOACTIVE SEED AND DEEP RIGHT AXILLARY SENTINEL LYMPH NODE BIOPSY (Left)  SURGEON:  Surgeon(s) and Role:    Jovita Kussmaul, MD - Primary  She will see Dr. Marlou Starks tomorrow (06/15/17)  Past/Anticipated interventions by medical oncology, if any:  04/28/17 [facility], Dr. Burr Medico Plan -She will likely have lumpectomy  and a sentinel lymph node biopsy soon (completed 05/17/17) -We'll send Oncotype on surgical sample if tumor was a 1 cm, or was in 5 mm with grade 2 or 3 , or  mammaprint if node positive (Oncotype score 0/3%, will procede to radiation) -I plan to see her back after she completes radiation, will soon if her Oncotype shows high-risk disease.  -I'll check her [facility] and asked to level on her next visit.  Lymphedema issues, if any: She denies. She has good arm mobility.    Pain issues, if any:  She has soreness to her Left Breast. She rates it a 2/10.   SAFETY ISSUES:  Prior radiation? No  Pacemaker/ICD? No  Possible current pregnancy? No  Is the patient on methotrexate? No  Current Complaints / other details:    BP 130/71   Pulse 72   Temp 98.4 F (36.9 C)   Ht 5' 6"  (1.676 m)   Wt 174 lb 12.8 oz (79.3 kg)   LMP 07/17/2013 Comment: had slight spotting post insertion, IUD removed in 03/2017, blood  work confirmed- menopause  SpO2 100% Comment: room air  BMI 28.21 kg/m    Wt Readings from Last 3 Encounters:  06/14/17 174 lb 12.8 oz (79.3 kg)  05/12/17 168 lb 11.2 oz (76.5 kg)  05/06/17 170 lb 6.4 oz (77.3 kg)      Kert Shackett, Stephani Police, RN 06/08/2017,4:27 PM

## 2017-06-11 ENCOUNTER — Encounter: Payer: Self-pay | Admitting: *Deleted

## 2017-06-14 ENCOUNTER — Ambulatory Visit
Admission: RE | Admit: 2017-06-14 | Discharge: 2017-06-14 | Disposition: A | Discharge status: Home / Self Care | Payer: BC Managed Care – PPO | Source: Ambulatory Visit | Attending: Radiation Oncology | Admitting: Radiation Oncology

## 2017-06-14 ENCOUNTER — Encounter: Payer: Self-pay | Admitting: Radiation Oncology

## 2017-06-14 DIAGNOSIS — C50212 Malignant neoplasm of upper-inner quadrant of left female breast: Secondary | ICD-10-CM

## 2017-06-14 DIAGNOSIS — Z170 Estrogen receptor positive status [ER+]: Secondary | ICD-10-CM

## 2017-06-14 DIAGNOSIS — Z510 Encounter for antineoplastic radiation therapy: Secondary | ICD-10-CM | POA: Diagnosis present

## 2017-06-15 NOTE — Progress Notes (Signed)
Radiation Oncology         [phone]) [phone] ________________________________  Initial outpatient Consultation  Name: Kristina Sanders MRN: [id number]  Date: 06/14/2017  DOB: [birth year]  CC:Kristina Mings, MD   REFERRING PHYSICIAN: Truitt Merle, MD  DIAGNOSIS:    ICD-10-CM   1. 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 female who was initially seen in consultation at multidisciplinary breast clinic on 04/28/2017. Since that time, the pt has undergone, a left breast lumpectomy on 05/17/17 completed by Dr. 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 All margins of resection are negative for carcinoma. Carcinoma in situ is focally 1 mm from the anterior inked margin. Of 2 lymph nodes samples from the left axillary, all were negative for carcinoma.   Pt underwent oncotype testing with a result of 0 and patient case was discussed with  theTumor Board on last week and it was noted there was no need for chemotherapy and to have the pt proceed with radiation therapy.   The patient presents to the clinic today to discuss the role that radiation therapy may play in the treatment of her disease. Pt reports that she recently returned from [place] with her [employer] and plans to start working again as a [employer] in August 2018. Pt anticipates that she will prefer treatment times in the afternoon.   She is post-menopausal. Denies menstruation, denies chance of pregnancy.   Good ROM in arms, no significant swelling in breast, no significant  pain.  PREVIOUS RADIATION THERAPY: No  PAST MEDICAL HISTORY:  has a past medical history of Allergy; Anemia; GERD (gastroesophageal reflux disease); History of abnormal cervical Pap smear; HSV infection; HTN (hypertension); Malignant neoplasm of upper-inner quadrant of left breast in female, estrogen receptor positive ([place]) (04/22/2017); Ovarian cyst; Perennial allergic rhinitis; and Seasonal allergic rhinitis.    PAST SURGICAL HISTORY: Past Surgical History:  Procedure Laterality Date  . ADENOIDECTOMY     only adenoids still have tonsils  . BREAST LUMPECTOMY WITH RADIOACTIVE SEED AND SENTINEL LYMPH NODE BIOPSY Left 05/17/2017   Procedure: LEFT BREAST LUMPECTOMY WITH RADIOACTIVE SEED AND SENTINEL LYMPH NODE BIOPSY;  Surgeon: Jovita Kussmaul, MD;  Location: [facility];  Service: General;  Laterality: Left;  . CESAREAN SECTION     x3  . excision of precancerous mole     lft arm, chest    FAMILY HISTORY: family history includes Breast cancer (age of onset: 44) in her paternal aunt; Mitral valve prolapse in her mother; Prostate cancer in her father; Transient ischemic attack in her father.  SOCIAL HISTORY:  reports that she has never smoked. She has never used smokeless tobacco. She reports that she drinks about 2.4 - 3.6 oz of alcohol per week . She reports that she does not use drugs.  ALLERGIES: No known allergies  MEDICATIONS:  Current Outpatient Prescriptions  Medication Sig Dispense Refill  . acetaminophen (TYLENOL) 500 MG tablet Take 1,000 mg by mouth every 6 (six) hours as needed for mild pain.    Marland Kitchen  amLODipine (NORVASC) 5 MG tablet TAKE 1 TABLET(5 MG) BY MOUTH DAILY 30 tablet 0  . famotidine (PEPCID) 20 MG tablet Take 20 mg by mouth daily as needed for heartburn or indigestion.    . fexofenadine (ALLEGRA) 180 MG tablet Take 180 mg by mouth daily.    . fluticasone (FLONASE) 50 MCG/ACT nasal spray Place 2 sprays into both nostrils daily. (Patient taking differently: Place 2 sprays into both  nostrils daily as needed for allergies. ) 16 g 6  . ibuprofen (ADVIL,MOTRIN) 200 MG tablet Take 400-600 mg by mouth every 6 (six) hours as needed for moderate pain (depends on pain if takes 2-3 tablets).     . losartan (COZAAR) 100 MG tablet TAKE 1 TABLET(100 MG) BY MOUTH DAILY 30 tablet 0  . EPIPEN 2-PAK 0.3 MG/0.3ML SOAJ injection Inject 0.3 mg into the skin once.   1  . HYDROcodone-acetaminophen (NORCO/VICODIN) 5-325 MG tablet Take 1-2 tablets by mouth every 4 (four) hours as needed for moderate pain or severe pain. (Patient not taking: Reported on 06/14/2017) 15 tablet 0  . HYDROcodone-homatropine (HYCODAN) 5-1.5 MG/5ML syrup Take 5 mLs by mouth every 8 (eight) hours as needed for cough. (Patient not taking: Reported on 06/14/2017) 120 mL 0  . nitrofurantoin, macrocrystal-monohydrate, (MACROBID) 100 MG capsule Take as directed (Patient not taking: Reported on 06/14/2017) 30 capsule 3  . valACYclovir (VALTREX) 500 MG tablet Take 500 mg by mouth 2 (two) times daily as needed (cold sores).      No current facility-administered medications for this encounter.     REVIEW OF SYSTEMS: As above.   PHYSICAL EXAM:  height is 5' 6" (1.676 m) and weight is 174 lb 12.8 oz (79.3 kg). Her temperature is 98.4 F (36.9 C). Her blood pressure is 130/71 and her pulse is 72. Her oxygen saturation is 100%.   General: Alert and oriented, in no acute distress HEENT: Head is normocephalic. Extraocular movements are intact. Oropharynx is clear. Neck: Neck is supple, no palpable cervical or supraclavicular lymphadenopathy. Heart: Regular in rate and rhythm with no murmurs, rubs, or gallops. Chest: Clear to auscultation bilaterally, with no rhonchi, wheezes, or rales. Extremities: No cyanosis or edema. Lymphatics: see Neck Exam Skin: No concerning lesions. Musculoskeletal: symmetric strength and muscle tone throughout. Neurologic: Cranial nerves II through XII are grossly intact. No obvious focalities. Speech is  fluent. Coordination is intact. Psychiatric: Judgment and insight are intact. Affect is appropriate. Breasts: No palpable right breast or axillary masses. Left breast lumpectomy and axillary scars are healing very well. No other palpable masses appreciated in the breasts or axillae.    LABORATORY DATA:  Lab Results  Component Value Date   WBC 10.7 (H) 05/12/2017   HGB 12.1 05/12/2017   HCT 36.5 05/12/2017   MCV 95.8 05/12/2017   PLT 330 05/12/2017   CMP     Component Value Date/Time   NA 136 05/12/2017 1004   NA 140 04/28/2017 0841   K 4.1 05/12/2017 1004   K 3.7 04/28/2017 0841   CL 101 05/12/2017 1004   CO2 25 05/12/2017 1004   CO2 27 04/28/2017 0841   GLUCOSE 117 (H) 05/12/2017 1004   GLUCOSE 109 04/28/2017 0841   BUN 9 05/12/2017 1004   BUN 14.7 04/28/2017 0841   CREATININE 0.65 05/12/2017 1004   CREATININE 0.7 04/28/2017 0841   CALCIUM 9.7 05/12/2017 1004   CALCIUM 9.7 04/28/2017 0841   PROT 7.0 04/28/2017 0841   ALBUMIN 4.2 04/28/2017 0841   AST  21 04/28/2017 0841   ALT 21 04/28/2017 0841   ALKPHOS 68 04/28/2017 0841   BILITOT 0.58 04/28/2017 0841   GFRNONAA >60 05/12/2017 1004   GFRAA >60 05/12/2017 1004         RADIOGRAPHY: No results found.    IMPRESSION/PLAN:  Left breast cancer  It was a pleasure meeting the patient again today. We discussed the risks, benefits, and side effects of radiotherapy. I recommend radiotherapy to the left breast to reduce her risk of locoregional recurrence by 2/3.  We discussed that radiation would take approximately 4 weeks to complete. We reviewed the acute effects including skin irritation and fatigue as well as much less common late effects including internal organ injury or irritation. We spoke about the latest technology that is used to minimize the risk of late effects for patients undergoing radiotherapy to the breast or chest wall. No guarantees of treatment were given. The patient is enthusiastic about proceeding with  treatment. I look forward to participating in the patient's care.  Discussed patient with Tumor Board on last week and noted there was no need for chemotherapy and to have the pt proceed with radiation therapy. Discussed this with pt who will come in on 8/1 for CT simulation prior to radiation therapy. She is pleased with the plan to proceed.    __________________________________________   Eppie Gibson, MD   This document serves as a record of services personally performed by Eppie Gibson, MD. It was created on her behalf by [facility], a trained medical scribe. The creation of this record is based on the scribe's personal observations and the provider's statements to them. This document has been checked and approved by the attending provider.

## 2017-06-16 ENCOUNTER — Ambulatory Visit
Admission: RE | Admit: 2017-06-16 | Discharge: 2017-06-16 | Disposition: A | Discharge status: Home / Self Care | Payer: BC Managed Care – PPO | Source: Ambulatory Visit | Attending: Radiation Oncology | Admitting: Radiation Oncology

## 2017-06-16 DIAGNOSIS — C50212 Malignant neoplasm of upper-inner quadrant of left female breast: Secondary | ICD-10-CM

## 2017-06-16 DIAGNOSIS — Z510 Encounter for antineoplastic radiation therapy: Secondary | ICD-10-CM | POA: Diagnosis not present

## 2017-06-16 DIAGNOSIS — Z170 Estrogen receptor positive status [ER+]: Secondary | ICD-10-CM

## 2017-06-16 NOTE — Progress Notes (Signed)
Radiation Oncology         [phone] ________________________________  Name: Kristina Sanders MRN: [id number]  Date: 06/16/2017  DOB: 1961-08-20  SIMULATION AND TREATMENT PLANNING NOTE  / Special treatment procedure  Outpatient  DIAGNOSIS:     ICD-10-CM   1. Malignant neoplasm of upper-inner quadrant of left breast in female, estrogen receptor positive ([place]) C50.212    Z17.0     NARRATIVE:  The patient was brought to the [facility].  Identity was confirmed.  All relevant records and images related to the planned course of therapy were reviewed.  The patient freely provided informed written consent to proceed with treatment after reviewing the details related to the planned course of therapy. The consent form was witnessed and verified by the simulation staff.    Then, the patient was set-up in a stable reproducible supine position for radiation therapy with her ipsilateral arm over her head, and her upper body secured in a custom-made Vac-lok device.  CT images were obtained.  Surface markings were placed.  The CT images were loaded into the planning software.    Special treatment procedure was performed today due to the extra time and effort required by myself to plan and prepare this patient for deep inspiration breath hold technique.  I have determined cardiac sparing to be of benefit to this patient to prevent long term cardiac damage due to radiation of the heart.  Bellows were placed on the patient's abdomen. To facilitate cardiac sparing, the patient was coached by the radiation therapists on breath hold techniques and breathing practice was performed. Practice waveforms were obtained. The patient was then scanned while maintaining breath hold in the treatment position.  This image was then transferred over to the imaging specialist. The imaging specialist then created a fusion of the free breathing and breath hold scans using the chest wall as the stable structure. I  personally reviewed the fusion in axial, coronal and sagittal image planes.  Excellent cardiac sparing was obtained.  I felt the patient is an appropriate candidate for breath hold and the patient will be treated as such.  The image fusion was then reviewed with the patient to reinforce the necessity of reproducible breath hold.  TREATMENT PLANNING NOTE: Treatment planning then occurred.  The radiation prescription was entered and confirmed.     A total of 3 medically necessary complex treatment devices were fabricated and supervised by me: 2 fields with MLCs for custom blocks to protect heart, and lungs;  and, a Vac-lok. MORE COMPLEX DEVICES MAY BE MADE IN DOSIMETRY FOR FIELD IN FIELD BEAMS FOR DOSE HOMOGENEITY.  I have requested : 3D Simulation which is medically necessary to give adequate dose to at risk tissues while sparing lungs and heart.  I have requested a DVH of the following structures: lungs, heart, lumpectomy cavity.    The patient will receive 40.05 Gy in 15 fractions to the left breast with 2 tangential fields.  This will be followed by a boost.  Optical Surface Tracking Plan:  Since intensity modulated radiotherapy (IMRT) and 3D conformal radiation treatment methods are predicated on accurate and precise positioning for treatment, intrafraction motion monitoring is medically necessary to ensure accurate and safe treatment delivery. The ability to quantify intrafraction motion without excessive ionizing radiation dose can only be performed with optical surface tracking. Accordingly, surface imaging offers the opportunity to obtain 3D measurements of patient position throughout IMRT and 3D treatments without excessive radiation exposure. I am ordering  optical surface tracking for this patient's upcoming course of radiotherapy.  ________________________________   Reference:  Ursula Alert, J, et al. Surface imaging-based analysis of intrafraction motion for breast  radiotherapy patients.Journal of [place], n. 6, nov. 2014. ISSN [id number].  Available at: <http://www.jacmp.org/index.php/jacmp/article/view/4957>.    -----------------------------------  Eppie Gibson, MD

## 2017-06-18 ENCOUNTER — Ambulatory Visit: Payer: BC Managed Care – PPO | Admitting: Obstetrics & Gynecology

## 2017-06-18 DIAGNOSIS — Z510 Encounter for antineoplastic radiation therapy: Secondary | ICD-10-CM | POA: Diagnosis not present

## 2017-06-23 ENCOUNTER — Ambulatory Visit
Admission: RE | Admit: 2017-06-23 | Discharge: 2017-06-23 | Disposition: A | Discharge status: Home / Self Care | Payer: BC Managed Care – PPO | Source: Ambulatory Visit | Attending: Radiation Oncology | Admitting: Radiation Oncology

## 2017-06-23 DIAGNOSIS — Z510 Encounter for antineoplastic radiation therapy: Secondary | ICD-10-CM | POA: Diagnosis not present

## 2017-06-24 ENCOUNTER — Ambulatory Visit
Admission: RE | Admit: 2017-06-24 | Discharge: 2017-06-24 | Disposition: A | Discharge status: Home / Self Care | Payer: BC Managed Care – PPO | Source: Ambulatory Visit | Attending: Radiation Oncology | Admitting: Radiation Oncology

## 2017-06-24 DIAGNOSIS — C50212 Malignant neoplasm of upper-inner quadrant of left female breast: Secondary | ICD-10-CM

## 2017-06-24 DIAGNOSIS — Z510 Encounter for antineoplastic radiation therapy: Secondary | ICD-10-CM | POA: Diagnosis not present

## 2017-06-24 DIAGNOSIS — Z170 Estrogen receptor positive status [ER+]: Secondary | ICD-10-CM

## 2017-06-24 MED ORDER — RADIAPLEXRX EX GEL
Freq: Once | CUTANEOUS | Status: AC
  Administered 2017-06-24: 15:00:00 via TOPICAL

## 2017-06-24 MED ORDER — ALRA NON-METALLIC DEODORANT (RAD-ONC)
1.0000 "application " | Freq: Once | TOPICAL | Status: AC
  Administered 2017-06-24: 1 via TOPICAL

## 2017-06-24 NOTE — Progress Notes (Signed)

## 2017-06-25 ENCOUNTER — Ambulatory Visit
Admission: RE | Admit: 2017-06-25 | Discharge: 2017-06-25 | Disposition: A | Discharge status: Home / Self Care | Payer: BC Managed Care – PPO | Source: Ambulatory Visit | Attending: Radiation Oncology | Admitting: Radiation Oncology

## 2017-06-25 DIAGNOSIS — Z510 Encounter for antineoplastic radiation therapy: Secondary | ICD-10-CM | POA: Diagnosis not present

## 2017-06-28 ENCOUNTER — Ambulatory Visit
Admission: RE | Admit: 2017-06-28 | Discharge: 2017-06-28 | Disposition: A | Discharge status: Home / Self Care | Payer: BC Managed Care – PPO | Source: Ambulatory Visit | Attending: Radiation Oncology | Admitting: Radiation Oncology

## 2017-06-28 DIAGNOSIS — Z510 Encounter for antineoplastic radiation therapy: Secondary | ICD-10-CM | POA: Diagnosis not present

## 2017-06-29 ENCOUNTER — Ambulatory Visit
Admission: RE | Admit: 2017-06-29 | Discharge: 2017-06-29 | Disposition: A | Discharge status: Home / Self Care | Payer: BC Managed Care – PPO | Source: Ambulatory Visit | Attending: Radiation Oncology | Admitting: Radiation Oncology

## 2017-06-29 DIAGNOSIS — Z510 Encounter for antineoplastic radiation therapy: Secondary | ICD-10-CM | POA: Diagnosis not present

## 2017-06-30 ENCOUNTER — Ambulatory Visit
Admission: RE | Admit: 2017-06-30 | Discharge: 2017-06-30 | Disposition: A | Discharge status: Home / Self Care | Payer: BC Managed Care – PPO | Source: Ambulatory Visit | Attending: Radiation Oncology | Admitting: Radiation Oncology

## 2017-06-30 DIAGNOSIS — Z510 Encounter for antineoplastic radiation therapy: Secondary | ICD-10-CM | POA: Diagnosis not present

## 2017-07-01 ENCOUNTER — Ambulatory Visit
Admission: RE | Admit: 2017-07-01 | Discharge: 2017-07-01 | Disposition: A | Discharge status: Home / Self Care | Payer: BC Managed Care – PPO | Source: Ambulatory Visit | Attending: Radiation Oncology | Admitting: Radiation Oncology

## 2017-07-01 DIAGNOSIS — Z510 Encounter for antineoplastic radiation therapy: Secondary | ICD-10-CM | POA: Diagnosis not present

## 2017-07-02 ENCOUNTER — Ambulatory Visit
Admission: RE | Admit: 2017-07-02 | Discharge: 2017-07-02 | Disposition: A | Discharge status: Home / Self Care | Payer: BC Managed Care – PPO | Source: Ambulatory Visit | Attending: Radiation Oncology | Admitting: Radiation Oncology

## 2017-07-02 DIAGNOSIS — Z510 Encounter for antineoplastic radiation therapy: Secondary | ICD-10-CM | POA: Diagnosis not present

## 2017-07-05 ENCOUNTER — Ambulatory Visit
Admission: RE | Admit: 2017-07-05 | Discharge: 2017-07-05 | Disposition: A | Discharge status: Home / Self Care | Payer: BC Managed Care – PPO | Source: Ambulatory Visit | Attending: Radiation Oncology | Admitting: Radiation Oncology

## 2017-07-05 DIAGNOSIS — Z510 Encounter for antineoplastic radiation therapy: Secondary | ICD-10-CM | POA: Diagnosis not present

## 2017-07-06 ENCOUNTER — Ambulatory Visit
Admission: RE | Admit: 2017-07-06 | Discharge: 2017-07-06 | Disposition: A | Discharge status: Home / Self Care | Payer: BC Managed Care – PPO | Source: Ambulatory Visit | Attending: Radiation Oncology | Admitting: Radiation Oncology

## 2017-07-06 DIAGNOSIS — Z510 Encounter for antineoplastic radiation therapy: Secondary | ICD-10-CM | POA: Diagnosis not present

## 2017-07-07 ENCOUNTER — Ambulatory Visit
Admission: RE | Admit: 2017-07-07 | Discharge: 2017-07-07 | Disposition: A | Discharge status: Home / Self Care | Payer: BC Managed Care – PPO | Source: Ambulatory Visit | Attending: Radiation Oncology | Admitting: Radiation Oncology

## 2017-07-07 ENCOUNTER — Encounter: Payer: Self-pay | Admitting: Physician Assistant

## 2017-07-07 ENCOUNTER — Ambulatory Visit (INDEPENDENT_AMBULATORY_CARE_PROVIDER_SITE_OTHER): Payer: BC Managed Care – PPO | Admitting: Physician Assistant

## 2017-07-07 DIAGNOSIS — Z510 Encounter for antineoplastic radiation therapy: Secondary | ICD-10-CM | POA: Diagnosis not present

## 2017-07-07 DIAGNOSIS — I10 Essential (primary) hypertension: Secondary | ICD-10-CM

## 2017-07-07 MED ORDER — AMLODIPINE BESYLATE 5 MG PO TABS: ORAL_TABLET | ORAL | 1 refills | Status: DC

## 2017-07-07 MED ORDER — LOSARTAN POTASSIUM 100 MG PO TABS: ORAL_TABLET | ORAL | 1 refills | Status: DC

## 2017-07-07 NOTE — Progress Notes (Signed)
    MRN: [id number] DOB: [birth year]  Subjective:   Kristina Sanders is a 56 y.o. female presenting for follow up on Hypertension. Last follow up here was 6 months ago.  Currently managed with amlodipine 5mg  and losartan 100mg  daily. Patient is checking blood pressure at home, range is 001-749S systolic. Denies lightheadedness, dizziness, chronic headache, double vision, chest pain, shortness of breath, heart racing, palpitations, nausea, vomiting, abdominal pain, hematuria, lower leg swelling. Has been eating healthier. Has not been exercising much right now. Dx with breast cancer in 04/2017 s/p lumpectomey and going through radiation at this time. Denies smoking. Occasional alcohol use. Denies any other aggravating or relieving factors, no other questions or concerns.  Kristina Sanders has a current medication list which includes the following prescription(s): acetaminophen, amlodipine, famotidine, fexofenadine, fluticasone, ibuprofen, losartan, nitrofurantoin (macrocrystal-monohydrate), valacyclovir, epipen 2-pak, hydrocodone-acetaminophen, and hydrocodone-homatropine. Also is allergic to no known allergies.  Kristina Sanders  has a past medical history of Allergy; Anemia; GERD (gastroesophageal reflux disease); History of abnormal cervical Pap smear; HSV infection; HTN (hypertension); Malignant neoplasm of upper-inner quadrant of left breast in female, estrogen receptor positive ([place]) (04/22/2017); Ovarian cyst; Perennial allergic rhinitis; and Seasonal allergic rhinitis. Also  has a past surgical history that includes Cesarean section; excision of precancerous mole; Breast lumpectomy with radioactive seed and sentinel lymph node biopsy (Left, 05/17/2017); and Adenoidectomy.   Objective:   Vitals: BP 117/72   Pulse 84   Temp 99 F (37.2 C) (Oral)   Resp 16   Ht 5\' 6"  (1.676 m)   Wt 171 lb (77.6 kg)   LMP 07/17/2013 Comment: had slight spotting post insertion, IUD removed in 03/2017, blood  work confirmed- menopause   SpO2 97%   BMI 27.60 kg/m   Physical Exam  Constitutional: She is oriented to person, place, and time. She appears well-developed and well-nourished.  HENT:  Head: Normocephalic and atraumatic.  Eyes: Conjunctivae are normal.  Neck: Normal range of motion.  Cardiovascular: Normal rate, regular rhythm, normal heart sounds and intact distal pulses.   Pulmonary/Chest: Effort normal and breath sounds normal.  Musculoskeletal:       Right lower leg: She exhibits no swelling.       Left lower leg: She exhibits no swelling.  Neurological: She is alert and oriented to person, place, and time.  Skin: Skin is warm and dry.  Psychiatric: She has a normal mood and affect.  Vitals reviewed.   No results found for this or any previous visit (from the past 24 hour(s)).  Assessment and Plan :  1. Essential hypertension Well controlled. Last CMP 2 months ago normal. Pt encouraged to work on diet and exercise. Plan to follow up for CPE and bp reevaluation in 6 months. If bp still this well controlled, consider taking pt off amlodipine at that time.  - losartan (COZAAR) 100 MG tablet; TAKE 1 TABLET(100 MG) BY MOUTH DAILY  Dispense: 90 tablet; Refill: 1 - amLODipine (NORVASC) 5 MG tablet; TAKE 1 TABLET(5 MG) BY MOUTH DAILY  Dispense: 90 tablet; Refill:   Tenna Delaine, PA-C  [facility] [place] 07/07/2017 3:39 PM

## 2017-07-07 NOTE — Patient Instructions (Addendum)
Your blood pressure is going great! Plan to continue working on diet and exercise. Follow up in 6 months for complete physical exam. Make sure you fasting at this visit (i.e., no food or drink for 8 hours before arrival). Thank you for letting me participate in your health and well being.  DASH Eating Plan DASH stands for "Dietary Approaches to Stop Hypertension." The DASH eating plan is a healthy eating plan that has been shown to reduce high blood pressure (hypertension). It may also reduce your risk for type 2 diabetes, heart disease, and stroke. The DASH eating plan may also help with weight loss. What are tips for following this plan? General guidelines  Avoid eating more than 2,300 mg (milligrams) of salt (sodium) a day. If you have hypertension, you may need to reduce your sodium intake to 1,500 mg a day.  Limit alcohol intake to no more than 1 drink a day for nonpregnant women and 2 drinks a day for men. One drink equals 12 oz of beer, 5 oz of wine, or 1 oz of hard liquor.  Work with your health care provider to maintain a healthy body weight or to lose weight. Ask what an ideal weight is for you.  Get at least 30 minutes of exercise that causes your heart to beat faster (aerobic exercise) most days of the week. Activities may include walking, swimming, or biking.  Work with your health care provider or diet and nutrition specialist (dietitian) to adjust your eating plan to your individual calorie needs. Reading food labels  Check food labels for the amount of sodium per serving. Choose foods with less than 5 percent of the Daily Value of sodium. Generally, foods with less than 300 mg of sodium per serving fit into this eating plan.  To find whole grains, look for the word "whole" as the first word in the ingredient list. Shopping  Buy products labeled as "low-sodium" or "no salt added."  Buy fresh foods. Avoid canned foods and premade or frozen meals. Cooking  Avoid adding salt  when cooking. Use salt-free seasonings or herbs instead of table salt or sea salt. Check with your health care provider or pharmacist before using salt substitutes.  Do not fry foods. Cook foods using healthy methods such as baking, boiling, grilling, and broiling instead.  Cook with heart-healthy oils, such as olive, canola, soybean, or sunflower oil. Meal planning   Eat a balanced diet that includes: ? 5 or more servings of fruits and vegetables each day. At each meal, try to fill half of your plate with fruits and vegetables. ? Up to 6-8 servings of whole grains each day. ? Less than 6 oz of lean meat, poultry, or fish each day. A 3-oz serving of meat is about the same size as a deck of cards. One egg equals 1 oz. ? 2 servings of low-fat dairy each day. ? A serving of nuts, seeds, or beans 5 times each week. ? Heart-healthy fats. Healthy fats called Omega-3 fatty acids are found in foods such as flaxseeds and coldwater fish, like sardines, salmon, and mackerel.  Limit how much you eat of the following: ? Canned or prepackaged foods. ? Food that is high in trans fat, such as fried foods. ? Food that is high in saturated fat, such as fatty meat. ? Sweets, desserts, sugary drinks, and other foods with added sugar. ? Full-fat dairy products.  Do not salt foods before eating.  Try to eat at least 2 vegetarian meals  each week.  Eat more home-cooked food and less restaurant, buffet, and fast food.  When eating at a restaurant, ask that your food be prepared with less salt or no salt, if possible. What foods are recommended? The items listed may not be a complete list. Talk with your dietitian about what dietary choices are best for you. Grains Whole-grain or whole-wheat bread. Whole-grain or whole-wheat pasta. Brown rice. [place]. Bulgur. Whole-grain and low-sodium cereals. Pita bread. Low-fat, low-sodium crackers. Whole-wheat flour tortillas. Vegetables Fresh or frozen  vegetables (raw, steamed, roasted, or grilled). Low-sodium or reduced-sodium tomato and vegetable juice. Low-sodium or reduced-sodium tomato sauce and tomato paste. Low-sodium or reduced-sodium canned vegetables. Fruits All fresh, dried, or frozen fruit. Canned fruit in natural juice (without added sugar). Meat and other protein foods Skinless chicken or [place]. Ground chicken or [place]. Pork with fat trimmed off. Fish and seafood. Egg whites. Dried beans, peas, or lentils. Unsalted nuts, nut butters, and seeds. Unsalted canned beans. Lean cuts of beef with fat trimmed off. Low-sodium, lean deli meat. Dairy Low-fat (1%) or fat-free (skim) milk. Fat-free, low-fat, or reduced-fat cheeses. Nonfat, low-sodium ricotta or cottage cheese. Low-fat or nonfat yogurt. Low-fat, low-sodium cheese. Fats and oils Soft margarine without trans fats. Vegetable oil. Low-fat, reduced-fat, or light mayonnaise and salad dressings (reduced-sodium). Canola, safflower, olive, soybean, and sunflower oils. Avocado. Seasoning and other foods Herbs. Spices. Seasoning mixes without salt. Unsalted popcorn and pretzels. Fat-free sweets. What foods are not recommended? The items listed may not be a complete list. Talk with your dietitian about what dietary choices are best for you. Grains Baked goods made with fat, such as croissants, muffins, or some breads. Dry pasta or rice meal packs. Vegetables Creamed or fried vegetables. Vegetables in a cheese sauce. Regular canned vegetables (not low-sodium or reduced-sodium). Regular canned tomato sauce and paste (not low-sodium or reduced-sodium). Regular tomato and vegetable juice (not low-sodium or reduced-sodium). Angie Fava. Olives. Fruits Canned fruit in a light or heavy syrup. Fried fruit. Fruit in cream or butter sauce. Meat and other protein foods Fatty cuts of meat. Ribs. Fried meat. Berniece Salines. Sausage. Bologna and other processed lunch meats. Salami. Fatback. Hotdogs. Bratwurst.  Salted nuts and seeds. Canned beans with added salt. Canned or smoked fish. Whole eggs or egg yolks. Chicken or [place] with skin. Dairy Whole or 2% milk, cream, and half-and-half. Whole or full-fat cream cheese. Whole-fat or sweetened yogurt. Full-fat cheese. Nondairy creamers. Whipped toppings. Processed cheese and cheese spreads. Fats and oils Butter. Stick margarine. Lard. Shortening. Ghee. Bacon fat. Tropical oils, such as coconut, palm kernel, or palm oil. Seasoning and other foods Salted popcorn and pretzels. Onion salt, garlic salt, seasoned salt, table salt, and sea salt. Worcestershire sauce. Tartar sauce. Barbecue sauce. Teriyaki sauce. Soy sauce, including reduced-sodium. Steak sauce. Canned and packaged gravies. Fish sauce. Oyster sauce. Cocktail sauce. Horseradish that you find on the shelf. Ketchup. Mustard. Meat flavorings and tenderizers. Bouillon cubes. Hot sauce and Tabasco sauce. Premade or packaged marinades. Premade or packaged taco seasonings. Relishes. Regular salad dressings. Where to find more information:  National Heart, Lung, and [place]: https://wilson-eaton.com/  American Heart Association: www.heart.org Summary  The DASH eating plan is a healthy eating plan that has been shown to reduce high blood pressure (hypertension). It may also reduce your risk for type 2 diabetes, heart disease, and stroke.  With the DASH eating plan, you should limit salt (sodium) intake to 2,300 mg a day. If you have hypertension, you may need to reduce  your sodium intake to 1,500 mg a day.  When on the DASH eating plan, aim to eat more fresh fruits and vegetables, whole grains, lean proteins, low-fat dairy, and heart-healthy fats.  Work with your health care provider or diet and nutrition specialist (dietitian) to adjust your eating plan to your individual calorie needs. This information is not intended to replace advice given to you by your health care provider. Make sure you discuss any  questions you have with your health care provider. Document Released: 10/22/2011 Document Revised: 10/26/2016 Document Reviewed: 10/26/2016 Elsevier Interactive Patient Education  2017 [employer].   IF you received an x-ray today, you will receive an invoice from [facility]. Please contact [facility] at [phone] with questions or concerns regarding your invoice.   IF you received labwork today, you will receive an invoice from [facility]. Please contact [facility] at [phone] with questions or concerns regarding your invoice.   Our billing staff will not be able to assist you with questions regarding bills from these companies.  You will be contacted with the lab results as soon as they are available. The fastest way to get your results is to activate your My Chart account. Instructions are located on the last page of this paperwork. If you have not heard from [place] regarding the results in 2 weeks, please contact this office.

## 2017-07-08 ENCOUNTER — Ambulatory Visit
Admission: RE | Admit: 2017-07-08 | Discharge: 2017-07-08 | Disposition: A | Discharge status: Home / Self Care | Payer: BC Managed Care – PPO | Source: Ambulatory Visit | Attending: Radiation Oncology | Admitting: Radiation Oncology

## 2017-07-08 DIAGNOSIS — Z510 Encounter for antineoplastic radiation therapy: Secondary | ICD-10-CM | POA: Diagnosis not present

## 2017-07-09 ENCOUNTER — Ambulatory Visit: Payer: BC Managed Care – PPO

## 2017-07-12 ENCOUNTER — Ambulatory Visit
Admission: RE | Admit: 2017-07-12 | Discharge: 2017-07-12 | Disposition: A | Discharge status: Home / Self Care | Payer: BC Managed Care – PPO | Source: Ambulatory Visit | Attending: Radiation Oncology | Admitting: Radiation Oncology

## 2017-07-12 DIAGNOSIS — Z510 Encounter for antineoplastic radiation therapy: Secondary | ICD-10-CM | POA: Diagnosis not present

## 2017-07-13 ENCOUNTER — Ambulatory Visit
Admission: RE | Admit: 2017-07-13 | Discharge: 2017-07-13 | Disposition: A | Discharge status: Home / Self Care | Payer: BC Managed Care – PPO | Source: Ambulatory Visit | Attending: Radiation Oncology | Admitting: Radiation Oncology

## 2017-07-13 DIAGNOSIS — Z510 Encounter for antineoplastic radiation therapy: Secondary | ICD-10-CM | POA: Diagnosis not present

## 2017-07-14 ENCOUNTER — Ambulatory Visit
Admission: RE | Admit: 2017-07-14 | Discharge: 2017-07-14 | Disposition: A | Discharge status: Home / Self Care | Payer: BC Managed Care – PPO | Source: Ambulatory Visit | Attending: Radiation Oncology | Admitting: Radiation Oncology

## 2017-07-14 DIAGNOSIS — Z510 Encounter for antineoplastic radiation therapy: Secondary | ICD-10-CM | POA: Diagnosis not present

## 2017-07-15 ENCOUNTER — Ambulatory Visit
Admission: RE | Admit: 2017-07-15 | Discharge: 2017-07-15 | Disposition: A | Discharge status: Home / Self Care | Payer: BC Managed Care – PPO | Source: Ambulatory Visit | Attending: Radiation Oncology | Admitting: Radiation Oncology

## 2017-07-15 DIAGNOSIS — Z510 Encounter for antineoplastic radiation therapy: Secondary | ICD-10-CM | POA: Diagnosis not present

## 2017-07-16 ENCOUNTER — Ambulatory Visit
Admission: RE | Admit: 2017-07-16 | Discharge: 2017-07-16 | Disposition: A | Discharge status: Home / Self Care | Payer: BC Managed Care – PPO | Source: Ambulatory Visit | Attending: Radiation Oncology | Admitting: Radiation Oncology

## 2017-07-16 DIAGNOSIS — Z510 Encounter for antineoplastic radiation therapy: Secondary | ICD-10-CM | POA: Diagnosis not present

## 2017-07-16 DIAGNOSIS — Z170 Estrogen receptor positive status [ER+]: Secondary | ICD-10-CM

## 2017-07-16 DIAGNOSIS — C50212 Malignant neoplasm of upper-inner quadrant of left female breast: Secondary | ICD-10-CM

## 2017-07-16 MED ORDER — RADIAPLEXRX EX GEL
Freq: Once | CUTANEOUS | Status: AC
  Administered 2017-07-16: 16:00:00 via TOPICAL

## 2017-07-20 ENCOUNTER — Ambulatory Visit
Admission: RE | Admit: 2017-07-20 | Discharge: 2017-07-20 | Disposition: A | Discharge status: Home / Self Care | Payer: BC Managed Care – PPO | Source: Ambulatory Visit | Attending: Radiation Oncology | Admitting: Radiation Oncology

## 2017-07-20 DIAGNOSIS — Z510 Encounter for antineoplastic radiation therapy: Secondary | ICD-10-CM | POA: Diagnosis not present

## 2017-07-21 ENCOUNTER — Ambulatory Visit
Admission: RE | Admit: 2017-07-21 | Discharge: 2017-07-21 | Disposition: A | Discharge status: Home / Self Care | Payer: BC Managed Care – PPO | Source: Ambulatory Visit | Attending: Radiation Oncology | Admitting: Radiation Oncology

## 2017-07-21 DIAGNOSIS — Z510 Encounter for antineoplastic radiation therapy: Secondary | ICD-10-CM | POA: Diagnosis not present

## 2017-07-21 NOTE — Progress Notes (Signed)
[place]  [phone] Fax:([phone]  Clinic Follow up Note   Patient Care Team: Donzetta Kohut as PCP - General (Physician Assistant) Megan Salon, MD as Consulting Physician (Gynecology) Harold Hedge, Darrick Grinder, MD (Allergy and Immunology) Truitt Merle, MD as Consulting Physician (Hematology) Jovita Kussmaul, MD as Consulting Physician (General Surgery) Eppie Gibson, MD as Attending Physician (Radiation Oncology) 07/23/2017  CHIEF COMPLAINTS:  Follow up of a Malignant neoplasm of upper-inner quadrant of left breast in female, estrogen receptor positive.   Oncology History   Cancer Staging Malignant neoplasm of upper-inner quadrant of left breast in female, estrogen receptor positive ([place]) Staging form: Breast, AJCC 8th Edition - Clinical stage from 04/19/2017: Stage IA (cT1b, cN0, cM0, G2, ER: Positive, PR: Positive, HER2: Negative) - Signed by Truitt Merle, MD on 04/28/2017 - Pathologic stage from 05/17/2017: Stage IA (pT1c, pN0, cM0, G2, ER: Positive, PR: Positive, HER2: Negative) - Signed by Truitt Merle, MD on 07/24/2017       Malignant neoplasm of upper-inner quadrant of left breast in female, estrogen receptor positive ([place])   04/15/2017 Mammogram    Diagnostic mammogram and ultrasound of left breast:  The new 1.2 cm oval solid mass in the left breast 11:00 position, posterior depth, is highly suspicious for malignancy. The grouped mucus of calcium calcifications in the left breast at the 1:00 and 10:00 middle depth are benign. Ultrasound of the left axilla was negative for adenopathy.      04/19/2017 Receptors her2    Estrogen Receptor: 100%, POSITIVE, STRONG STAINING INTENSITY Progesterone Receptor: 100%, POSITIVE, STRONG STAINING INTENSITY Proliferation Marker Ki67: 30% HER2 (-)      04/19/2017 Initial Biopsy    Breast, left, needle core biopsy INVASIVE DUCTAL CARCINOMA, GRADE 1 DUCTAL CARCINOMA IN SITU, GRADE 1      04/22/2017 Initial  Diagnosis    Malignant neoplasm of upper-inner quadrant of left breast in female, estrogen receptor positive ([place])      05/17/2017 Surgery    LEFT BREAST LUMPECTOMY WITH RADIOACTIVE SEED AND SENTINEL LYMPH NODE BIOPSY by Dr. Marlou Starks      05/17/2017 Pathology Results    Diagnosis 05/17/17 1. 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 Lymph node, sentinel, biopsy, Left axillary ONE LYMPH NODE, NEGATIVE FOR CARCINOMA (0/1) 3. Lymph node, sentinel, biopsy, Left axillary ONE LYMPH NODE, NEGATIVE FOR CARCINOMA (0/1) 4. Breast, excision, Left new anterior margin SMALL FOCUS OF DUCTAL CARCINOMA IN SITU THE CARCINOMA IS 1 MM FROM THE ANTERIOR INKED MARGIN 5. Breast, excision, Left new medial margin BENIGN BREAST TISSUE NEGATIVE FOR CARCINOMA 6. Breast, excision, Left new superior margin FIBROCYSTIC CHANGES WITH CALCIFICATIONS NEGATIVE FOR CARCINOMA 7. Breast, excision, Left new lateral margin FIBROCYSTIC CHANGES WITH CALCIFICATIONS USUAL DUCTAL HYPERPLASIA NEGATIVE FOR CARCINOMA        05/17/2017 Miscellaneous    Oncotype   Recurrence score: 0 Low Risk 10-year risk of recurrence of 3% with Tamoxifen alone      06/24/2017 - 07/23/2017 Radiation Therapy    Radiation with Dr. Isidore Moos       Anti-estrogen oral therapy    Anastrozole 1 mg daily to start in 2-3 weeks        HISTORY OF PRESENTING ILLNESS: 04/28/17 Kristina Sanders 56 y.o. female is here because  of recent diagnosis of a Malignant neoplasm of upper-inner quadrant of left breast in female, estrogen receptor positive. She presents to the Multidisciplinary breast clinic today with her husband.  In the past she normally gets a mammogram screening but missed 2016. She did not feel any lump or had an abnormal mammogram.  Her HTN is controlled. She had her 1st child at 29 years old. Due to menorrhagia she was using mirena for 5 years. She had her mirena removed and was diagnosed as postmenopausal.    She reports to purposefully loosing weight because she is pre-diabetic and feels no other change in her body. Sometimes she gets cold sores.     GYN HISTORY  Menarchal: 12 LMP: in over 5 years due to Contraception  Contraceptive: mirena for 5 years  HRT: mirena was removed in 03/2017 G3P3    CURRENT THERAPY: Anastrozole 1 mg daily to start in 2-3 weeks   INTERVAL HISTORY:  Kristina Sanders is here for a follow up and last radiation treatment. She presents to the clinic today with her husband. She completed radiation and tolerated well. She has some irritation and soreness left over.  She has not had bone density scan before and she does not have arthritis or current joint pain.    MEDICAL HISTORY:  Past Medical History:  Diagnosis Date  . Allergy   . Anemia    hx  . GERD (gastroesophageal reflux disease)   . History of abnormal cervical Pap smear   . HSV infection    h/o  . HTN (hypertension)   . Malignant neoplasm of upper-inner quadrant of left breast in female, estrogen receptor positive ([place]) 04/22/2017  . Ovarian cyst    h/o  . Perennial allergic rhinitis    [place]   . Seasonal allergic rhinitis    [place]    SURGICAL HISTORY: Past Surgical History:  Procedure Laterality Date  . ADENOIDECTOMY     only adenoids still have tonsils  . BREAST LUMPECTOMY WITH RADIOACTIVE SEED AND SENTINEL LYMPH NODE BIOPSY Left 05/17/2017   Procedure: LEFT BREAST LUMPECTOMY WITH RADIOACTIVE SEED AND SENTINEL LYMPH NODE BIOPSY;  Surgeon: Jovita Kussmaul, MD;  Location: [facility];  Service: General;  Laterality: Left;  . CESAREAN SECTION     x3  . excision of precancerous mole     lft arm, chest    SOCIAL HISTORY: Social History   Social History  . Marital status: Married    Spouse name: N/A  . Number of  children: N/A  . Years of education: N/A   Occupational History  . Not on file.   Social History Main Topics  . Smoking status: Never Smoker  . Smokeless tobacco: Never Used  . Alcohol use 2.4 - 3.6 oz/week    4 - 6 Standard drinks or equivalent per week     Comment: occ  . Drug use: No  . Sexual activity: Yes    Partners: Male    Birth control/ protection: Other-see comments     Comment: vasectomy   Other Topics Concern  . Not on file   Social History Narrative  . No narrative on file    FAMILY HISTORY: Family History  Problem Relation Age of Onset  . Mitral valve prolapse Mother   . Prostate cancer Father   . Transient ischemic attack Father   . Breast cancer Paternal Aunt 62       bile duct cancer  . Colon cancer Neg Hx   .  Rectal cancer Neg Hx   . Stomach cancer Neg Hx   . Esophageal cancer Neg Hx     ALLERGIES:  is allergic to no known allergies.  MEDICATIONS:  Current Outpatient Prescriptions  Medication Sig Dispense Refill  . acetaminophen (TYLENOL) 500 MG tablet Take 1,000 mg by mouth every 6 (six) hours as needed for mild pain.    Marland Kitchen amLODipine (NORVASC) 5 MG tablet TAKE 1 TABLET(5 MG) BY MOUTH DAILY 90 tablet 1  . EPIPEN 2-PAK 0.3 MG/0.3ML SOAJ injection Inject 0.3 mg into the skin once.   1  . famotidine (PEPCID) 20 MG tablet Take 20 mg by mouth daily as needed for heartburn or indigestion.    . fexofenadine (ALLEGRA) 180 MG tablet Take 180 mg by mouth as needed.     . fluticasone (FLONASE) 50 MCG/ACT nasal spray Place 2 sprays into both nostrils daily. (Patient taking differently: Place 2 sprays into both nostrils daily as needed for allergies. ) 16 g 6  . ibuprofen (ADVIL,MOTRIN) 200 MG tablet Take 400-600 mg by mouth every 6 (six) hours as needed for moderate pain (depends on pain if takes 2-3 tablets).     . losartan (COZAAR) 100 MG tablet TAKE 1 TABLET(100 MG) BY MOUTH DAILY 90 tablet 1  . HYDROcodone-homatropine (HYCODAN) 5-1.5 MG/5ML syrup Take 5  mLs by mouth every 8 (eight) hours as needed for cough. (Patient not taking: Reported on 06/14/2017) 120 mL 0  . nitrofurantoin, macrocrystal-monohydrate, (MACROBID) 100 MG capsule Take as directed (Patient not taking: Reported on 07/23/2017) 30 capsule 3  . valACYclovir (VALTREX) 500 MG tablet Take 500 mg by mouth 2 (two) times daily as needed (cold sores).      No current facility-administered medications for this visit.     REVIEW OF SYSTEMS:  Constitutional: Denies fevers, chills or abnormal night sweats (+) purposeful weight loss Eyes: Denies blurriness of vision, double vision or watery eyes Ears, nose, mouth, throat, and face: Denies mucositis or sore throat Respiratory: Denies cough, dyspnea or wheezes Cardiovascular: Denies palpitation, chest discomfort or lower extremity swelling Gastrointestinal:  Denies nausea, heartburn or change in bowel habits Skin: Denies abnormal skin rashes Lymphatics: Denies new lymphadenopathy or easy bruising Neurological:Denies numbness, tingling or new weaknesses BREAST: (+) Soreness and skin irritation due to radiaiton Behavioral/Psych: Mood is stable, no new changes  All other systems were reviewed with the patient and are negative.  PHYSICAL EXAMINATION: ECOG PERFORMANCE STATUS: 0 - Asymptomatic  Vitals:   07/23/17 1631  BP: (!) 145/67  Pulse: 80  Resp: 18  Temp: 97.9 F (36.6 C)  SpO2: 100%   Filed Weights   07/23/17 1631  Weight: 174 lb 1.6 oz (79 kg)    GENERAL:alert, no distress and comfortable SKIN: skin color, texture, turgor are normal, no rashes or significant lesions EYES: normal, conjunctiva are pink and non-injected, sclera clear OROPHARYNX:no exudate, no erythema and lips, buccal mucosa, and tongue normal  NECK: supple, thyroid normal size, non-tender, without nodularity LYMPH:  no palpable lymphadenopathy in the cervical, axillary or inguinal LUNGS: clear to auscultation and percussion with normal breathing  effort HEART: regular rate & rhythm and no murmurs and no lower extremity edema ABDOMEN:abdomen soft, non-tender and normal bowel sounds Musculoskeletal:no cyanosis of digits and no clubbing  PSYCH: alert & oriented x 3 with fluent speech NEURO: no focal motor/sensory deficits Breasts: Breast inspection showed them to be symmetrical with no nipple discharge. Palpation of the breasts and axilla revealed no obvious mass that I could  appreciate. (+) incision in left axilla and around arora, healed well. (+) diffuse skin erythema of left breast with no ulcers or discharge.    LABORATORY DATA:  I have reviewed the data as listed CBC Latest Ref Rng & Units 05/12/2017 04/28/2017 07/29/2014  WBC 4.0 - 10.5 K/uL 10.7(H) 6.3 -  Hemoglobin 12.0 - 15.0 g/dL 12.1 12.8 11.9(L)  Hematocrit 36.0 - 46.0 % 36.5 39.1 35.0(L)  Platelets 150 - 400 K/uL 330 225 -    CMP Latest Ref Rng & Units 05/12/2017 04/28/2017 12/09/2016  Glucose 65 - 99 mg/dL 117(H) 109 100(H)  BUN 6 - 20 mg/dL 9 14.7 14  Creatinine 0.44 - 1.00 mg/dL 0.65 0.7 0.45(L)  Sodium 135 - 145 mmol/L 136 140 141  Potassium 3.5 - 5.1 mmol/L 4.1 3.7 4.2  Chloride 101 - 111 mmol/L 101 - 99  CO2 22 - 32 mmol/L 25 27 27   Calcium 8.9 - 10.3 mg/dL 9.7 9.7 9.9  Total Protein 6.4 - 8.3 g/dL - 7.0 6.9  Total Bilirubin 0.20 - 1.20 mg/dL - 0.58 0.5  Alkaline Phos 40 - 150 U/L - 68 64  AST 5 - 34 U/L - 21 19  ALT 0 - 55 U/L - 21 25   PATHOLOGY   Diagnosis 05/17/17 1. 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 Lymph node, sentinel, biopsy, Left axillary ONE LYMPH NODE, NEGATIVE FOR CARCINOMA (0/1) 3. Lymph node, sentinel, biopsy, Left axillary ONE LYMPH NODE, NEGATIVE FOR CARCINOMA (0/1) 4. Breast, excision, Left new anterior  margin SMALL FOCUS OF DUCTAL CARCINOMA IN SITU THE CARCINOMA IS 1 MM FROM THE ANTERIOR INKED MARGIN 5. Breast, excision, Left new medial margin BENIGN BREAST TISSUE NEGATIVE FOR CARCINOMA 6. Breast, excision, Left new superior margin FIBROCYSTIC CHANGES WITH CALCIFICATIONS NEGATIVE FOR CARCINOMA 7. Breast, excision, Left new lateral margin FIBROCYSTIC CHANGES WITH CALCIFICATIONS USUAL DUCTAL HYPERPLASIA NEGATIVE FOR CARCINOMA Microscopic Comment 1. BREAST, INVASIVE TUMOR Procedure: lumpectomy Laterality: Left Tumor Size: 1.4 cm Histologic Type: ductal carcinoma Grade: Tubular Differentiation: 3 Microscopic Comment(continued) Nuclear Pleomorphism: 2 Mitotic Count: 1 Ductal Carcinoma in Situ (DCIS): Present Extent of Tumor: Skin: Negative Nipple: Negative Skeletal muscle: Negative Margins: Invasive carcinoma, distance from closest margin: 0.4 cm from the anterior margin DCIS, distance from closest margin: 0.1 cm from the anterior margin (part 4) Regional Lymph Nodes: Number of Lymph Nodes Examined: Number of Sentinel Lymph Nodes Examined: 2 Lymph Nodes with Macrometastases: 0 Lymph Nodes with Micrometastases: 0 Lymph Nodes with Isolated Tumor Cells: 0 Breast Prognostic Profile: [id number] Estrogen Receptor: 100% Progesterone Receptor: 100% Her2: Negative Ki-67: 30% Best tumor block for sendout testing: 1A Pathologic Stage Classification (pTNM, AJCC 8th Edition): Primary Tumor (pT): pT1c Regional Lymph Nodes (pN): pN0 Distant Metastases (pM): pMx   Breast, left, needle core biopsy 04/19/17 INVASIVE DUCTAL CARCINOMA, GRADE 1 DUCTAL CARCINOMA IN SITU, GRADE 1 Estrogen Receptor: 100%, POSITIVE, STRONG STAINING INTENSITY Progesterone Receptor: 100%, POSITIVE, STRONG STAINING INTENSITY Proliferation Marker Ki67: 30% HER2 (-)  ONCOTYPE 05/17/17    RADIOGRAPHIC STUDIES: I have personally reviewed the radiological images as listed and agreed with the findings in the  report. No results found.  Diagnostic mammogram and ultrasound of left breast on 04/15/2017 Impression; The new 1.2 cm oval solid mass in the left breast 11:00 position, posterior depth, is  highly suspicious for malignancy. Biopsy is recommended. Ultrasound of the left axilla was negative for adenopathy.  ASSESSMENT & PLAN:  Kristina Sanders is a 56 y.o. caucasian female who has a history of Anemia, HTN, and Ovarian cycts.   1.  Malignant neoplasm of upper-inner quadrant of left breast in female, pT1cN0M0, stage IA, ER+/PR+/HER2-,  Grade 2 ---I discussed her surgical path result in details -the Oncotype Dx result was reviewed with her in details. She has very low risk based on the recurrence score 0, which predicts 10 year distant recurrence after 5 years of tamoxifen 3%. There is no benefit of chemotherapy in the low risk group. -she had blood test with Dr. Sabra Heck on 02/26/17 had [place] as 20, she is postmenopausal. --Given the strong ER and PR positivity In the postmenopausal status, I do recommend adjuvant aromatase inhibitor to reduce her risk of cancer recurrence,  The potential benefit and side effects, which includes but not limited to, hot flash, skin and vaginal dryness, metabolic changes ( increased blood glucose, cholesterol, weight, etc.), slightly in increased risk of cardiovascular disease, cataracts, muscular and joint discomfort, osteopenia and osteoporosis, etc, were discussed with her in great details. She is interested. -I prescribed her anastrozole 1 mg daily to start in 3-4 weeks.  -We also discussed the breast cancer surveillance. She will continue annual screening mammogram, self exam, and a routine office visit with lab and exam with [place]. -pt agrees to go to survivorship clinic soon.    2. HTN -Continue medications and follow-up of his PCP  3. Bone Health -Since she is starting Anastrozole and is postmenopausal I will order a bone scan to repeat every 2 years.    Plan -Order Anastrozole 1 mg daily to start in 3-4 weeks   -lab and f/u in 3 months  -survivorship clinic scheduled in near future or after next visit.    No orders of the defined types were placed in this encounter.   All questions were answered. The patient knows to call the clinic with any problems, questions or concerns. I spent 25 minutes counseling the patient face to face. The total time spent in the appointment was 30 minutes and more than 50% was on counseling.  This document serves as a record of services personally performed by Truitt Merle, MD. It was created on her behalf by Joslyn Devon, a trained medical scribe. The creation of this record is based on the scribe's personal observations and the provider's statements to them. This document has been checked and approved by the attending provider.      Truitt Merle, MD 07/23/2017 4:42 PM

## 2017-07-22 ENCOUNTER — Ambulatory Visit
Admission: RE | Admit: 2017-07-22 | Discharge: 2017-07-22 | Disposition: A | Discharge status: Home / Self Care | Payer: BC Managed Care – PPO | Source: Ambulatory Visit | Attending: Radiation Oncology | Admitting: Radiation Oncology

## 2017-07-22 DIAGNOSIS — Z510 Encounter for antineoplastic radiation therapy: Secondary | ICD-10-CM | POA: Diagnosis not present

## 2017-07-23 ENCOUNTER — Encounter: Payer: Self-pay | Admitting: *Deleted

## 2017-07-23 ENCOUNTER — Ambulatory Visit
Admission: RE | Admit: 2017-07-23 | Discharge: 2017-07-23 | Disposition: A | Discharge status: Home / Self Care | Payer: BC Managed Care – PPO | Source: Ambulatory Visit | Attending: Radiation Oncology | Admitting: Radiation Oncology

## 2017-07-23 ENCOUNTER — Ambulatory Visit (HOSPITAL_BASED_OUTPATIENT_CLINIC_OR_DEPARTMENT_OTHER): Payer: BC Managed Care – PPO | Admitting: Hematology

## 2017-07-23 ENCOUNTER — Encounter: Payer: Self-pay | Admitting: Radiation Oncology

## 2017-07-23 VITALS — BP 145/67 | HR 80 | Temp 97.9°F | Resp 18 | Ht 66.0 in | Wt 174.1 lb

## 2017-07-23 DIAGNOSIS — Z170 Estrogen receptor positive status [ER+]: Secondary | ICD-10-CM

## 2017-07-23 DIAGNOSIS — C50212 Malignant neoplasm of upper-inner quadrant of left female breast: Secondary | ICD-10-CM | POA: Diagnosis not present

## 2017-07-23 DIAGNOSIS — Z510 Encounter for antineoplastic radiation therapy: Secondary | ICD-10-CM | POA: Diagnosis not present

## 2017-07-23 MED ORDER — ANAGRELIDE HCL 1 MG PO CAPS: 1.0000 mg | ORAL_CAPSULE | Freq: Two times a day (BID) | ORAL | 2 refills | Status: DC

## 2017-07-24 ENCOUNTER — Encounter: Payer: Self-pay | Admitting: Hematology

## 2017-07-26 ENCOUNTER — Telehealth: Payer: Self-pay | Admitting: Hematology

## 2017-07-26 NOTE — Telephone Encounter (Signed)
Per 9/7 - no los at checkout

## 2017-07-27 NOTE — Progress Notes (Signed)
  Radiation Oncology         [phone] ________________________________  Name: Kristina Sanders MRN: [id number]  Date: 07/23/2017  DOB: 01-25-1961  End of Treatment Note  Diagnosis:   56 y.o. female with Clinical Stage IA (T1b, N0, M0) LeftBreast UIQ Invasive Ductal Carcinoma with DCIS, ER(+)/ PR(+)/ Her2 (-), Grade 2 and Pathologic Stage T1c, N0     Indication for treatment:  Curative       Radiation treatment dates:   06/24/2017 - 07/23/2017  Site/dose:   The left breast was treated to a total of 50.05 Gy, with a primary dose of 40.05 Gy delivered in 15 fractions followed by a 10 Gy boost delivered in 5 fractions.  Beams/energy:    Primary: 3D // 6X, 10X Photon Boost: en face / 9E, 12E    Narrative: The patient tolerated radiation treatment relatively well with mild fatigue. Her primary complaint was some "skin sensitivity" to her left breast to the left of her nipple. She was noted to have raised bumps in this area. The skin to her left breast was red and itching. She uses cortisone cream 1% with some relief and RadiaPlex twice daily as directed.   Plan: The patient has completed radiation treatment. Instructions were given to continue using RadiaPlex gel for two more weeks. I advised her to switch to Vitamin E lotion after that. The patient will return to radiation oncology clinic for routine followup in one month. I advised them to call or return sooner if they have any questions or concerns related to their recovery or treatment.  -----------------------------------  Eppie Gibson, MD  This document serves as a record of services personally performed by Eppie Gibson, MD. It was created on her behalf by Rae Lips, a trained medical scribe. The creation of this record is based on the scribe's personal observations and the provider's statements to them. This document has been checked and approved by the attending provider.

## 2017-08-09 ENCOUNTER — Encounter: Payer: Self-pay | Admitting: Physician Assistant

## 2017-08-09 ENCOUNTER — Ambulatory Visit (INDEPENDENT_AMBULATORY_CARE_PROVIDER_SITE_OTHER): Payer: BC Managed Care – PPO | Admitting: Physician Assistant

## 2017-08-09 VITALS — BP 130/70 | HR 75 | Temp 98.9°F | Resp 16 | Ht 66.0 in | Wt 173.6 lb

## 2017-08-09 DIAGNOSIS — M542 Cervicalgia: Secondary | ICD-10-CM | POA: Diagnosis not present

## 2017-08-09 DIAGNOSIS — M62838 Other muscle spasm: Secondary | ICD-10-CM

## 2017-08-09 MED ORDER — CYCLOBENZAPRINE HCL 10 MG PO TABS: 10.0000 mg | ORAL_TABLET | Freq: Three times a day (TID) | ORAL | 0 refills | Status: DC | PRN

## 2017-08-09 MED ORDER — TRAMADOL HCL 50 MG PO TABS: 50.0000 mg | ORAL_TABLET | Freq: Four times a day (QID) | ORAL | 0 refills | Status: DC | PRN

## 2017-08-09 NOTE — Progress Notes (Signed)
Kristina Sanders  MRN: [id number] DOB: 05/21/1961  PCP: Leonie Douglas, PA-C  Subjective:  Pt is a 56 year old female who presents to clinic for neck pain x 5 days. She thinks she pulled muscle in neck.   Of note, she was treated for breast cancer with radiation therapy. Since that time she has been "babying the left side of her torso and neck. She recently rearranged items in her closet, which is increased physical activity for her. She cannot turn her head to either side.  Denies weakness,   Review of Systems  Constitutional: Negative for chills, diaphoresis, fatigue and fever.  Respiratory: Negative for chest tightness.   Cardiovascular: Negative for chest pain and palpitations.  Gastrointestinal: Negative for nausea and vomiting.  Musculoskeletal: Positive for myalgias and neck pain. Negative for neck stiffness.  Neurological: Negative for weakness and numbness.    Patient Active Problem List   Diagnosis Date Noted  . Malignant neoplasm of upper-inner quadrant of left breast in female, estrogen receptor positive ([place]) 04/22/2017  . Allergic rhinitis due to other allergen 10/08/2012  . HTN (hypertension)     Current Outpatient Prescriptions on File Prior to Visit  Medication Sig Dispense Refill  . acetaminophen (TYLENOL) 500 MG tablet Take 1,000 mg by mouth every 6 (six) hours as needed for mild pain.    Marland Kitchen amLODipine (NORVASC) 5 MG tablet TAKE 1 TABLET(5 MG) BY MOUTH DAILY 90 tablet 1  . anagrelide (AGRYLIN) 1 MG capsule Take 1 capsule (1 mg total) by mouth 2 (two) times daily. 30 capsule 2  . EPIPEN 2-PAK 0.3 MG/0.3ML SOAJ injection Inject 0.3 mg into the skin once.   1  . famotidine (PEPCID) 20 MG tablet Take 20 mg by mouth daily as needed for heartburn or indigestion.    . fexofenadine (ALLEGRA) 180 MG tablet Take 180 mg by mouth as needed.     . fluticasone (FLONASE) 50 MCG/ACT nasal spray Place 2 sprays into both nostrils daily. (Patient taking differently: Place 2  sprays into both nostrils daily as needed for allergies. ) 16 g 6  . ibuprofen (ADVIL,MOTRIN) 200 MG tablet Take 400-600 mg by mouth every 6 (six) hours as needed for moderate pain (depends on pain if takes 2-3 tablets).     . losartan (COZAAR) 100 MG tablet TAKE 1 TABLET(100 MG) BY MOUTH DAILY 90 tablet 1  . nitrofurantoin, macrocrystal-monohydrate, (MACROBID) 100 MG capsule Take as directed 30 capsule 3  . valACYclovir (VALTREX) 500 MG tablet Take 500 mg by mouth 2 (two) times daily as needed (cold sores).      No current facility-administered medications on file prior to visit.     Allergies  Allergen Reactions  . No Known Allergies      Objective:  BP 130/70   Pulse 75   Temp 98.9 F (37.2 C) (Oral)   Resp 16   Ht 5\' 6"  (1.676 m)   Wt 173 lb 9.6 oz (78.7 kg)   LMP 07/17/2013 Comment: had slight spotting post insertion, IUD removed in 03/2017, blood  work confirmed- menopause  SpO2 98%   BMI 28.02 kg/m   Physical Exam  Constitutional: She is oriented to person, place, and time and well-developed, well-nourished, and in no distress. No distress.  Neck:    Cardiovascular: Normal rate, regular rhythm and normal heart sounds.   Musculoskeletal:       Cervical back: She exhibits decreased range of motion (tenderness with lateral rotation from midline. She is  able to look down without pain. Neck extension causes tenderness at 45 degrees) and tenderness. She exhibits no bony tenderness and no deformity.  Neurological: She is alert and oriented to person, place, and time. GCS score is 15.  Skin: Skin is warm and dry.  Psychiatric: Mood, memory, affect and judgment normal.  Vitals reviewed.   Assessment and Plan :  1. Muscle spasms of neck 2. Neck pain - cyclobenzaprine (FLEXERIL) 10 MG tablet; Take 1 tablet (10 mg total) by mouth 3 (three) times daily as needed for muscle spasms.  Dispense: 45 tablet; Refill: 0 - traMADol (ULTRAM) 50 MG tablet; Take 1 tablet (50 mg total) by  mouth every 6 (six) hours as needed for moderate pain.  Dispense: 12 tablet; Refill: 0 - Discussed and demonstrated for pt stretches and massage techniques. Advised heat, massage, hydration, proper sleeping position. RTC in 2-3 weeks if no improvement. Consider imaging, physical therapy. She agrees with plan  Mercer Pod, PA-C  [facility] 08/09/2017 4:13 PM

## 2017-08-09 NOTE — Patient Instructions (Addendum)
   Be sure to stretch your neck muscles 2-3 times daily. Apply moist heat to the area 2-3 times a day. Ibuprofen and/or Tylenol for pain. Keep the area elevated above the level of your heart. Do this 2-3 times a day until swelling improves.  Massage the area with a tennis ball or get massages.  Stay well hydrated - drink 2 liters of water daily.   Consider acupuncture in the future if you are not improving: [facility] or [facility].   Flexeril is a muscle relaxer.  Tramadol is a pain medication. Do not take these at the same time.   Come back if you are not better in 1-2 weeks.   Thank you for coming in today. I hope you feel we met your needs.  Feel free to call PCP if you have any questions or further requests.  Please consider signing up for MyChart if you do not already have it, as this is a great way to communicate with me.  Best,  [employer], PA-C

## 2017-09-03 ENCOUNTER — Encounter: Payer: Self-pay | Admitting: Radiation Oncology

## 2017-09-03 ENCOUNTER — Encounter: Payer: Self-pay | Admitting: Hematology

## 2017-09-03 ENCOUNTER — Ambulatory Visit
Admission: RE | Admit: 2017-09-03 | Discharge: 2017-09-03 | Disposition: A | Discharge status: Home / Self Care | Payer: BC Managed Care – PPO | Source: Ambulatory Visit | Attending: Radiation Oncology | Admitting: Radiation Oncology

## 2017-09-03 VITALS — BP 127/59 | HR 85 | Temp 98.7°F | Ht 66.0 in | Wt 176.8 lb

## 2017-09-03 DIAGNOSIS — C50212 Malignant neoplasm of upper-inner quadrant of left female breast: Secondary | ICD-10-CM

## 2017-09-03 DIAGNOSIS — Z170 Estrogen receptor positive status [ER+]: Secondary | ICD-10-CM

## 2017-09-03 DIAGNOSIS — Z923 Personal history of irradiation: Secondary | ICD-10-CM | POA: Insufficient documentation

## 2017-09-03 DIAGNOSIS — Z853 Personal history of malignant neoplasm of breast: Secondary | ICD-10-CM | POA: Diagnosis not present

## 2017-09-03 DIAGNOSIS — C50812 Malignant neoplasm of overlapping sites of left female breast: Secondary | ICD-10-CM | POA: Diagnosis present

## 2017-09-03 DIAGNOSIS — Z08 Encounter for follow-up examination after completed treatment for malignant neoplasm: Secondary | ICD-10-CM | POA: Diagnosis not present

## 2017-09-03 NOTE — Progress Notes (Signed)
Kristina Sanders presents for follow up of radiation completed 07/23/17 to her Left Breast. She reports pain to her scar from the removal of her lymph nodes. She has fatigue. She has healed well from her radiation. She is using vitamin E cream as needed. She has an appointment with survivorship on 10/25/17. She has an appointment with Dr. Marlou Starks coming soon.   BP (!) 127/59   Pulse 85   Temp 98.7 F (37.1 C)   Ht 5\' 6"  (1.676 m)   Wt 176 lb 12.8 oz (80.2 kg)   LMP 07/17/2013 Comment: had slight spotting post insertion, IUD removed in 03/2017, blood  work confirmed- menopause  SpO2 98% Comment: room air  BMI 28.54 kg/m    Wt Readings from Last 3 Encounters:  09/03/17 176 lb 12.8 oz (80.2 kg)  08/09/17 173 lb 9.6 oz (78.7 kg)  07/23/17 174 lb 1.6 oz (79 kg)

## 2017-09-03 NOTE — Progress Notes (Signed)
Radiation Oncology         [phone] ________________________________  Name: Kristina Sanders MRN: [id number]  Date: 09/03/2017  DOB: Aug 16, 1961  Follow-Up Visit Note  Outpatient  CC: Kristina Mings, MD  Diagnosis and Prior Radiotherapy: No diagnosis found.  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 She reports that she feels as if she is healing well from the effects of radiation. She continues to use Vitamin E lotion for the effects of her radiation-related skin changes. She does reports some intermittent sharp pain around the site of her incision and she notes that a small nevi over the area of her treatment field seems to be healing slower than normal.   She does voice questions about her current antiestrogen therapy. She states that she was placed on Anagrelide and was unsure that this was the correct medication. She started on this medication 08/16/17. She had only been taking this once daily instead of the prescribed BID.           ALLERGIES:  is allergic to no known allergies.  Meds: Current Outpatient Prescriptions  Medication Sig Dispense Refill  . acetaminophen (TYLENOL) 500 MG tablet Take 1,000 mg by mouth every 6 (six) hours as needed for mild pain.    Marland Kitchen amLODipine (NORVASC) 5 MG tablet TAKE 1 TABLET(5 MG) BY MOUTH DAILY 90 tablet 1  . anagrelide (AGRYLIN) 1 MG capsule Take 1 capsule (1 mg total) by mouth 2 (two) times daily. (Patient taking differently: Take 1 mg by mouth 2 (two) times daily. Pt is taking this once daily.) 30 capsule 2  . famotidine (PEPCID) 20 MG tablet Take 20 mg by mouth daily as needed for heartburn or indigestion.    . fexofenadine (ALLEGRA) 180 MG tablet Take 180 mg by  mouth as needed.     . fluticasone (FLONASE) 50 MCG/ACT nasal spray Place 2 sprays into both nostrils daily. (Patient taking differently: Place 2 sprays into both nostrils daily as needed for allergies. ) 16 g 6  . ibuprofen (ADVIL,MOTRIN) 200 MG tablet Take 400-600 mg by mouth every 6 (six) hours as needed for moderate pain (depends on pain if takes 2-3 tablets).     Marland Kitchen ibuprofen (ADVIL,MOTRIN) 200 MG tablet Take 200 mg by mouth every 6 (six) hours as needed.    Marland Kitchen losartan (COZAAR) 100 MG tablet TAKE 1 TABLET(100 MG) BY MOUTH DAILY 90 tablet 1  . cyclobenzaprine (FLEXERIL) 10 MG tablet Take 1 tablet (10 mg total) by mouth 3 (three) times daily as needed for muscle spasms. (Patient not taking: Reported on 09/03/2017) 45 tablet 0  . EPIPEN 2-PAK 0.3 MG/0.3ML SOAJ injection Inject 0.3 mg into the skin once.   1  . nitrofurantoin, macrocrystal-monohydrate, (MACROBID) 100 MG capsule Take as directed (Patient not taking: Reported on 09/03/2017) 30 capsule 3  . traMADol (ULTRAM) 50 MG tablet Take 1 tablet (50 mg total) by mouth every 6 (six) hours as needed for moderate pain. (Patient not taking: Reported on 09/03/2017) 12 tablet 0  . valACYclovir (VALTREX) 500 MG tablet Take 500 mg by mouth 2 (two) times daily as needed (cold sores).      No current facility-administered medications for this encounter.     Physical Findings: The patient  is in no acute distress. Patient is alert and oriented.  height is 5' 6"  (1.676 m) and weight is 176 lb 12.8 oz (80.2 kg). Her temperature is 98.7 F (37.1 C). Her blood pressure is 127/59 (abnormal) and her pulse is 85. Her oxygen saturation is 98%. .    Breast: Her skin has healed well over the left breast with very minimal pigment changes remaining.   Lab Findings: Lab Results  Component Value Date   WBC 10.7 (H) 05/12/2017   HGB 12.1 05/12/2017   HCT 36.5 05/12/2017   MCV 95.8 05/12/2017   PLT 330 05/12/2017    Radiographic Findings: No results  found.  Impression/Plan:  This is a wonderful 56yo female who presents today for follow up of her left breast cancer. She has now finished radiation therapy and tolerated this very well. She was due to begin Anastrozole daily with Dr Burr Medico per records; however, she received a Rx for Anagrelide.  I advised her to discontinue Anagrelide and Kristina Sanders is seeking guidance from med/onc over the phone for the patient.  I encouraged her to continue with yearly mammography and followup with medical oncology. I will see her back on an as-needed basis. I have encouraged her to call if she has any issues or concerns in the future. I wished her the very best.  I spent 20 minutes face to face with the patient and more than 50% of that time was spent in counseling and/or coordination of care. _____________________________________   Eppie Gibson, MD  This document serves as a record of services personally performed by Eppie Gibson, MD. It was created on her behalf by Reola Mosher, a trained medical scribe. The creation of this record is based on the scribe's personal observations and the provider's statements to them. This document has been checked and approved by the attending provider.

## 2017-09-07 ENCOUNTER — Other Ambulatory Visit: Payer: Self-pay | Admitting: Hematology

## 2017-09-07 ENCOUNTER — Telehealth: Payer: Self-pay | Admitting: *Deleted

## 2017-09-07 MED ORDER — ANASTROZOLE 1 MG PO TABS: 1.0000 mg | ORAL_TABLET | Freq: Every day | ORAL | 2 refills | Status: DC

## 2017-09-07 NOTE — Telephone Encounter (Signed)
Patient called and left a voice mail message stating, "the wrong medication was prescribed for me. I should be taking an antiestrogen pill for my breast cancer. I was prescribed a blood thinner instead. Can someone from Dr. Ernestina Penna office please call me today and clarify this. My number is [phone].

## 2017-09-07 NOTE — Telephone Encounter (Signed)
Dr. Burr Medico asked this nurse to call the patient regarding the antiestrogen medication. Dr. Burr Medico has discontinued Anagralide from her medication list. Dr. Burr Medico has E-scribed Arimidex to [facility]. Patient stated,"thank you for calling me back today. I was upset that I was taking a blood thinner instead of my antiestrogen pill." I asked her,"are you having any bruising or any active bleeding? Patient stated,"no bruising or bleeding." Instructed patient that she could pick up prescription today, and start it today. Instructed patient to throw away the Anagralide pills. Please call the office if she has any questions regarding Arimidex. Patient verbalized understanding.

## 2017-10-25 ENCOUNTER — Encounter: Payer: BC Managed Care – PPO | Admitting: Adult Health

## 2017-11-03 ENCOUNTER — Telehealth: Payer: Self-pay

## 2017-11-03 NOTE — Telephone Encounter (Signed)
Left message for pt reminding of SCP appt on 11/12/17 at 10 am.  Left center phone number on vm if she needs to call us.

## 2017-11-07 ENCOUNTER — Other Ambulatory Visit: Payer: Self-pay | Admitting: Hematology

## 2017-11-12 ENCOUNTER — Other Ambulatory Visit: Payer: Self-pay | Admitting: *Deleted

## 2017-11-12 ENCOUNTER — Ambulatory Visit (HOSPITAL_BASED_OUTPATIENT_CLINIC_OR_DEPARTMENT_OTHER): Payer: BC Managed Care – PPO | Admitting: Adult Health

## 2017-11-12 ENCOUNTER — Telehealth: Payer: Self-pay | Admitting: Adult Health

## 2017-11-12 ENCOUNTER — Encounter: Payer: Self-pay | Admitting: Adult Health

## 2017-11-12 VITALS — BP 167/78 | HR 94 | Temp 98.2°F | Resp 18 | Ht 66.0 in | Wt 180.9 lb

## 2017-11-12 DIAGNOSIS — E2839 Other primary ovarian failure: Secondary | ICD-10-CM

## 2017-11-12 DIAGNOSIS — Z923 Personal history of irradiation: Secondary | ICD-10-CM | POA: Diagnosis not present

## 2017-11-12 DIAGNOSIS — C50212 Malignant neoplasm of upper-inner quadrant of left female breast: Secondary | ICD-10-CM | POA: Diagnosis not present

## 2017-11-12 DIAGNOSIS — Z79811 Long term (current) use of aromatase inhibitors: Secondary | ICD-10-CM | POA: Diagnosis not present

## 2017-11-12 DIAGNOSIS — Z170 Estrogen receptor positive status [ER+]: Secondary | ICD-10-CM

## 2017-11-12 MED ORDER — ANASTROZOLE 1 MG PO TABS: 1.0000 mg | ORAL_TABLET | Freq: Every day | ORAL | 4 refills | Status: DC

## 2017-11-12 NOTE — Telephone Encounter (Signed)
Gave patient AVS and calendar of upcoming March 2019 appointments.  °

## 2017-11-12 NOTE — Progress Notes (Signed)
CLINIC:  Survivorship   REASON FOR VISIT:  Routine follow-up post-treatment for a recent history of breast cancer.  BRIEF ONCOLOGIC HISTORY:  Oncology History   Cancer Staging Malignant neoplasm of upper-inner quadrant of left breast in female, estrogen receptor positive ([place]) Staging form: Breast, AJCC 8th Edition - Clinical stage from 04/19/2017: Stage IA (cT1b, cN0, cM0, G2, ER: Positive, PR: Positive, HER2: Negative) - Signed by Truitt Merle, MD on 04/28/2017 - Pathologic stage from 05/17/2017: Stage IA (pT1c, pN0, cM0, G2, ER: Positive, PR: Positive, HER2: Negative) - Signed by Truitt Merle, MD on 07/24/2017       Malignant neoplasm of upper-inner quadrant of left breast in female, estrogen receptor positive ([place])   04/15/2017 Mammogram    Diagnostic mammogram and ultrasound of left breast:  The new 1.2 cm oval solid mass in the left breast 11:00 position, posterior depth, is highly suspicious for malignancy. The grouped mucus of calcium calcifications in the left breast at the 1:00 and 10:00 middle depth are benign. Ultrasound of the left axilla was negative for adenopathy.      04/19/2017 Receptors her2    Estrogen Receptor: 100%, POSITIVE, STRONG STAINING INTENSITY Progesterone Receptor: 100%, POSITIVE, STRONG STAINING INTENSITY Proliferation Marker Ki67: 30% HER2 (-)      04/19/2017 Initial Biopsy    Breast, left, needle core biopsy INVASIVE DUCTAL CARCINOMA, GRADE 1 DUCTAL CARCINOMA IN SITU, GRADE 1      04/22/2017 Initial Diagnosis    Malignant neoplasm of upper-inner quadrant of left breast in female, estrogen receptor positive ([place])      05/17/2017 Surgery    LEFT BREAST LUMPECTOMY WITH RADIOACTIVE SEED AND SENTINEL LYMPH NODE BIOPSY by Dr. Marlou Starks      05/17/2017 Pathology Results    Diagnosis 05/17/17 1. Breast, lumpectomy, Left INVASIVE DUCTAL CARCINOMA, GRADE 2, SPANNIGN 1.4 CM DUCTAL CARCINOMA IN SITU IS PRESENT ALL MARGINS OF RESECTION ARE NEGATIVE FOR CARCINOMA CARCINOMA  IN SITU IS FOCALLY 1.5 MM FROM THE ANTERIOR INKED MARGIN (FINAL STATUS REF PART 4) 2. Lymph node, sentinel, biopsy, Left axillary ONE LYMPH NODE, NEGATIVE FOR CARCINOMA (0/1) 3. Lymph node, sentinel, biopsy, Left axillary ONE LYMPH NODE, NEGATIVE FOR CARCINOMA (0/1) 4. Breast, excision, Left new anterior margin SMALL FOCUS OF DUCTAL CARCINOMA IN SITU THE CARCINOMA IS 1 MM FROM THE ANTERIOR INKED MARGIN 5. Breast, excision, Left new medial margin BENIGN BREAST TISSUE NEGATIVE FOR CARCINOMA 6. Breast, excision, Left new superior margin FIBROCYSTIC CHANGES WITH CALCIFICATIONS NEGATIVE FOR CARCINOMA 7. Breast, excision, Left new lateral margin FIBROCYSTIC CHANGES WITH CALCIFICATIONS USUAL DUCTAL HYPERPLASIA NEGATIVE FOR CARCINOMA        05/17/2017 Miscellaneous    Oncotype   Recurrence score: 0 Low Risk 10-year risk of recurrence of 3% with Tamoxifen alone      06/24/2017 - 07/23/2017 Radiation Therapy    Radiation with Dr. Isidore Moos      07/2017 -  Anti-estrogen oral therapy    Anastrozole 1 mg daily         INTERVAL HISTORY:  Kristina Sanders presents to the [facility] today for our initial meeting to review her survivorship care plan detailing her treatment course for breast cancer, as well as monitoring long-term side effects of that treatment, education regarding health maintenance, screening, and overall wellness and health promotion.     Overall, Kristina Sanders reports feeling quite well.  She is taking Anastrozole daily.  She is tolerating this well.  She does have some mild hot flashes, that have been present  since prior to diagnosis.  She says these are not worse and she is managing these well.  She denies any other issues.      REVIEW OF SYSTEMS:  Review of Systems  Constitutional: Negative for appetite change, chills, fatigue and fever.  HENT:   Negative for hearing loss, lump/mass and trouble swallowing.   Eyes: Negative for eye problems and icterus.  Respiratory:  Negative for chest tightness, cough and shortness of breath.   Cardiovascular: Negative for chest pain, leg swelling and palpitations.  Gastrointestinal: Negative for abdominal distention and abdominal pain.  Endocrine: Negative for hot flashes.  Genitourinary: Negative for difficulty urinating.   Musculoskeletal: Negative for arthralgias.  Skin: Negative for itching and rash.  Neurological: Negative for dizziness, extremity weakness and numbness.  Hematological: Negative for adenopathy. Does not bruise/bleed easily.  Psychiatric/Behavioral: The patient is not nervous/anxious.   Breast: Denies any new nodularity, masses, tenderness, nipple changes, or nipple discharge.      ONCOLOGY TREATMENT TEAM:  1. Surgeon:  Dr. Marlou Starks at [facility] 2. Medical Oncologist: Dr. Burr Medico  3. Radiation Oncologist: Dr. Isidore Moos    PAST MEDICAL/SURGICAL HISTORY:  Past Medical History:  Diagnosis Date  . Allergy   . Anemia    hx  . GERD (gastroesophageal reflux disease)   . History of abnormal cervical Pap smear   . HSV infection    h/o  . HTN (hypertension)   . Malignant neoplasm of upper-inner quadrant of left breast in female, estrogen receptor positive ([place]) 04/22/2017  . Ovarian cyst    h/o  . Perennial allergic rhinitis    [place]   . Seasonal allergic rhinitis    [place]   Past Surgical History:  Procedure Laterality Date  . ADENOIDECTOMY     only adenoids still have tonsils  . BREAST LUMPECTOMY WITH RADIOACTIVE SEED AND SENTINEL LYMPH NODE BIOPSY Left 05/17/2017   Procedure: LEFT BREAST LUMPECTOMY WITH RADIOACTIVE SEED AND SENTINEL LYMPH NODE BIOPSY;  Surgeon: Jovita Kussmaul, MD;  Location: [facility];  Service: General;  Laterality: Left;  . CESAREAN SECTION     x3  . excision of precancerous mole     lft arm, chest     ALLERGIES:  Allergies  Allergen Reactions  . No Known Allergies      CURRENT MEDICATIONS:  Outpatient Encounter Medications as of 11/12/2017    Medication Sig Note  . acetaminophen (TYLENOL) 500 MG tablet Take 1,000 mg by mouth every 6 (six) hours as needed for mild pain.   Marland Kitchen amLODipine (NORVASC) 5 MG tablet TAKE 1 TABLET(5 MG) BY MOUTH DAILY   . anastrozole (ARIMIDEX) 1 MG tablet Take 1 tablet (1 mg total) by mouth daily.   Marland Kitchen EPIPEN 2-PAK 0.3 MG/0.3ML SOAJ injection Inject 0.3 mg into the skin once.  05/07/2017: Has never had to use  . fexofenadine (ALLEGRA) 180 MG tablet Take 180 mg by mouth as needed.  08/09/2017: prn  . fluticasone (FLONASE) 50 MCG/ACT nasal spray Place 2 sprays into both nostrils daily. (Patient taking differently: Place 2 sprays into both nostrils daily as needed for allergies. )   . ibuprofen (ADVIL,MOTRIN) 200 MG tablet Take 400-600 mg by mouth every 6 (six) hours as needed for moderate pain (depends on pain if takes 2-3 tablets).    Marland Kitchen ibuprofen (ADVIL,MOTRIN) 200 MG tablet Take 200 mg by mouth every 6 (six) hours as needed. 08/09/2017: Per pt taking every 6 hours prn neck  . losartan (COZAAR) 100 MG tablet TAKE 1  TABLET(100 MG) BY MOUTH DAILY   . famotidine (PEPCID) 20 MG tablet Take 20 mg by mouth daily as needed for heartburn or indigestion.   . nitrofurantoin, macrocrystal-monohydrate, (MACROBID) 100 MG capsule Take as directed (Patient not taking: Reported on 09/03/2017) 05/07/2017: Has not had to have in months  . valACYclovir (VALTREX) 500 MG tablet Take 500 mg by mouth 2 (two) times daily as needed (cold sores).  05/07/2017: Has not had in months  . [DISCONTINUED] cyclobenzaprine (FLEXERIL) 10 MG tablet Take 1 tablet (10 mg total) by mouth 3 (three) times daily as needed for muscle spasms. (Patient not taking: Reported on 09/03/2017)   . [DISCONTINUED] traMADol (ULTRAM) 50 MG tablet Take 1 tablet (50 mg total) by mouth every 6 (six) hours as needed for moderate pain. (Patient not taking: Reported on 09/03/2017)    No facility-administered encounter medications on file as of 11/12/2017.      ONCOLOGIC FAMILY  HISTORY:  Family History  Problem Relation Age of Onset  . Mitral valve prolapse Mother   . Prostate cancer Father   . Transient ischemic attack Father   . Breast cancer Paternal Aunt 31       bile duct cancer  . Colon cancer Neg Hx   . Rectal cancer Neg Hx   . Stomach cancer Neg Hx   . Esophageal cancer Neg Hx      GENETIC COUNSELING/TESTING: Not at this time  SOCIAL HISTORY:  SHAUNTEL PREST is married and lives in [place], [place] with her husband.  She is currently working as a Pharmacist, hospital.  She denies any current or history of tobacco, alcohol, or illicit drug use.     PHYSICAL EXAMINATION:  Vital Signs:   Vitals:   11/12/17 1008  BP: (!) 167/78  Pulse: 94  Resp: 18  Temp: 98.2 F (36.8 C)  SpO2: 100%   Filed Weights   11/12/17 1008  Weight: 180 lb 14.4 oz (82.1 kg)   General: Well-nourished, well-appearing female in no acute distress.  She is unaccompanied today.   HEENT: Head is normocephalic.  Pupils equal and reactive to light. Conjunctivae clear without exudate.  Sclerae anicteric. Oral mucosa is pink, moist.  Oropharynx is pink without lesions or erythema.  Lymph: No cervical, supraclavicular, or infraclavicular lymphadenopathy noted on palpation.  Cardiovascular: Regular rate and rhythm.Marland Kitchen Respiratory: Clear to auscultation bilaterally. Chest expansion symmetric; breathing non-labored.  GI: Abdomen soft and round; non-tender, non-distended. Bowel sounds normoactive.  GU: Deferred.  Neuro: No focal deficits. Steady gait.  Psych: Mood and affect normal and appropriate for situation.  Extremities: No edema. MSK: No focal spinal tenderness to palpation.  Full range of motion in bilateral upper extremities Skin: Warm and dry.  LABORATORY DATA:  None for this visit.  DIAGNOSTIC IMAGING:  None for this visit.      ASSESSMENT AND PLAN:  Kristina Sanders is a pleasant 56 y.o. female with Stage IA left breast invasive ductal carcinoma, ER+/PR+/HER2-,  diagnosed in 04/2017, treated with lumpectomy, adjuvant radiation therapy, and anti-estrogen therapy with Anastrozole beginning in 07/2017.  She presents to the Survivorship Clinic for our initial meeting and routine follow-up post-completion of treatment for breast cancer.    1. Stage IA left breast cancer:  Kristina Sanders is continuing to recover from definitive treatment for breast cancer. She will follow-up with her medical oncologist, Dr. Burr Medico in three months.  She will continue her anti-estrogen therapy with Anastrozole. Thus far, she is tolerating the Anastrozole well, with minimal side  effects (see #2).  Today, a comprehensive survivorship care plan and treatment summary was reviewed with the patient today detailing her breast cancer diagnosis, treatment course, potential late/long-term effects of treatment, appropriate follow-up care with recommendations for the future, and patient education resources.  A copy of this summary, along with a letter will be sent to the patient's primary care provider via mail/fax/In Basket message after today's visit.    2. Hot Flashes: This are very brief and she is managing them well. I did emphasize in her SCP the section on handouts and recommended she look into it.    3. Bone health:  Given Kristina Sanders's age/history of breast cancer and her current treatment regimen including anti-estrogen therapy with Anastrozole, she is at risk for bone demineralization. She has not undergone a DEXA scan, and I ordered this for her today to be done at [facility].  In the meantime, she was encouraged to increase her consumption of foods rich in calcium, as well as increase her weight-bearing activities.  She was given education on specific activities to promote bone health.  4. Cancer screening:  Due to Kristina Sanders's history and her age, she should receive screening for skin cancers, colon cancer, and gynecologic cancers.  We reviewed that she will receive annual mammograms.  I reviewed  self breast awareness with her.   The information and recommendations are listed on the patient's comprehensive care plan/treatment summary and were reviewed in detail with the patient.    5. Health maintenance and wellness promotion: Kristina Sanders was encouraged to consume 5-7 servings of fruits and vegetables per day. We reviewed the "Nutrition Rainbow" handout, as well as the handout "Take Control of Your Health and Reduce Your Cancer Risk" from the [facility].  She was also encouraged to engage in moderate to vigorous exercise for 30 minutes per day most days of the week. We discussed the [employer] fitness program, which is designed for cancer survivors to help them become more physically fit after cancer treatments.  She was instructed to limit her alcohol consumption and continue to abstain from tobacco use.     6. Support services/counseling: It is not uncommon for this period of the patient's cancer care trajectory to be one of many emotions and stressors.  We discussed an opportunity for her to participate in the next session of [facility] ("Finding Your New Normal") support group series designed for patients after they have completed treatment.   Kristina Sanders was encouraged to take advantage of our many other support services programs, support groups, and/or counseling in coping with her new life as a cancer survivor after completing anti-cancer treatment.  She was offered support today through active listening and expressive supportive counseling.  She was given information regarding our available services and encouraged to contact me with any questions or for help enrolling in any of our support group/programs.    Dispo:   -Return to cancer center in 3 months for follow up with Dr. Burr Medico -Mammogram due in 03/2018 (ordered today) -Bone density due (ordered today) -Follow up with Dr. Marlou Starks in 04/2018 -She is welcome to return back to the Survivorship Clinic at any time; no additional  follow-up needed at this time.  -Consider referral back to survivorship as a long-term survivor for continued surveillance  A total of (30) minutes of face-to-face time was spent with this patient with greater than 50% of that time in counseling and care-coordination.   Gardenia Phlegm, NP Survivorship Program [facility]  [place]   Note: PRIMARY CARE PROVIDER Leonie Douglas, [place] [phone]

## 2017-11-24 LAB — HM DEXA SCAN

## 2017-12-08 ENCOUNTER — Encounter: Payer: Self-pay | Admitting: Physician Assistant

## 2017-12-09 ENCOUNTER — Telehealth: Payer: Self-pay | Admitting: *Deleted

## 2017-12-09 NOTE — Telephone Encounter (Signed)
TCT patient with results of recent bone density scan. Spoke with patient. Informed her of results of scan-some bone loss in right femoral neck. Discussed role of calcium and Vitamin D supplements for added bone health. Pt verbalized understanding and stated that she would add calcium and vitamin d to daily regime.  No further questions or concerns.

## 2017-12-15 ENCOUNTER — Encounter: Payer: Self-pay | Admitting: Physician Assistant

## 2017-12-15 ENCOUNTER — Other Ambulatory Visit: Payer: Self-pay

## 2017-12-15 ENCOUNTER — Ambulatory Visit: Payer: BC Managed Care – PPO | Admitting: Physician Assistant

## 2017-12-15 VITALS — BP 120/64 | HR 89 | Temp 98.9°F | Resp 18 | Ht 66.0 in | Wt 182.6 lb

## 2017-12-15 DIAGNOSIS — J3489 Other specified disorders of nose and nasal sinuses: Secondary | ICD-10-CM | POA: Diagnosis not present

## 2017-12-15 DIAGNOSIS — R05 Cough: Secondary | ICD-10-CM

## 2017-12-15 DIAGNOSIS — J0100 Acute maxillary sinusitis, unspecified: Secondary | ICD-10-CM

## 2017-12-15 DIAGNOSIS — R059 Cough, unspecified: Secondary | ICD-10-CM

## 2017-12-15 MED ORDER — HYDROCODONE-HOMATROPINE 5-1.5 MG/5ML PO SYRP: 5.0000 mL | ORAL_SOLUTION | Freq: Three times a day (TID) | ORAL | 0 refills | Status: DC | PRN

## 2017-12-15 MED ORDER — DOXYCYCLINE HYCLATE 100 MG PO CAPS: 100.0000 mg | ORAL_CAPSULE | Freq: Two times a day (BID) | ORAL | 0 refills | Status: DC

## 2017-12-15 MED ORDER — BENZONATATE 100 MG PO CAPS: 100.0000 mg | ORAL_CAPSULE | Freq: Three times a day (TID) | ORAL | 0 refills | Status: DC | PRN

## 2017-12-15 NOTE — Progress Notes (Signed)
MRN: [id number] DOB: 1961/02/21  Subjective:   Kristina Sanders is a 57 y.o. female presenting for chief complaint of Cough (x2weeks sinus pressure and tightness in chest ) .  Reports 2 week history of terrible cold. Started out has sinus stuff but then settled in chest. Started feeling better but then the past 2 days has felt significantly worse. Having worsening sinus pressure and productive cough. Has some chest tightness if she coughs too much. Denies fever,  ear pain, wheezing, shortness of breath, chest pain, lower leg swelling, chills, nausea, vomiting, abdominal pain and diarrhea. Has tried sudafed, dayquil, leftover hycodan, neti pot, and albuterol inhaler with some relief. Has had sick contact with husband. Has history of seasonal allergies, takes allergra and flonase daily. has history of intermittent asthma and bronchitis. No hx of COPD. Patient has had flu shot this season. Denies smoking. Denies any other aggravating or relieving factors, no other questions or concerns.  [place] has a current medication list which includes the following prescription(s): acetaminophen, amlodipine, anastrozole, famotidine, fexofenadine, fluticasone, ibuprofen, ibuprofen, losartan, nitrofurantoin (macrocrystal-monohydrate), valacyclovir, and epipen 2-pak. Also is allergic to no known allergies.  Kristina Sanders  has a past medical history of Allergy, Anemia, GERD (gastroesophageal reflux disease), History of abnormal cervical Pap smear, HSV infection, HTN (hypertension), Malignant neoplasm of upper-inner quadrant of left breast in female, estrogen receptor positive ([place]) (04/22/2017), Ovarian cyst, Perennial allergic rhinitis, and Seasonal allergic rhinitis. Also  has a past surgical history that includes Cesarean section; excision of precancerous mole; Breast lumpectomy with radioactive seed and sentinel lymph node biopsy (Left, 05/17/2017); and Adenoidectomy.   Objective:   Vitals: BP 120/64   Pulse 89   Temp  98.9 F (37.2 C) (Oral)   Resp 18   Ht 5\' 6"  (1.676 m)   Wt 182 lb 9.6 oz (82.8 kg)   LMP 07/17/2013 Comment: had slight spotting post insertion, IUD removed in 03/2017, blood  work confirmed- menopause  SpO2 98%   BMI 29.47 kg/m   Physical Exam  Constitutional: She is oriented to person, place, and time. She appears well-developed and well-nourished. No distress.  HENT:  Head: Normocephalic and atraumatic.  Right Ear: Tympanic membrane is not erythematous and not bulging. A middle ear effusion is present.  Left Ear: Tympanic membrane is not erythematous and not bulging. A middle ear effusion is present.  Nose: Mucosal edema and rhinorrhea present. Right sinus exhibits maxillary sinus tenderness. Right sinus exhibits no frontal sinus tenderness. Left sinus exhibits maxillary sinus tenderness. Left sinus exhibits no frontal sinus tenderness.  Mouth/Throat: Uvula is midline, oropharynx is clear and moist and mucous membranes are normal. No tonsillar exudate.  Eyes: Conjunctivae are normal.  Neck: Normal range of motion.  Cardiovascular: Normal rate, regular rhythm, normal heart sounds and intact distal pulses.  Pulmonary/Chest: Effort normal and breath sounds normal. She has no wheezes. She has no rhonchi. She has no rales.  Musculoskeletal:       Right lower leg: She exhibits no tenderness and no swelling.       Left lower leg: She exhibits no tenderness and no swelling.  Lymphadenopathy:       Head (right side): No submental, no submandibular, no tonsillar, no preauricular, no posterior auricular and no occipital adenopathy present.       Head (left side): No submental, no submandibular, no tonsillar, no preauricular, no posterior auricular and no occipital adenopathy present.    She has no cervical adenopathy.  Right: No supraclavicular adenopathy present.       Left: No supraclavicular adenopathy present.  Neurological: She is alert and oriented to person, place, and time.    Skin: Skin is warm and dry.  Psychiatric: She has a normal mood and affect.  Vitals reviewed.   No results found for this or any previous visit (from the past 24 hour(s)).  Assessment and Plan :  1. Cough - doxycycline (VIBRAMYCIN) 100 MG capsule; Take 1 capsule (100 mg total) by mouth 2 (two) times daily.  Dispense: 20 capsule; Refill: 0 - benzonatate (TESSALON) 100 MG capsule; Take 1-2 capsules (100-200 mg total) by mouth 3 (three) times daily as needed for cough.  Dispense: 40 capsule; Refill: 0 - HYDROcodone-homatropine (HYCODAN) 5-1.5 MG/5ML syrup; Take 5 mLs by mouth every 8 (eight) hours as needed for cough.  Dispense: 120 mL; Refill: 0 2. Sinus pressure 3. Acute maxillary sinusitis, recurrence not specified Due to duration and worsening symptoms with conservative tx, will treat both symptomatically and for underlying bacterial etiology at this time. Vitals stable. She has tenderness with palpation of maxillary sinuses, lungs CTAB.  Advised to return to clinic if symptoms worsen, do not improve, or as needed.  - doxycycline (VIBRAMYCIN) 100 MG capsule; Take 1 capsule (100 mg total) by mouth 2 (two) times daily.  Dispense: 20 capsule; Refill: 0  Tenna Delaine, PA-C  [facility] 12/15/2017 3:49 PM

## 2017-12-15 NOTE — Patient Instructions (Addendum)
We will treat this with an antibiotic covers both sinus and atypical lung etiologies.  Please take doxycycline as prescribed. - I recommend you rest, drink plenty of fluids, eat light meals including soups.  - You may use cough syrup at night for your cough and Tessalon pearls during the day. Be aware that cough syrup can definitely make you drowsy and sleepy so do not drive or operate any heavy machinery if it is affecting you during the day.  -Return to clinic if symptoms worsen, do not improve in 10-14 days, or as needed   While taking Doxycycline:  -Do not drink milk or take iron supplements, multivitamins, calcium supplements, antacids, laxatives within 2 hours before or after taking doxycycline. -Avoid direct exposure to sunlight or tanning beds. Doxycycline can make you sunburn more easily. Wear protective clothing and use sunscreen (SPF 30 or higher) when you are outdoors. -Antibiotic medicines can cause diarrhea, which may be a sign of a new infection. If you have diarrhea that is watery or bloody, stop taking this medicine and seek medical care.     Sinusitis, Adult Sinusitis is soreness and inflammation of your sinuses. Sinuses are hollow spaces in the bones around your face. They are located:  Around your eyes.  In the middle of your forehead.  Behind your nose.  In your cheekbones.  Your sinuses and nasal passages are lined with a stringy fluid (mucus). Mucus normally drains out of your sinuses. When your nasal tissues get inflamed or swollen, the mucus can get trapped or blocked so air cannot flow through your sinuses. This lets bacteria, viruses, and funguses grow, and that leads to infection. Follow these instructions at home: Medicines  Take, use, or apply over-the-counter and prescription medicines only as told by your doctor. These may include nasal sprays.  If you were prescribed an antibiotic medicine, take it as told by your doctor. Do not stop taking the  antibiotic even if you start to feel better. Hydrate and Humidify  Drink enough water to keep your pee (urine) clear or pale yellow.  Use a cool mist humidifier to keep the humidity level in your home above 50%.  Breathe in steam for 10-15 minutes, 3-4 times a day or as told by your doctor. You can do this in the bathroom while a hot shower is running.  Try not to spend time in cool or dry air. Rest  Rest as much as possible.  Sleep with your head raised (elevated).  Make sure to get enough sleep each night. General instructions  Put a warm, moist washcloth on your face 3-4 times a day or as told by your doctor. This will help with discomfort.  Wash your hands often with soap and water. If there is no soap and water, use hand sanitizer.  Do not smoke. Avoid being around people who are smoking (secondhand smoke).  Keep all follow-up visits as told by your doctor. This is important. Contact a doctor if:  You have a fever.  Your symptoms get worse.  Your symptoms do not get better within 10 days. Get help right away if:  You have a very bad headache.  You cannot stop throwing up (vomiting).  You have pain or swelling around your face or eyes.  You have trouble seeing.  You feel confused.  Your neck is stiff.  You have trouble breathing. This information is not intended to replace advice given to you by your health care provider. Make sure you discuss any  questions you have with your health care provider. Document Released: 04/20/2008 Document Revised: 06/28/2016 Document Reviewed: 08/28/2015 Elsevier Interactive Patient Education  2018 [employer].  Cough, Adult A cough helps to clear your throat and lungs. A cough may last only 2-3 weeks (acute), or it may last longer than 8 weeks (chronic). Many different things can cause a cough. A cough may be a sign of an illness or another medical condition. Follow these instructions at home:  Pay attention to any changes  in your cough.  Take medicines only as told by your doctor. ? If you were prescribed an antibiotic medicine, take it as told by your doctor. Do not stop taking it even if you start to feel better. ? Talk with your doctor before you try using a cough medicine.  Drink enough fluid to keep your pee (urine) clear or pale yellow.  If the air is dry, use a cold steam vaporizer or humidifier in your home.  Stay away from things that make you cough at work or at home.  If your cough is worse at night, try using extra pillows to raise your head up higher while you sleep.  Do not smoke, and try not to be around smoke. If you need help quitting, ask your doctor.  Do not have caffeine.  Do not drink alcohol.  Rest as needed. Contact a doctor if:  You have new problems (symptoms).  You cough up yellow fluid (pus).  Your cough does not get better after 2-3 weeks, or your cough gets worse.  Medicine does not help your cough and you are not sleeping well.  You have pain that gets worse or pain that is not helped with medicine.  You have a fever.  You are losing weight and you do not know why.  You have night sweats. Get help right away if:  You cough up blood.  You have trouble breathing.  Your heartbeat is very fast. This information is not intended to replace advice given to you by your health care provider. Make sure you discuss any questions you have with your health care provider. Document Released: 07/16/2011 Document Revised: 04/09/2016 Document Reviewed: 01/09/2015 Elsevier Interactive Patient Education  2018 [employer].    IF you received an x-ray today, you will receive an invoice from [facility]. Please contact [facility] at [phone] with questions or concerns regarding your invoice.   IF you received labwork today, you will receive an invoice from [facility]. Please contact [facility] at [phone] with questions or concerns regarding your  invoice.   Our billing staff will not be able to assist you with questions regarding bills from these companies.  You will be contacted with the lab results as soon as they are available. The fastest way to get your results is to activate your My Chart account. Instructions are located on the last page of this paperwork. If you have not heard from [place] regarding the results in 2 weeks, please contact this office.

## 2018-01-08 ENCOUNTER — Other Ambulatory Visit: Payer: Self-pay | Admitting: Physician Assistant

## 2018-01-08 DIAGNOSIS — I10 Essential (primary) hypertension: Secondary | ICD-10-CM

## 2018-01-25 NOTE — Progress Notes (Signed)
[place]  [phone] [phone]  Clinic Follow up Note   Patient Care Team: Donzetta Kohut as PCP - General (Physician Assistant) Megan Salon, MD as Consulting Physician (Gynecology) Harold Hedge, Darrick Grinder, MD (Allergy and Immunology) Truitt Merle, MD as Consulting Physician (Hematology) Jovita Kussmaul, MD as Consulting Physician (General Surgery) Eppie Gibson, MD as Attending Physician (Radiation Oncology) Gardenia Phlegm, NP as Nurse Practitioner (Hematology and Oncology)   Date of Service:  01/27/2018  CHIEF COMPLAINTS:  Follow up of a Malignant neoplasm of upper-inner quadrant of left breast in female, estrogen receptor positive.   Oncology History   Cancer Staging Malignant neoplasm of upper-inner quadrant of left breast in female, estrogen receptor positive ([place]) Staging form: Breast, AJCC 8th Edition - Clinical stage from 04/19/2017: Stage IA (cT1b, cN0, cM0, G2, ER: Positive, PR: Positive, HER2: Negative) - Signed by Truitt Merle, MD on 04/28/2017 - Pathologic stage from 05/17/2017: Stage IA (pT1c, pN0, cM0, G2, ER: Positive, PR: Positive, HER2: Negative) - Signed by Truitt Merle, MD on 07/24/2017       Malignant neoplasm of upper-inner quadrant of left breast in female, estrogen receptor positive ([place])   04/15/2017 Mammogram    Diagnostic mammogram and ultrasound of left breast:  The new 1.2 cm oval solid mass in the left breast 11:00 position, posterior depth, is highly suspicious for malignancy. The grouped mucus of calcium calcifications in the left breast at the 1:00 and 10:00 middle depth are benign. Ultrasound of the left axilla was negative for adenopathy.      04/19/2017 Receptors her2    Estrogen Receptor: 100%, POSITIVE, STRONG STAINING INTENSITY Progesterone Receptor: 100%, POSITIVE, STRONG STAINING INTENSITY Proliferation Marker Ki67: 30% HER2 (-)      04/19/2017 Initial Biopsy    Breast, left, needle core  biopsy INVASIVE DUCTAL CARCINOMA, GRADE 1 DUCTAL CARCINOMA IN SITU, GRADE 1      04/22/2017 Initial Diagnosis    Malignant neoplasm of upper-inner quadrant of left breast in female, estrogen receptor positive ([place])      05/17/2017 Surgery    LEFT BREAST LUMPECTOMY WITH RADIOACTIVE SEED AND SENTINEL LYMPH NODE BIOPSY by Dr. Marlou Starks      05/17/2017 Pathology Results    Diagnosis 05/17/17 1. 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 Lymph node, sentinel, biopsy, Left axillary ONE LYMPH NODE, NEGATIVE FOR CARCINOMA (0/1) 3. Lymph node, sentinel, biopsy, Left axillary ONE LYMPH NODE, NEGATIVE FOR CARCINOMA (0/1) 4. Breast, excision, Left new anterior margin SMALL FOCUS OF DUCTAL CARCINOMA IN SITU THE CARCINOMA IS 1 MM FROM THE ANTERIOR INKED MARGIN 5. Breast, excision, Left new medial margin BENIGN BREAST TISSUE NEGATIVE FOR CARCINOMA 6. Breast, excision, Left new superior margin FIBROCYSTIC CHANGES WITH CALCIFICATIONS NEGATIVE FOR CARCINOMA 7. Breast, excision, Left new lateral margin FIBROCYSTIC CHANGES WITH CALCIFICATIONS USUAL DUCTAL HYPERPLASIA NEGATIVE FOR CARCINOMA        05/17/2017 Miscellaneous    Oncotype   Recurrence score: 0 Low Risk 10-year risk of recurrence of 3% with Tamoxifen alone      06/24/2017 - 07/23/2017 Radiation Therapy    Radiation with Dr. Isidore Moos      07/2017 -  Anti-estrogen oral therapy    Anastrozole 1 mg daily  HISTORY OF PRESENTING ILLNESS: 04/28/17 Kristina Sanders 57 y.o. female is here because of recent diagnosis of a Malignant neoplasm of upper-inner quadrant of left breast in female, estrogen receptor positive. She presents to the Multidisciplinary breast clinic today with her husband.  In the past she normally gets a  mammogram screening but missed 2016. She did not feel any lump or had an abnormal mammogram. Her HTN is controlled. She had her 1st child at 17 years old. Due to menorrhagia she was using mirena for 5 years. She had her mirena removed and was diagnosed as postmenopausal.    She reports to purposefully loosing weight because she is pre-diabetic and feels no other change in her body. Sometimes she gets cold sores.     GYN HISTORY  Menarchal: 12 LMP: in over 5 years due to Contraception  Contraceptive: mirena for 5 years  HRT: mirena was removed in 03/2017 G3P3    CURRENT THERAPY: Anastrozole 1 mg daily starting 07/2017   INTERVAL HISTORY:  Kristina Sanders is here for a follow up for her left breast cancer. She was last seen by me 6 months ago. In interim she say NP Wilber Bihari for survivorship Clinic. She started anastrozole in 07/2017 and tolerated well overall. She notes she had hot flashes she had already and denies any change since on medication.    On review of symptoms, pt notes manageable hot flashes and denies joint pain. She notes having left tennis elbow.     MEDICAL HISTORY:  Past Medical History:  Diagnosis Date  . Allergy   . Anemia    hx  . GERD (gastroesophageal reflux disease)   . History of abnormal cervical Pap smear   . HSV infection    h/o  . HTN (hypertension)   . Malignant neoplasm of upper-inner quadrant of left breast in female, estrogen receptor positive ([place]) 04/22/2017  . Ovarian cyst    h/o  . Perennial allergic rhinitis    [place]   . Seasonal allergic rhinitis    [place]    SURGICAL HISTORY: Past Surgical History:  Procedure Laterality Date  . ADENOIDECTOMY     only adenoids still have tonsils  . BREAST LUMPECTOMY WITH RADIOACTIVE SEED AND SENTINEL LYMPH NODE BIOPSY Left 05/17/2017   Procedure: LEFT BREAST LUMPECTOMY WITH RADIOACTIVE SEED AND SENTINEL LYMPH NODE BIOPSY;  Surgeon: Jovita Kussmaul, MD;  Location: [facility];  Service: General;   Laterality: Left;  . CESAREAN SECTION     x3  . excision of precancerous mole     lft arm, chest    SOCIAL HISTORY: Social History   Socioeconomic History  . Marital status: Married    Spouse name: Not on file  . Number of children: Not on file  . Years of education: Not on file  . Highest education level: Not on file  Social Needs  . Financial resource strain: Not on file  . Food insecurity - worry: Not on file  . Food insecurity - inability: Not on file  . Transportation needs - medical: Not on file  . Transportation needs - non-medical: Not on file  Occupational History  . Not on file  Tobacco Use  . Smoking status: Never Smoker  . Smokeless tobacco: Never Used  Substance and Sexual Activity  . Alcohol use: Yes    Alcohol/week: 2.4 - 3.6 oz    Types: 4 - 6 Standard drinks or equivalent per week    Comment: occ  . Drug use:  No  . Sexual activity: Yes    Partners: Male    Birth control/protection: Other-see comments    Comment: vasectomy  Other Topics Concern  . Not on file  Social History Narrative  . Not on file    FAMILY HISTORY: Family History  Problem Relation Age of Onset  . Mitral valve prolapse Mother   . Prostate cancer Father   . Transient ischemic attack Father   . Breast cancer Paternal Aunt 50       bile duct cancer  . Colon cancer Neg Hx   . Rectal cancer Neg Hx   . Stomach cancer Neg Hx   . Esophageal cancer Neg Hx     ALLERGIES:  is allergic to no known allergies.  MEDICATIONS:  Current Outpatient Medications  Medication Sig Dispense Refill  . acetaminophen (TYLENOL) 500 MG tablet Take 1,000 mg by mouth every 6 (six) hours as needed for mild pain.    Marland Kitchen amLODipine (NORVASC) 5 MG tablet TAKE 1 TABLET(5 MG) BY MOUTH DAILY 90 tablet 0  . anastrozole (ARIMIDEX) 1 MG tablet Take 1 tablet (1 mg total) by mouth daily. 90 tablet 4  . famotidine (PEPCID) 20 MG tablet Take 20 mg by mouth daily as needed for heartburn or indigestion.    .  fexofenadine (ALLEGRA) 180 MG tablet Take 180 mg by mouth as needed.     . fluticasone (FLONASE) 50 MCG/ACT nasal spray Place 2 sprays into both nostrils daily. (Patient taking differently: Place 2 sprays into both nostrils daily as needed for allergies. ) 16 g 6  . ibuprofen (ADVIL,MOTRIN) 200 MG tablet Take 200 mg by mouth every 6 (six) hours as needed.    Marland Kitchen losartan (COZAAR) 100 MG tablet TAKE 1 TABLET(100 MG) BY MOUTH DAILY 90 tablet 0  . nitrofurantoin, macrocrystal-monohydrate, (MACROBID) 100 MG capsule Take as directed (Patient not taking: Reported on 01/27/2018) 30 capsule 3  . valACYclovir (VALTREX) 500 MG tablet Take 500 mg by mouth 2 (two) times daily as needed (cold sores).      No current facility-administered medications for this visit.     REVIEW OF SYSTEMS:  Constitutional: Denies fevers, chills or abnormal night sweats (+) Manageable hot flashes Eyes: Denies blurriness of vision, double vision or watery eyes Ears, nose, mouth, throat, and face: Denies mucositis or sore throat Respiratory: Denies cough, dyspnea or wheezes Cardiovascular: Denies palpitation, chest discomfort or lower extremity swelling Gastrointestinal:  Denies nausea, heartburn or change in bowel habits Skin: Denies abnormal skin rashes MSK: (+) Left elbow joint pain Lymphatics: Denies new lymphadenopathy or easy bruising Neurological:Denies numbness, tingling or new weaknesses Behavioral/Psych: Mood is stable, no new changes  All other systems were reviewed with the patient and are negative.  PHYSICAL EXAMINATION: ECOG PERFORMANCE STATUS: 0 - Asymptomatic  Vitals:   01/27/18 1528  BP: 136/78  Pulse: 77  Resp: 18  Temp: 98.4 F (36.9 C)  SpO2: 100%   Filed Weights   01/27/18 1528  Weight: 183 lb 3.2 oz (83.1 kg)    GENERAL:alert, no distress and comfortable SKIN: skin color, texture, turgor are normal, no rashes or significant lesions EYES: normal, conjunctiva are pink and non-injected,  sclera clear OROPHARYNX:no exudate, no erythema and lips, buccal mucosa, and tongue normal  NECK: supple, thyroid normal size, non-tender, without nodularity LYMPH:  no palpable lymphadenopathy in the cervical, axillary or inguinal LUNGS: clear to auscultation and percussion with normal breathing effort HEART: regular rate & rhythm and no murmurs and no lower  extremity edema ABDOMEN:abdomen soft, non-tender and normal bowel sounds Musculoskeletal:no cyanosis of digits and no clubbing  PSYCH: alert & oriented x 3 with fluent speech NEURO: no focal motor/sensory deficits Breasts: Breast inspection showed them to be symmetrical with no nipple discharge. Palpation of the breasts and axilla revealed no obvious mass that I could appreciate. S/p left breast lumpectomy: (+) incision in left axilla and around arora, healed well.(+) mild left breast lymphedema    LABORATORY DATA:  I have reviewed the data as listed CBC Latest Ref Rng & Units 01/27/2018 05/12/2017 04/28/2017  WBC 3.9 - 10.3 K/uL 8.2 10.7(H) 6.3  Hemoglobin 11.6 - 15.9 g/dL 12.2 12.1 12.8  Hematocrit 34.8 - 46.6 % 36.2 36.5 39.1  Platelets 145 - 400 K/uL 209 330 225    CMP Latest Ref Rng & Units 01/27/2018 05/12/2017 04/28/2017  Glucose 70 - 140 mg/dL 100 117(H) 109  BUN 7 - 26 mg/dL 20 9 14.7  Creatinine 0.60 - 1.10 mg/dL 0.82 0.65 0.7  Sodium 136 - 145 mmol/L 137 136 140  Potassium 3.5 - 5.1 mmol/L 4.2 4.1 3.7  Chloride 98 - 109 mmol/L 102 101 -  CO2 22 - 29 mmol/L 26 25 27   Calcium 8.4 - 10.4 mg/dL 10.4 9.7 9.7  Total Protein 6.4 - 8.3 g/dL 7.1 - 7.0  Total Bilirubin 0.2 - 1.2 mg/dL 0.8 - 0.58  Alkaline Phos 40 - 150 U/L 68 - 68  AST 5 - 34 U/L 18 - 21  ALT 0 - 55 U/L 19 - 21   PATHOLOGY  Diagnosis 05/17/17 1. Breast, lumpectomy, Left INVASIVE DUCTAL CARCINOMA, GRADE 2, SPANNIGN 1.4 CM DUCTAL CARCINOMA IN SITU IS PRESENT ALL MARGINS OF RESECTION ARE NEGATIVE FOR CARCINOMA CARCINOMA IN SITU IS FOCALLY 1.5 MM FROM THE  ANTERIOR INKED MARGIN (FINAL STATUS REF PART 4) 2. Lymph node, sentinel, biopsy, Left axillary ONE LYMPH NODE, NEGATIVE FOR CARCINOMA (0/1) 3. Lymph node, sentinel, biopsy, Left axillary ONE LYMPH NODE, NEGATIVE FOR CARCINOMA (0/1) 4. Breast, excision, Left new anterior margin SMALL FOCUS OF DUCTAL CARCINOMA IN SITU THE CARCINOMA IS 1 MM FROM THE ANTERIOR INKED MARGIN 5. Breast, excision, Left new medial margin BENIGN BREAST TISSUE NEGATIVE FOR CARCINOMA 6. Breast, excision, Left new superior margin FIBROCYSTIC CHANGES WITH CALCIFICATIONS NEGATIVE FOR CARCINOMA 7. Breast, excision, Left new lateral margin FIBROCYSTIC CHANGES WITH CALCIFICATIONS USUAL DUCTAL HYPERPLASIA NEGATIVE FOR CARCINOMA Microscopic Comment 1. BREAST, INVASIVE TUMOR Procedure: lumpectomy Laterality: Left Tumor Size: 1.4 cm Histologic Type: ductal carcinoma Grade: Tubular Differentiation: 3 Microscopic Comment(continued) Nuclear Pleomorphism: 2 Mitotic Count: 1 Ductal Carcinoma in Situ (DCIS): Present Extent of Tumor: Skin: Negative Nipple: Negative Skeletal muscle: Negative Margins: Invasive carcinoma, distance from closest margin: 0.4 cm from the anterior margin DCIS, distance from closest margin: 0.1 cm from the anterior margin (part 4) Regional Lymph Nodes: Number of Lymph Nodes Examined: Number of Sentinel Lymph Nodes Examined: 2 Lymph Nodes with Macrometastases: 0 Lymph Nodes with Micrometastases: 0 Lymph Nodes with Isolated Tumor Cells: 0 Breast Prognostic Profile: [id number] Estrogen Receptor: 100% Progesterone Receptor: 100% Her2: Negative Ki-67: 30% Best tumor block for sendout testing: 1A Pathologic Stage Classification (pTNM, AJCC 8th Edition): Primary Tumor (pT): pT1c Regional Lymph Nodes (pN): pN0 Distant Metastases (pM): pMx   Breast, left, needle core biopsy 04/19/17 INVASIVE DUCTAL CARCINOMA, GRADE 1 DUCTAL CARCINOMA IN SITU, GRADE 1 Estrogen Receptor: 100%, POSITIVE, STRONG  STAINING INTENSITY Progesterone Receptor: 100%, POSITIVE, STRONG STAINING INTENSITY Proliferation Marker Ki67: 30% HER2 (-)  ONCOTYPE 05/17/17  RADIOGRAPHIC STUDIES: I have personally reviewed the radiological images as listed and agreed with the findings in the report. No results found.  11/24/2017  DEXA - shows osteopenia with lowest density at Right Femur neck, T-Score of -1.4   Diagnostic mammogram and ultrasound of left breast on 04/15/2017 Impression; The new 1.2 cm oval solid mass in the left breast 11:00 position, posterior depth, is highly suspicious for malignancy. Biopsy is recommended. Ultrasound of the left axilla was negative for adenopathy.  ASSESSMENT & PLAN:  Kristina Sanders is a 56 y.o. caucasian female who has a history of Anemia, HTN, and Ovarian cysts.   1.  Malignant neoplasm of upper-inner quadrant of left breast in female, pT1cN0M0, stage IA, ER+/PR+/HER2-,  Grade 2 -I previously discussed her surgical path result in details -the Oncotype Dx result was reviewed with her in details. She has very low risk based on the recurrence score 0, which predicts 10 year distant recurrence after 5 years of tamoxifen 3%. There is no benefit of chemotherapy in the low risk group. -she had blood test with Dr. Sabra Heck on 02/26/17 had [place] as 55, she is postmenopausal. --Given the strong ER and PR positivity In the postmenopausal status, I do recommend adjuvant aromatase inhibitor to reduce her risk of cancer recurrence,  The potential benefit and side effects were discussed with her in great details. She is interested. -She started anastrozole in 07/2017.  -We previously discussed the breast cancer surveillance. She will continue annual screening mammogram, self exam, and a routine office visit with lab and exam with [place]. -She is clinically doing well. Lab reviewed, her CBC and CMP are within normal limits. Her physical exam showed mild lymphedema  which I suggest she does exercise  and see PT if needed. There is no clinical concern for recurrence. -Next Mammogram in 03/2018 -F/u in 6 months   2. HTN -Continue medications and follow-up of his PCP  3. Osteopenia  -Her 11/24/2017  DEXA shows osteopenia with lowest density at Right Femur neck, T-Score of -1.4. I reviewed results with patient.  -I discussed her anastrozole may reduce her bone density, we will continue to monitor with DEXA scans every 2 years.  -If next DEXA shows marked reduction in density will consider medication to help strengthen her bone.  -Continue calcium and Vitamin D. I encouraged her to remain active and do tolerable weight bearing exercise.   Plan -Continue Anastrozole  -Mammogram in 03/2018  -Lab and f/u in 6 months     No orders of the defined types were placed in this encounter.   All questions were answered. The patient knows to call the clinic with any problems, questions or concerns. I spent 15 minutes counseling the patient face to face. The total time spent in the appointment was 20 minutes and more than 50% was on counseling.  This document serves as a record of services personally performed by Truitt Merle, MD. It was created on her behalf by Joslyn Devon, a trained medical scribe. The creation of this record is based on the scribe's personal observations and the provider's statements to them.    I have reviewed the above documentation for accuracy and completeness, and I agree with the above.     Truitt Merle, MD 01/27/2018

## 2018-01-27 ENCOUNTER — Inpatient Hospital Stay: Payer: BC Managed Care – PPO | Attending: Hematology

## 2018-01-27 ENCOUNTER — Inpatient Hospital Stay (HOSPITAL_BASED_OUTPATIENT_CLINIC_OR_DEPARTMENT_OTHER): Payer: BC Managed Care – PPO | Admitting: Hematology

## 2018-01-27 ENCOUNTER — Encounter: Payer: Self-pay | Admitting: Hematology

## 2018-01-27 VITALS — BP 136/78 | HR 77 | Temp 98.4°F | Resp 18 | Ht 66.0 in | Wt 183.2 lb

## 2018-01-27 DIAGNOSIS — C50212 Malignant neoplasm of upper-inner quadrant of left female breast: Secondary | ICD-10-CM | POA: Diagnosis not present

## 2018-01-27 DIAGNOSIS — Z170 Estrogen receptor positive status [ER+]: Secondary | ICD-10-CM

## 2018-01-27 DIAGNOSIS — I10 Essential (primary) hypertension: Secondary | ICD-10-CM | POA: Diagnosis not present

## 2018-01-27 DIAGNOSIS — M8580 Other specified disorders of bone density and structure, unspecified site: Secondary | ICD-10-CM | POA: Insufficient documentation

## 2018-01-27 LAB — CBC WITH DIFFERENTIAL/PLATELET
BASOS ABS: 0 10*3/uL (ref 0.0–0.1)
Basophils Relative: 0 %
Eosinophils Absolute: 0.2 10*3/uL (ref 0.0–0.5)
Eosinophils Relative: 2 %
HEMATOCRIT: 36.2 % (ref 34.8–46.6)
Hemoglobin: 12.2 g/dL (ref 11.6–15.9)
LYMPHS PCT: 22 %
Lymphs Abs: 1.8 10*3/uL (ref 0.9–3.3)
MCH: 32.4 pg (ref 25.1–34.0)
MCHC: 33.7 g/dL (ref 31.5–36.0)
MCV: 96 fL (ref 79.5–101.0)
MONO ABS: 0.8 10*3/uL (ref 0.1–0.9)
MONOS PCT: 9 %
NEUTROS ABS: 5.4 10*3/uL (ref 1.5–6.5)
Neutrophils Relative %: 67 %
Platelets: 209 10*3/uL (ref 145–400)
RBC: 3.77 MIL/uL (ref 3.70–5.45)
RDW: 12 % (ref 11.2–14.5)
WBC: 8.2 10*3/uL (ref 3.9–10.3)

## 2018-01-27 LAB — COMPREHENSIVE METABOLIC PANEL
ALBUMIN: 4.3 g/dL (ref 3.5–5.0)
ALT: 19 U/L (ref 0–55)
ANION GAP: 9 (ref 3–11)
AST: 18 U/L (ref 5–34)
Alkaline Phosphatase: 68 U/L (ref 40–150)
BUN: 20 mg/dL (ref 7–26)
CO2: 26 mmol/L (ref 22–29)
Calcium: 10.4 mg/dL (ref 8.4–10.4)
Chloride: 102 mmol/L (ref 98–109)
Creatinine, Ser: 0.82 mg/dL (ref 0.60–1.10)
GFR calc non Af Amer: >60 mL/min (ref >60)
GLUCOSE: 100 mg/dL (ref 70–140)
POTASSIUM: 4.2 mmol/L (ref 3.5–5.1)
Sodium: 137 mmol/L (ref 136–145)
TOTAL PROTEIN: 7.1 g/dL (ref 6.4–8.3)
Total Bilirubin: 0.8 mg/dL (ref 0.2–1.2)

## 2018-01-28 LAB — FOLLICLE STIMULATING HORMONE: FSH: 71.9 m[IU]/mL

## 2018-01-28 LAB — ESTRADIOL: Estradiol: <5 pg/mL

## 2018-03-14 ENCOUNTER — Telehealth: Payer: Self-pay

## 2018-03-14 NOTE — Telephone Encounter (Signed)
Patient called stating that at her last visit with Dr. Burr Medico was experiencing some lymphedema and Dr. Burr Medico suggested she see PT for this.  Patient tried to get an appointment with PT on [facility]. However they are telling her they would need a referral from Dr. Burr Medico.

## 2018-03-16 ENCOUNTER — Other Ambulatory Visit: Payer: Self-pay | Admitting: *Deleted

## 2018-03-16 DIAGNOSIS — I890 Lymphedema, not elsewhere classified: Secondary | ICD-10-CM

## 2018-03-16 DIAGNOSIS — Z170 Estrogen receptor positive status [ER+]: Secondary | ICD-10-CM

## 2018-03-16 DIAGNOSIS — C50212 Malignant neoplasm of upper-inner quadrant of left female breast: Secondary | ICD-10-CM

## 2018-03-16 NOTE — Progress Notes (Signed)
Order for PT sent for lymphedema concerns

## 2018-03-16 NOTE — Telephone Encounter (Signed)
Per Dr Ernestina Penna last visit note stating proceed with PT if needed this RN placed order for referral for services.

## 2018-03-30 ENCOUNTER — Other Ambulatory Visit: Payer: Self-pay

## 2018-03-30 ENCOUNTER — Ambulatory Visit: Payer: BC Managed Care – PPO | Attending: Hematology | Admitting: Physical Therapy

## 2018-03-30 DIAGNOSIS — I890 Lymphedema, not elsewhere classified: Secondary | ICD-10-CM | POA: Diagnosis present

## 2018-03-30 DIAGNOSIS — L599 Disorder of the skin and subcutaneous tissue related to radiation, unspecified: Secondary | ICD-10-CM

## 2018-03-30 NOTE — Therapy (Signed)
[place], [place], [address] Phone: [phone]   Fax:  [phone]  Physical Therapy Evaluation  Patient Details  Name: Kristina Sanders MRN: [id number] Date of Birth: [birth year] Referring Provider: Dr. Truitt Merle   Encounter Date: 03/30/2018  PT End of Session - 03/30/18 1706    Visit Number  1    Number of Visits  1    PT Start Time  1524    PT Stop Time  1605    PT Time Calculation (min)  41 min    Activity Tolerance  Patient tolerated treatment well    Behavior During Therapy  [facility] for tasks assessed/performed       Past Medical History:  Diagnosis Date  . Allergy   . Anemia    hx  . GERD (gastroesophageal reflux disease)   . History of abnormal cervical Pap smear   . HSV infection    h/o  . HTN (hypertension)   . Malignant neoplasm of upper-inner quadrant of left breast in female, estrogen receptor positive ([place]) 04/22/2017  . Ovarian cyst    h/o  . Perennial allergic rhinitis    [place]   . Seasonal allergic rhinitis    [place]    Past Surgical History:  Procedure Laterality Date  . ADENOIDECTOMY     only adenoids still have tonsils  . BREAST LUMPECTOMY WITH RADIOACTIVE SEED AND SENTINEL LYMPH NODE BIOPSY Left 05/17/2017   Procedure: LEFT BREAST LUMPECTOMY WITH RADIOACTIVE SEED AND SENTINEL LYMPH NODE BIOPSY;  Surgeon: Jovita Kussmaul, MD;  Location: [place];  Service: General;  Laterality: Left;  . CESAREAN SECTION     x3  . excision of precancerous mole     lft arm, chest    There were no vitals filed for this visit.   Subjective Assessment - 03/30/18 1526    Subjective  My follow-up appointment with Dr. Burr Medico --she said you are having some lymphedema in your breast.  I had not recognized that that was happening; I thought it was just some scar tissue. I have some questions about that.  I have some sporadic minor pain. My mother-in-law had pretty severe lymphedema in her arm. Has  noticed that the nipple is larger on left.    Pertinent History  Left breast cancer diagnosed in May 2018; had partial mastectomy 05/17/17, then started RT x 20 treatments, finishing 07/23/17. Started anastrozole early October.  HTN controlled with meds; dust and mold allergies. Recent flareup of plantar fasciitis.    Patient Stated Goals  to find out if I do have lymphedema and if so if there's something I should be doing to control it    Currently in Pain?  No/denies         [facility] PT Assessment - 03/30/18 0001      Assessment   Medical Diagnosis  left breast cancer    Referring Provider  Dr. Truitt Merle    Onset Date/Surgical Date  05/17/17    Hand Dominance  Right    Prior Therapy  none      Precautions   Precaution Comments  cancer precautions      Restrictions   Weight Bearing Restrictions  No      Balance Screen   Has the patient fallen in the past 6 months  No    Has the patient had a decrease in activity level because of a fear of falling?   No    Is the  patient reluctant to leave their home because of a fear of falling?   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, hospital, 2nd grade    Leisure  tries to get in 8-10,000 steps a day      Cognition   Overall Cognitive Status  Within Functional Limits for tasks assessed      Observation/Other Assessments   Observations  by observation, left breast appears just slightly larger than right, with some superior fullness and induration on left compared to right, and slight fullness at left upper flank; this does not appear to be a very significant amount of swelling      ROM / Strength   AROM / PROM / Strength  AROM      AROM   Overall AROM Comments  both shoulders grossly [facility]       Ambulation/Gait   Ambulation/Gait  Yes    Ambulation/Gait Assistance  7: Independent        LYMPHEDEMA/ONCOLOGY QUESTIONNAIRE - 03/30/18 1537      Type   Cancer Type  left breast      Surgeries   Lumpectomy Date  05/17/17    Number Lymph Nodes Removed  2 negative      Treatment   Past Radiation Treatment  Yes    Date  07/23/17    Body Site  left breast      Lymphedema Assessments   Lymphedema Assessments  Upper extremities      Right Upper Extremity Lymphedema   10 cm Proximal to Olecranon Process  31.3 cm    Olecranon Process  25.5 cm    10 cm Proximal to Ulnar Styloid Process  22.7 cm    Just Proximal to Ulnar Styloid Process  16 cm    Across Hand at [employer]  19 cm    At [place] of 2nd Digit  6 cm      Left Upper Extremity Lymphedema   10 cm Proximal to Olecranon Process  31.2 cm    Olecranon Process  25.9 cm    10 cm Proximal to Ulnar Styloid Process  21.9 cm    Just Proximal to Ulnar Styloid Process  15.2 cm    Across Hand at [employer]  18.8 cm    At [place] of 2nd Digit  5.7 cm             Objective measurements completed on examination: See above findings.              PT Education - 03/30/18 1705    Education provided  Yes    Education Details  about characteristics of compression or sports bras that are desirable    Person(s) Educated  Patient    Methods  Explanation;Handout    Comprehension  Verbalized understanding          PT Long Term Goals - 03/30/18 1713      PT LONG TERM GOAL #1   Title  Pt. will be knowledgeable about features of a compression or sports bra that will help minimize swelling of her breast.    Status  Achieved  Plan - 03/30/18 1706    Clinical Impression Statement  This is a patient s/p lumpectomy and radiation for left breast cancer referred for possible breast lymphedema noticed by her medical oncologist.  The patient herself does not notice problematic swelling. On assessment  today, she appears to have minimal swelling with some induration at upper aspect of breast. UE circumference measurements do not indicate arm swelling. She may benefit from wearing a compression or sports bra, so was educated about features of these garments that would be helpful to look for.  She should not otherwise require follow-up therapy, but knows that we are available to her if needed.    Clinical Decision Making  Low    Rehab Potential  Excellent    PT Frequency  One time visit    PT Treatment/Interventions  Patient/family education    PT Next Visit Plan  No follow-up planned.    PT Home Exercise Plan  Pt. will use compression or sports bras more often and see if this helps.    Consulted and Agree with Plan of Care  Patient       Patient will benefit from skilled therapeutic intervention in order to improve the following deficits and impairments:  Increased edema, Decreased knowledge of use of DME  Visit Diagnosis: Lymphedema, not elsewhere classified - Plan: PT plan of care cert/re-cert  Disorder of the skin and subcutaneous tissue related to radiation, unspecified - Plan: PT plan of care cert/re-cert     Problem List Patient Active Problem List   Diagnosis Date Noted  . Malignant neoplasm of upper-inner quadrant of left breast in female, estrogen receptor positive ([place]) 04/22/2017  . Allergic rhinitis due to other allergen 10/08/2012  . HTN (hypertension)     Tongela Encinas 03/30/2018, 5:15 PM  [place], [place], [address] Phone: [phone]   Fax:  [phone]  Name: Kristina Sanders MRN: [id number] Date of Birth: 11/24/1960  Serafina Royals, PT 03/30/18 5:15 PM

## 2018-04-04 ENCOUNTER — Other Ambulatory Visit: Payer: Self-pay

## 2018-04-04 ENCOUNTER — Encounter: Payer: Self-pay | Admitting: Obstetrics & Gynecology

## 2018-04-04 ENCOUNTER — Ambulatory Visit: Payer: BC Managed Care – PPO | Admitting: Obstetrics & Gynecology

## 2018-04-04 ENCOUNTER — Other Ambulatory Visit (HOSPITAL_COMMUNITY)
Admission: RE | Admit: 2018-04-04 | Discharge: 2018-04-04 | Disposition: A | Discharge status: Home / Self Care | Payer: BC Managed Care – PPO | Source: Ambulatory Visit | Attending: Obstetrics & Gynecology | Admitting: Obstetrics & Gynecology

## 2018-04-04 VITALS — BP 120/62 | HR 84 | Resp 16 | Ht 65.5 in | Wt 185.5 lb

## 2018-04-04 DIAGNOSIS — Z23 Encounter for immunization: Secondary | ICD-10-CM

## 2018-04-04 DIAGNOSIS — Z01419 Encounter for gynecological examination (general) (routine) without abnormal findings: Secondary | ICD-10-CM

## 2018-04-04 DIAGNOSIS — Z124 Encounter for screening for malignant neoplasm of cervix: Secondary | ICD-10-CM

## 2018-04-04 DIAGNOSIS — R87619 Unspecified abnormal cytological findings in specimens from cervix uteri: Secondary | ICD-10-CM

## 2018-04-04 MED ORDER — NITROFURANTOIN MONOHYD MACRO 100 MG PO CAPS: ORAL_CAPSULE | ORAL | 3 refills | Status: DC

## 2018-04-04 NOTE — Progress Notes (Signed)
57 y.o. Kristina Sanders MarriedCaucasianF here for annual exam.  Doing well.  Diagnosed with breast cancer last year--s/p lumpectomy and SNB, Stage IA.  On Anastrozole.    Denies vaginal bleeding.    Patient's last menstrual period was 07/17/2013.          Sexually active: Yes.    The current method of family planning is post menopausal status.    Exercising: Yes.    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 Lumpectomy 05/17/17  Colonoscopy:  02/22/15 normal. F/u 10 years  BMD:   11/24/17 Osteopenia  TDaP:  2007 Pneumonia vaccine(s):  n/a Shingrix:   No.  D/w with Shingrix vaccination.   Hep C testing: 04/14/16 Neg  Screening Labs: PCP   reports that she has never smoked. She has never used smokeless tobacco. She reports that she drinks about 2.4 - 3.6 oz of alcohol per week. She reports that she does not use drugs.  Past Medical History:  Diagnosis Date  . Allergy   . Anemia    hx  . GERD (gastroesophageal reflux disease)   . History of abnormal cervical Pap smear   . HSV infection    h/o  . HTN (hypertension)   . Malignant neoplasm of upper-inner quadrant of left breast in female, estrogen receptor positive ([place]) 04/22/2017  . Ovarian cyst    h/o  . Perennial allergic rhinitis    [place]   . Seasonal allergic rhinitis    [place]    Past Surgical History:  Procedure Laterality Date  . ADENOIDECTOMY     only adenoids still have tonsils  . BREAST LUMPECTOMY WITH RADIOACTIVE SEED AND SENTINEL LYMPH NODE BIOPSY Left 05/17/2017   Procedure: LEFT BREAST LUMPECTOMY WITH RADIOACTIVE SEED AND SENTINEL LYMPH NODE BIOPSY;  Surgeon: Jovita Kussmaul, MD;  Location: [place];  Service: General;  Laterality: Left;  . CESAREAN SECTION     x3  . excision of precancerous mole     lft arm, chest    Current Outpatient Medications  Medication Sig Dispense Refill  . acetaminophen (TYLENOL) 500 MG tablet  Take 1,000 mg by mouth every 6 (six) hours as needed for mild pain.    Marland Kitchen amLODipine (NORVASC) 5 MG tablet TAKE 1 TABLET(5 MG) BY MOUTH DAILY 90 tablet 0  . anastrozole (ARIMIDEX) 1 MG tablet Take 1 tablet (1 mg total) by mouth daily. 90 tablet 4  . famotidine (PEPCID) 20 MG tablet Take 20 mg by mouth daily as needed for heartburn or indigestion.    . fexofenadine (ALLEGRA) 180 MG tablet Take 180 mg by mouth as needed.     . fluticasone (FLONASE) 50 MCG/ACT nasal spray Place 2 sprays into both nostrils daily. (Patient taking differently: Place 2 sprays into both nostrils daily as needed for allergies. ) 16 g 6  . ibuprofen (ADVIL,MOTRIN) 200 MG tablet Take 200 mg by mouth every 6 (six) hours as needed.    Marland Kitchen losartan (COZAAR) 100 MG tablet TAKE 1 TABLET(100 MG) BY MOUTH DAILY 90 tablet 0  . valACYclovir (VALTREX) 500 MG tablet Take 500 mg by mouth 2 (two) times daily as needed (cold sores).     . nitrofurantoin, macrocrystal-monohydrate, (MACROBID) 100 MG capsule Take as directed (Patient not taking: Reported on 04/04/2018) 30 capsule 3   No current facility-administered medications for this visit.     Family History  Problem  Relation Age of Onset  . Mitral valve prolapse Mother   . Prostate cancer Father   . Transient ischemic attack Father   . Breast cancer Paternal Aunt 46       bile duct cancer  . Colon cancer Neg Hx   . Rectal cancer Neg Hx   . Stomach cancer Neg Hx   . Esophageal cancer Neg Hx     Review of Systems  Genitourinary:       Breast pain   All other systems reviewed and are negative.   Exam:   BP 120/62 (BP Location: Right Arm, Patient Position: Sitting, Cuff Size: Large)   Pulse 84   Resp 16   Ht 5' 5.5" (1.664 m)   Wt 185 lb 8 oz (84.1 kg)   LMP 07/17/2013 Comment: had slight spotting post insertion, IUD removed in 03/2017, blood  work confirmed- menopause  BMI 30.40 kg/m     Height: 5' 5.5" (166.4 cm)  Ht Readings from Last 3 Encounters:  04/04/18 5' 5.5"  (1.664 m)  01/27/18 5\' 6"  (1.676 m)  12/15/17 5\' 6"  (1.676 m)    General appearance: alert, cooperative and appears stated age Head: Normocephalic, without obvious abnormality, atraumatic Neck: no adenopathy, supple, symmetrical, trachea midline and thyroid normal to inspection and palpation Lungs: clear to auscultation bilaterally Breasts: normal appearance, no masses or tenderness Heart: regular rate and rhythm Abdomen: soft, non-tender; bowel sounds normal; no masses,  no organomegaly Extremities: extremities normal, atraumatic, no cyanosis or edema Skin: Skin color, texture, turgor normal. No rashes or lesions Lymph nodes: Cervical, supraclavicular, and axillary nodes normal. No abnormal inguinal nodes palpated Neurologic: Grossly normal   Pelvic: External genitalia:  no lesions              Urethra:  normal appearing urethra with no masses, tenderness or lesions              Bartholins and Skenes: normal                 Vagina: normal appearing vagina with normal color and discharge, no lesions              Cervix: no lesions              Pap taken: Yes.   Bimanual Exam:  Uterus:  normal size, contour, position, consistency, mobility, non-tender              Adnexa: normal adnexa and no mass, fullness, tenderness               Rectovaginal: Confirms               Anus:  normal sphincter tone, no lesions  Chaperone was present for exam.  A:  Well Woman with normal exam PMP, no HRT H/O 1A invasive ductal breast ca, s/p lumpectomy.  Now on anastrozole.  Hypertension H/O recurrent UTIs H/O Atypical endometrial cell pap  P:   Mammogram guidelines reviewed.  Will have diagnostic bilateral MMG this summer pap smear only obtained.  Pt desired to have this done. RF for macrobid 100mg  prn.  #30/3RD Tdap will be updated today D/w pt shingrix vaccination. return annually or prn

## 2018-04-05 ENCOUNTER — Other Ambulatory Visit: Payer: Self-pay | Admitting: Physician Assistant

## 2018-04-05 DIAGNOSIS — I10 Essential (primary) hypertension: Secondary | ICD-10-CM

## 2018-04-06 LAB — CYTOLOGY - PAP
DIAGNOSIS: NEGATIVE
HPV (WINDOPATH): NOT DETECTED

## 2018-04-26 ENCOUNTER — Encounter: Payer: Self-pay | Admitting: Obstetrics & Gynecology

## 2018-05-04 ENCOUNTER — Other Ambulatory Visit: Payer: Self-pay | Admitting: Physician Assistant

## 2018-05-04 DIAGNOSIS — I10 Essential (primary) hypertension: Secondary | ICD-10-CM

## 2018-05-30 ENCOUNTER — Other Ambulatory Visit: Payer: Self-pay | Admitting: Physician Assistant

## 2018-05-30 ENCOUNTER — Ambulatory Visit: Payer: BC Managed Care – PPO | Admitting: Physician Assistant

## 2018-05-30 DIAGNOSIS — I10 Essential (primary) hypertension: Secondary | ICD-10-CM

## 2018-06-01 ENCOUNTER — Ambulatory Visit: Payer: BC Managed Care – PPO | Admitting: Physician Assistant

## 2018-06-01 ENCOUNTER — Encounter: Payer: Self-pay | Admitting: Physician Assistant

## 2018-06-01 ENCOUNTER — Other Ambulatory Visit: Payer: Self-pay

## 2018-06-01 VITALS — BP 134/84 | HR 76 | Temp 97.8°F | Resp 20 | Ht 66.81 in | Wt 182.6 lb

## 2018-06-01 DIAGNOSIS — E663 Overweight: Secondary | ICD-10-CM | POA: Diagnosis not present

## 2018-06-01 DIAGNOSIS — I10 Essential (primary) hypertension: Secondary | ICD-10-CM

## 2018-06-01 DIAGNOSIS — R7303 Prediabetes: Secondary | ICD-10-CM

## 2018-06-01 LAB — LIPID PANEL
CHOL/HDL RATIO: 4.3 ratio (ref 0.0–4.4)
Cholesterol, Total: 241 mg/dL — ABNORMAL HIGH (ref 100–199)
HDL: 56 mg/dL (ref >39)
LDL Calculated: 163 mg/dL — ABNORMAL HIGH (ref 0–99)
Triglycerides: 111 mg/dL (ref 0–149)
VLDL Cholesterol Cal: 22 mg/dL (ref 5–40)

## 2018-06-01 LAB — URINALYSIS, DIPSTICK ONLY
Bilirubin, UA: NEGATIVE
Glucose, UA: NEGATIVE
Ketones, UA: NEGATIVE
Nitrite, UA: NEGATIVE
PH UA: 6.5 (ref 5.0–7.5)
PROTEIN UA: NEGATIVE
RBC, UA: NEGATIVE
SPEC GRAV UA: 1.018 (ref 1.005–1.030)
Urobilinogen, Ur: 0.2 mg/dL (ref 0.2–1.0)

## 2018-06-01 LAB — BASIC METABOLIC PANEL
BUN / CREAT RATIO: 25 — AB (ref 9–23)
BUN: 15 mg/dL (ref 6–24)
CALCIUM: 9.7 mg/dL (ref 8.7–10.2)
CO2: 21 mmol/L (ref 20–29)
Chloride: 100 mmol/L (ref 96–106)
Creatinine, Ser: 0.6 mg/dL (ref 0.57–1.00)
GFR, EST AFRICAN AMERICAN: 118 mL/min/{1.73_m2} (ref >59)
GFR, EST NON AFRICAN AMERICAN: 102 mL/min/{1.73_m2} (ref >59)
Glucose: 117 mg/dL — ABNORMAL HIGH (ref 65–99)
POTASSIUM: 4.3 mmol/L (ref 3.5–5.2)
Sodium: 138 mmol/L (ref 134–144)

## 2018-06-01 LAB — HEMOGLOBIN A1C
Est. average glucose Bld gHb Est-mCnc: 108 mg/dL
Hgb A1c MFr Bld: 5.4 % (ref 4.8–5.6)

## 2018-06-01 MED ORDER — LOSARTAN POTASSIUM 100 MG PO TABS: ORAL_TABLET | ORAL | 1 refills | Status: DC

## 2018-06-01 MED ORDER — AMLODIPINE BESYLATE 5 MG PO TABS
ORAL_TABLET | ORAL | 1 refills | Status: DC
Start: 2018-06-01 — End: 2018-11-22

## 2018-06-01 NOTE — Patient Instructions (Addendum)
Keep up the good work! Apps that can help count carbs are carb manager and my fitness pal. In terms of elevated blood pressure, I would like you to check your blood pressure at least a couple times each week outside of the office and document these values. It is best if you check the blood pressure at different times in the day. Your goal is around 130/80. If your values are consistently above this goal, please return to office for further evaluation. Otherwise, follow up in 6 months. If you start to have chest pain, blurred vision, shortness of breath, severe headache, lower leg swelling, or nausea/vomiting please seek care immediately here or at the ED.     Managing Your Hypertension Hypertension is commonly called high blood pressure. This is when the force of your blood pressing against the walls of your arteries is too strong. Arteries are blood vessels that carry blood from your heart throughout your body. Hypertension forces the heart to work harder to pump blood, and may cause the arteries to become narrow or stiff. Having untreated or uncontrolled hypertension can cause heart attack, stroke, kidney disease, and other problems. What are blood pressure readings? A blood pressure reading consists of a higher number over a lower number. Ideally, your blood pressure should be below 120/80. The first ("top") number is called the systolic pressure. It is a measure of the pressure in your arteries as your heart beats. The second ("bottom") number is called the diastolic pressure. It is a measure of the pressure in your arteries as the heart relaxes. What does my blood pressure reading mean? Blood pressure is classified into four stages. Based on your blood pressure reading, your health care provider may use the following stages to determine what type of treatment you need, if any. Systolic pressure and diastolic pressure are measured in a unit called mm Hg. Normal  Systolic pressure: below  120.  Diastolic pressure: below 80. Elevated  Systolic pressure: 696-789.  Diastolic pressure: below 80. Hypertension stage 1  Systolic pressure: 381-017.  Diastolic pressure: 51-02. Hypertension stage 2  Systolic pressure: 585 or above.  Diastolic pressure: 90 or above. What health risks are associated with hypertension? Managing your hypertension is an important responsibility. Uncontrolled hypertension can lead to:  A heart attack.  A stroke.  A weakened blood vessel (aneurysm).  Heart failure.  Kidney damage.  Eye damage.  Metabolic syndrome.  Memory and concentration problems.  What changes can I make to manage my hypertension? Hypertension can be managed by making lifestyle changes and possibly by taking medicines. Your health care provider will help you make a plan to bring your blood pressure within a normal range. Eating and drinking  Eat a diet that is high in fiber and potassium, and low in salt (sodium), added sugar, and fat. An example eating plan is called the DASH (Dietary Approaches to Stop Hypertension) diet. To eat this way: ? Eat plenty of fresh fruits and vegetables. Try to fill half of your plate at each meal with fruits and vegetables. ? Eat whole grains, such as whole wheat pasta, brown rice, or whole grain bread. Fill about one quarter of your plate with whole grains. ? Eat low-fat diary products. ? Avoid fatty cuts of meat, processed or cured meats, and poultry with skin. Fill about one quarter of your plate with lean proteins such as fish, chicken without skin, beans, eggs, and tofu. ? Avoid premade and processed foods. These tend to be higher in sodium,  added sugar, and fat.  Reduce your daily sodium intake. Most people with hypertension should eat less than 1,500 mg of sodium a day.  Limit alcohol intake to no more than 1 drink a day for nonpregnant women and 2 drinks a day for men. One drink equals 12 oz of beer, 5 oz of wine, or 1 oz of  hard liquor. Lifestyle  Work with your health care provider to maintain a healthy body weight, or to lose weight. Ask what an ideal weight is for you.  Get at least 30 minutes of exercise that causes your heart to beat faster (aerobic exercise) most days of the week. Activities may include walking, swimming, or biking.  Include exercise to strengthen your muscles (resistance exercise), such as weight lifting, as part of your weekly exercise routine. Try to do these types of exercises for 30 minutes at least 3 days a week.  Do not use any products that contain nicotine or tobacco, such as cigarettes and e-cigarettes. If you need help quitting, ask your health care provider.  Control any long-term (chronic) conditions you have, such as high cholesterol or diabetes. Monitoring  Monitor your blood pressure at home as told by your health care provider. Your personal target blood pressure may vary depending on your medical conditions, your age, and other factors.  Have your blood pressure checked regularly, as often as told by your health care provider. Working with your health care provider  Review all the medicines you take with your health care provider because there may be side effects or interactions.  Talk with your health care provider about your diet, exercise habits, and other lifestyle factors that may be contributing to hypertension.  Visit your health care provider regularly. Your health care provider can help you create and adjust your plan for managing hypertension. Will I need medicine to control my blood pressure? Your health care provider may prescribe medicine if lifestyle changes are not enough to get your blood pressure under control, and if:  Your systolic blood pressure is 130 or higher.  Your diastolic blood pressure is 80 or higher.  Take medicines only as told by your health care provider. Follow the directions carefully. Blood pressure medicines must be taken as  prescribed. The medicine does not work as well when you skip doses. Skipping doses also puts you at risk for problems. Contact a health care provider if:  You think you are having a reaction to medicines you have taken.  You have repeated (recurrent) headaches.  You feel dizzy.  You have swelling in your ankles.  You have trouble with your vision. Get help right away if:  You develop a severe headache or confusion.  You have unusual weakness or numbness, or you feel faint.  You have severe pain in your chest or abdomen.  You vomit repeatedly.  You have trouble breathing. Summary  Hypertension is when the force of blood pumping through your arteries is too strong. If this condition is not controlled, it may put you at risk for serious complications.  Your personal target blood pressure may vary depending on your medical conditions, your age, and other factors. For most people, a normal blood pressure is less than 120/80.  Hypertension is managed by lifestyle changes, medicines, or both. Lifestyle changes include weight loss, eating a healthy, low-sodium diet, exercising more, and limiting alcohol. This information is not intended to replace advice given to you by your health care provider. Make sure you discuss any questions you  have with your health care provider. Document Released: 07/27/2012 Document Revised: 09/30/2016 Document Reviewed: 09/30/2016 Elsevier Interactive Patient Education  2018 [employer].     IF you received an x-ray today, you will receive an invoice from [facility]. Please contact [facility] at [phone] with questions or concerns regarding your invoice.   IF you received labwork today, you will receive an invoice from [facility]. Please contact [facility] at [phone] with questions or concerns regarding your invoice.   Our billing staff will not be able to assist you with questions regarding bills from these companies.  You  will be contacted with the lab results as soon as they are available. The fastest way to get your results is to activate your My Chart account. Instructions are located on the last page of this paperwork. If you have not heard from [place] regarding the results in 2 weeks, please contact this office.

## 2018-06-01 NOTE — Progress Notes (Signed)
MRN: [id number] DOB: 1961/09/20  Subjective:   Kristina Sanders is a 57 y.o. female presenting for follow up for medication refill and lab work.   1) HTN: Currently managed with amlodipine 5mg  and losartan 100mg  daily.  Patient is not checking blood pressure at home .  Denies lightheadedness, dizziness, chronic headache, double vision, chest pain, shortness of breath, heart racing, palpitations, nausea, vomiting, abdominal pain, hematuria, lower leg swelling. Lifestyle: Has not been exercising frequently or eating the best diet but has just recently made the decision to start making healthier lifestyle changes. She and her daughter are about to start calorie/carb counting. She is also interested in joining a gym. Her husband is finally on board with these changes as well. Denies smoking, moderate alcohol intake. Denies any other aggravating or relieving factors, no other questions or concerns.  ROS per HPI  Kristina Sanders has a current medication list which includes the following prescription(s): acetaminophen, amlodipine, anastrozole, famotidine, fexofenadine, fluticasone, ibuprofen, losartan, and valacyclovir. Also has No Known Allergies.  Kristina Sanders  has a past medical history of Allergy, Anemia, GERD (gastroesophageal reflux disease), History of abnormal cervical Pap smear, HSV infection, HTN (hypertension), Malignant neoplasm of upper-inner quadrant of left breast in female, estrogen receptor positive ([place]) (04/22/2017), Ovarian cyst, Perennial allergic rhinitis, and Seasonal allergic rhinitis. Also  has a past surgical history that includes Cesarean section; excision of precancerous mole; Breast lumpectomy with radioactive seed and sentinel lymph node biopsy (Left, 05/17/2017); and Adenoidectomy.   Objective:   Vitals: BP 134/84 (BP Location: Right Arm, Patient Position: Sitting, Cuff Size: Normal)   Pulse 76   Temp 97.8 F (36.6 C) (Oral)   Resp 20   Ht 5' 6.81" (1.697 m)   Wt 182 lb 9.6 oz (82.8  kg)   LMP 07/17/2013 Comment: had slight spotting post insertion, IUD removed in 03/2017, blood  work confirmed- menopause  SpO2 99%   BMI 28.76 kg/m   Physical Exam  Constitutional: She is oriented to person, place, and time. She appears well-developed and well-nourished. No distress.  HENT:  Head: Normocephalic and atraumatic.  Mouth/Throat: Uvula is midline, oropharynx is clear and moist and mucous membranes are normal.  Eyes: Pupils are equal, round, and reactive to light. Conjunctivae and EOM are normal.  Neck: Normal range of motion.  Cardiovascular: Normal rate, regular rhythm, normal heart sounds and intact distal pulses.  Pulmonary/Chest: Effort normal and breath sounds normal. She has no wheezes. She has no rhonchi. She has no rales.  Musculoskeletal:       Right lower leg: She exhibits no swelling.       Left lower leg: She exhibits no swelling.  Neurological: She is alert and oriented to person, place, and time.  Skin: Skin is warm and dry.  Psychiatric: She has a normal mood and affect.  Vitals reviewed.   No results found for this or any previous visit (from the past 24 hour(s)).   BP Readings from Last 3 Encounters:  06/01/18 134/84  04/04/18 120/62  01/27/18 136/78   Wt Readings from Last 3 Encounters:  06/01/18 182 lb 9.6 oz (82.8 kg)  04/04/18 185 lb 8 oz (84.1 kg)  01/27/18 183 lb 3.2 oz (83.1 kg)    Assessment and Plan :  1. Essential hypertension Labs pending.  Educated patient why it is beneficial for her to have home blood pressure readings.  She agrees to start checking BP at least once a week.  Goal is 130/80.  Advised  to follow-up if consistently above this goal.  Otherwise follow-up in 6 months. - Basic metabolic panel - Lipid panel - Urinalysis, dipstick only - losartan (COZAAR) 100 MG tablet; TAKE 1 TABLET BY MOUTH DAILY  Dispense: 90 tablet; Refill: 1 - amLODipine (NORVASC) 5 MG tablet; TAKE 1 TABLET BY MOUTH DAILY  Dispense: 90 tablet;  Refill: 1  2. Prediabetes 3. Overweight (BMI 25.0-29.9) Labs pending.  Encouraged healthy lifestyle modifications.  Recommended using at such as Control and instrumentation engineer my fitness pal.  Since it is so hot outside, recommended going to the mall to walk until she can get a gym membership. - Hemoglobin A1c   Kristina Delaine, PA-C  [facility] 06/01/2018 9:17 AM

## 2018-07-28 NOTE — Progress Notes (Signed)
[place]  [phone] [phone]  Clinic Follow up Note   Patient Care Team: Donzetta Kohut as PCP - General (Physician Assistant) Megan Salon, MD as Consulting Physician (Gynecology) Harold Hedge, Darrick Grinder, MD (Allergy and Immunology) Truitt Merle, MD as Consulting Physician (Hematology) Jovita Kussmaul, MD as Consulting Physician (General Surgery) Eppie Gibson, MD as Attending Physician (Radiation Oncology) Gardenia Phlegm, NP as Nurse Practitioner (Hematology and Oncology)   Date of Service:  07/29/2018  CHIEF COMPLAINTS:  Follow up of a Malignant neoplasm of upper-inner quadrant of left breast in female, estrogen receptor positive.   Oncology History   Cancer Staging Malignant neoplasm of upper-inner quadrant of left breast in female, estrogen receptor positive ([place]) Staging form: Breast, AJCC 8th Edition - Clinical stage from 04/19/2017: Stage IA (cT1b, cN0, cM0, G2, ER: Positive, PR: Positive, HER2: Negative) - Signed by Truitt Merle, MD on 04/28/2017 - Pathologic stage from 05/17/2017: Stage IA (pT1c, pN0, cM0, G2, ER: Positive, PR: Positive, HER2: Negative) - Signed by Truitt Merle, MD on 07/24/2017       Malignant neoplasm of upper-inner quadrant of left breast in female, estrogen receptor positive ([place])   04/15/2017 Mammogram    Diagnostic mammogram and ultrasound of left breast:  The new 1.2 cm oval solid mass in the left breast 11:00 position, posterior depth, is highly suspicious for malignancy. The grouped mucus of calcium calcifications in the left breast at the 1:00 and 10:00 middle depth are benign. Ultrasound of the left axilla was negative for adenopathy.    04/19/2017 Receptors her2    Estrogen Receptor: 100%, POSITIVE, STRONG STAINING INTENSITY Progesterone Receptor: 100%, POSITIVE, STRONG STAINING INTENSITY Proliferation Marker Ki67: 30% HER2 (-)    04/19/2017 Initial Biopsy    Breast, left, needle core  biopsy INVASIVE DUCTAL CARCINOMA, GRADE 1 DUCTAL CARCINOMA IN SITU, GRADE 1    04/22/2017 Initial Diagnosis    Malignant neoplasm of upper-inner quadrant of left breast in female, estrogen receptor positive ([place])    05/17/2017 Surgery    LEFT BREAST LUMPECTOMY WITH RADIOACTIVE SEED AND SENTINEL LYMPH NODE BIOPSY by Dr. Marlou Starks    05/17/2017 Pathology Results    Diagnosis 05/17/17 1. 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 Lymph node, sentinel, biopsy, Left axillary ONE LYMPH NODE, NEGATIVE FOR CARCINOMA (0/1) 3. Lymph node, sentinel, biopsy, Left axillary ONE LYMPH NODE, NEGATIVE FOR CARCINOMA (0/1) 4. Breast, excision, Left new anterior margin SMALL FOCUS OF DUCTAL CARCINOMA IN SITU THE CARCINOMA IS 1 MM FROM THE ANTERIOR INKED MARGIN 5. Breast, excision, Left new medial margin BENIGN BREAST TISSUE NEGATIVE FOR CARCINOMA 6. Breast, excision, Left new superior margin FIBROCYSTIC CHANGES WITH CALCIFICATIONS NEGATIVE FOR CARCINOMA 7. Breast, excision, Left new lateral margin FIBROCYSTIC CHANGES WITH CALCIFICATIONS USUAL DUCTAL HYPERPLASIA NEGATIVE FOR CARCINOMA      05/17/2017 Miscellaneous    Oncotype   Recurrence score: 0 Low Risk 10-year risk of recurrence of 3% with Tamoxifen alone    06/24/2017 - 07/23/2017 Radiation Therapy    Radiation with Dr. Isidore Moos    07/2017 -  Anti-estrogen oral therapy    Anastrozole 1 mg daily      04/21/2018 Mammogram    04/21/2018 Mammogram Benign     HISTORY OF PRESENTING ILLNESS: 04/28/17 [facility]  [place] 57 y.o. female is here because of recent diagnosis of a Malignant neoplasm of upper-inner quadrant of left breast in female, estrogen receptor positive. She presents to the Multidisciplinary breast clinic today with her  husband.  In the past she normally gets a mammogram screening but missed 2016. She did not feel any lump or had an abnormal mammogram. Her HTN is controlled. She had her 1st child at 10 years old. Due to menorrhagia she was using mirena for 5 years. She had her mirena removed and was diagnosed as postmenopausal.    She reports to purposefully loosing weight because she is pre-diabetic and feels no other change in her body. Sometimes she gets cold sores.     GYN HISTORY  Menarchal: 12 LMP: in over 5 years due to Contraception  Contraceptive: mirena for 5 years  HRT: mirena was removed in 03/2017 G3P3    CURRENT THERAPY: Anastrozole 1 mg daily starting 07/2017   INTERVAL HISTORY:  NAZLI PENN is here for a follow up for her left breast cancer. She was last seen by me 6 months ago. During this time, she followed up with her PCP and OB/GYN providers. She is here alone at the clinic. She is tolerating anastrozole well and is experiencing mild hot flashes. She states that she is compliant with her calcium. Her BP is elevated today and she relates that to having a long bad day.   She tries to stay active and exercise but states that she experiences heel pain. She is monitoring her weight and trying to keep her weight under control.     MEDICAL HISTORY:  Past Medical History:  Diagnosis Date  . Allergy   . Anemia    hx  . GERD (gastroesophageal reflux disease)   . History of abnormal cervical Pap smear   . HSV infection    h/o  . HTN (hypertension)   . Malignant neoplasm of upper-inner quadrant of left breast in female, estrogen receptor positive ([place]) 04/22/2017  . Ovarian cyst    h/o  . Perennial allergic rhinitis    [place]   . Seasonal allergic rhinitis    [place]    SURGICAL HISTORY: Past Surgical History:  Procedure Laterality Date  . ADENOIDECTOMY     only adenoids still have tonsils  . BREAST LUMPECTOMY WITH RADIOACTIVE SEED AND SENTINEL LYMPH NODE BIOPSY Left  05/17/2017   Procedure: LEFT BREAST LUMPECTOMY WITH RADIOACTIVE SEED AND SENTINEL LYMPH NODE BIOPSY;  Surgeon: Jovita Kussmaul, MD;  Location: [facility];  Service: General;  Laterality: Left;  . CESAREAN SECTION     x3  . excision of precancerous mole     lft arm, chest    SOCIAL HISTORY: Social History   Socioeconomic History  . Marital status: Married    Spouse name: Not on file  . Number of children: 3  . Years of education: Not on file  . Highest education level: Not on file  Occupational History  . Not on file  Social Needs  . Financial resource strain: Not on file  . Food insecurity:    Worry: Not on file    Inability: Not on file  . Transportation needs:    Medical: Not on file    Non-medical: Not on file  Tobacco Use  . Smoking status: Never Smoker  . Smokeless tobacco: Never Used  Substance and Sexual Activity  . Alcohol use: Yes    Alcohol/week: 4.0 - 6.0 standard drinks    Types:  4 - 6 Standard drinks or equivalent per week    Comment: occ  . Drug use: No  . Sexual activity: Yes    Partners: Male    Birth control/protection: Other-see comments    Comment: vasectomy  Lifestyle  . Physical activity:    Days per week: Not on file    Minutes per session: Not on file  . Stress: Not on file  Relationships  . Social connections:    Talks on phone: Not on file    Gets together: Not on file    Attends religious service: Not on file    Active member of club or organization: Not on file    Attends meetings of clubs or organizations: Not on file    Relationship status: Not on file  . Intimate partner violence:    Fear of current or ex partner: Not on file    Emotionally abused: Not on file    Physically abused: Not on file    Forced sexual activity: Not on file  Other Topics Concern  . Not on file  Social History Narrative  . Not on file    FAMILY HISTORY: Family History  Problem Relation Age of Onset  . Mitral valve prolapse Mother   . Prostate cancer  Father   . Transient ischemic attack Father   . Breast cancer Paternal Aunt 65       bile duct cancer  . Colon cancer Neg Hx   . Rectal cancer Neg Hx   . Stomach cancer Neg Hx   . Esophageal cancer Neg Hx     ALLERGIES:  has No Known Allergies.  MEDICATIONS:  Current Outpatient Medications  Medication Sig Dispense Refill  . acetaminophen (TYLENOL) 500 MG tablet Take 1,000 mg by mouth every 6 (six) hours as needed for mild pain.    Marland Kitchen amLODipine (NORVASC) 5 MG tablet TAKE 1 TABLET BY MOUTH DAILY 90 tablet 1  . anastrozole (ARIMIDEX) 1 MG tablet Take 1 tablet (1 mg total) by mouth daily. 90 tablet 3  . famotidine (PEPCID) 20 MG tablet Take 20 mg by mouth daily as needed for heartburn or indigestion.    . fexofenadine (ALLEGRA) 180 MG tablet Take 180 mg by mouth as needed.     . fluticasone (FLONASE) 50 MCG/ACT nasal spray Place 2 sprays into both nostrils daily. (Patient taking differently: Place 2 sprays into both nostrils daily as needed for allergies. ) 16 g 6  . ibuprofen (ADVIL,MOTRIN) 200 MG tablet Take 200 mg by mouth every 6 (six) hours as needed.    Marland Kitchen losartan (COZAAR) 100 MG tablet TAKE 1 TABLET BY MOUTH DAILY 90 tablet 1  . valACYclovir (VALTREX) 500 MG tablet Take 500 mg by mouth 2 (two) times daily as needed (cold sores).      No current facility-administered medications for this visit.     REVIEW OF SYSTEMS:  Constitutional: Denies fevers, chills or abnormal night sweats (+) Manageable hot flashes Eyes: Denies blurriness of vision, double vision or watery eyes Ears, nose, mouth, throat, and face: Denies mucositis or sore throat Respiratory: Denies cough, dyspnea or wheezes Cardiovascular: Denies palpitation, chest discomfort or lower extremity swelling Gastrointestinal:  Denies nausea, heartburn or change in bowel habits Skin: Denies abnormal skin rashes MSK: (+) heel pain Lymphatics: Denies new lymphadenopathy or easy bruising Neurological:Denies numbness, tingling  or new weaknesses Behavioral/Psych: Mood is stable, no new changes  All other systems were reviewed with the patient and are negative.  PHYSICAL  EXAMINATION: ECOG PERFORMANCE STATUS: 0 - Asymptomatic  Vitals:   07/29/18 1506  BP: (!) 157/79  Pulse: 72  Resp: 19  Temp: 98.5 F (36.9 C)  SpO2: 100%   Filed Weights   07/29/18 1506  Weight: 189 lb (85.7 kg)    GENERAL:alert, no distress and comfortable SKIN: skin color, texture, turgor are normal, no rashes or significant lesions EYES: normal, conjunctiva are pink and non-injected, sclera clear OROPHARYNX:no exudate, no erythema and lips, buccal mucosa, and tongue normal  NECK: supple, thyroid normal size, non-tender, without nodularity LYMPH:  no palpable lymphadenopathy in the cervical, axillary or inguinal LUNGS: clear to auscultation and percussion with normal breathing effort HEART: regular rate & rhythm and no murmurs and no lower extremity edema ABDOMEN:abdomen soft, non-tender and normal bowel sounds Musculoskeletal:no cyanosis of digits and no clubbing  PSYCH: alert & oriented x 3 with fluent speech NEURO: no focal motor/sensory deficits Breasts: Breast inspection showed them to be symmetrical with no nipple discharge. Palpation of the breasts and axilla revealed no obvious mass that I could appreciate. S/p left breast lumpectomy: (+) incision in left axilla and around arora, healed well without significant scar tissue.  No lymphedema.   LABORATORY DATA:  I have reviewed the data as listed CBC Latest Ref Rng & Units 07/29/2018 01/27/2018 05/12/2017  WBC 3.9 - 10.3 K/uL 8.4 8.2 10.7(H)  Hemoglobin 11.6 - 15.9 g/dL 12.0 12.2 12.1  Hematocrit 34.8 - 46.6 % 35.4 36.2 36.5  Platelets 145 - 400 K/uL 220 209 330    CMP Latest Ref Rng & Units 07/29/2018 06/01/2018 01/27/2018  Glucose 70 - 99 mg/dL 101(H) 117(H) 100  BUN 6 - 20 mg/dL _0 Creatinine 0.44 - 1.00 mg/dL 0.72 0.60 0.82  Sodium 135 - 145 mmol/L 140 138 137   Potassium 3.5 - 5.1 mmol/L 3.7 4.3 4.2  Chloride 98 - 111 mmol/L 103 100 102  CO2 22 - 32 mmol/L _1 Calcium 8.9 - 10.3 mg/dL 10.3 9.7 10.4  Total Protein 6.5 - 8.1 g/dL 7.3 - 7.1  Total Bilirubin 0.3 - 1.2 mg/dL 1.0 - 0.8  Alkaline Phos 38 - 126 U/L 73 - 68  AST 15 - 41 U/L 25 - 18  ALT 0 - 44 U/L 24 - 19   PATHOLOGY  Diagnosis 05/17/17 1. 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 Lymph node, sentinel, biopsy, Left axillary ONE LYMPH NODE, NEGATIVE FOR CARCINOMA (0/1) 3. Lymph node, sentinel, biopsy, Left axillary ONE LYMPH NODE, NEGATIVE FOR CARCINOMA (0/1) 4. Breast, excision, Left new anterior margin SMALL FOCUS OF DUCTAL CARCINOMA IN SITU THE CARCINOMA IS 1 MM FROM THE ANTERIOR INKED MARGIN 5. Breast, excision, Left new medial margin BENIGN BREAST TISSUE NEGATIVE FOR CARCINOMA 6. Breast, excision, Left new superior margin FIBROCYSTIC CHANGES WITH CALCIFICATIONS NEGATIVE FOR CARCINOMA 7. Breast, excision, Left new lateral margin FIBROCYSTIC CHANGES WITH CALCIFICATIONS USUAL DUCTAL HYPERPLASIA NEGATIVE FOR CARCINOMA Microscopic Comment 1. BREAST, INVASIVE TUMOR Procedure: lumpectomy Laterality: Left Tumor Size: 1.4 cm Histologic Type: ductal carcinoma Grade: Tubular Differentiation: 3 Microscopic Comment(continued) Nuclear Pleomorphism: 2 Mitotic Count: 1 Ductal Carcinoma in Situ (DCIS): Present Extent of Tumor: Skin: Negative Nipple: Negative Skeletal muscle: Negative Margins: Invasive carcinoma, distance from closest margin: 0.4 cm from the anterior margin DCIS,  distance from closest margin: 0.1 cm from the anterior margin (part 4) Regional Lymph Nodes: Number of Lymph Nodes Examined: Number of Sentinel Lymph Nodes Examined: 2 Lymph  Nodes with Macrometastases: 0 Lymph Nodes with Micrometastases: 0 Lymph Nodes with Isolated Tumor Cells: 0 Breast Prognostic Profile: [id number] Estrogen Receptor: 100% Progesterone Receptor: 100% Her2: Negative Ki-67: 30% Best tumor block for sendout testing: 1A Pathologic Stage Classification (pTNM, AJCC 8th Edition): Primary Tumor (pT): pT1c Regional Lymph Nodes (pN): pN0 Distant Metastases (pM): pMx   Breast, left, needle core biopsy 04/19/17 INVASIVE DUCTAL CARCINOMA, GRADE 1 DUCTAL CARCINOMA IN SITU, GRADE 1 Estrogen Receptor: 100%, POSITIVE, STRONG STAINING INTENSITY Progesterone Receptor: 100%, POSITIVE, STRONG STAINING INTENSITY Proliferation Marker Ki67: 30% HER2 (-)  ONCOTYPE 05/17/17    RADIOGRAPHIC STUDIES: I have personally reviewed the radiological images as listed and agreed with the findings in the report. No results found.   04/21/2018 Mammogram   11/24/2017  DEXA - shows osteopenia with lowest density at Right Femur neck, T-Score of -1.4   Diagnostic mammogram and ultrasound of left breast on 04/15/2017 Impression; The new 1.2 cm oval solid mass in the left breast 11:00 position, posterior depth, is highly suspicious for malignancy. Biopsy is recommended. Ultrasound of the left axilla was negative for adenopathy.  ASSESSMENT & PLAN:  Kristina Sanders is a 57 y.o. caucasian female who has a history of Anemia, HTN, and Ovarian cysts.   1.  Malignant neoplasm of upper-inner quadrant of left breast in female, pT1cN0M0, stage IA, ER+/PR+/HER2-,  Grade 2 -I previously discussed her surgical path result in details -the Oncotype Dx result was reviewed with her in details. She has very low risk based on the recurrence score 0, which predicts 10 year distant recurrence after 5 years of tamoxifen 3%. There is no benefit of chemotherapy in the low risk group. -she had blood test with Dr. Sabra Heck on 02/26/17 had Piffard as 51, she is postmenopausal. --Given the strong ER  and PR positivity In the postmenopausal status, I do recommend adjuvant aromatase inhibitor to reduce her risk of cancer recurrence,  The potential benefit and side effects were discussed with her in great details. She is interested. -She started anastrozole in 07/2017.  She has been tolerating well, will continue for 5 to 7 years. -We previously discussed the breast cancer surveillance. She will continue annual screening mammogram, self exam, and a routine office visit with lab and exam with [place]. -She is clinically doing well. Lab reviewed, her CBC and CMP are within normal limits. Her physical exam showed no clinical concern for recurrence. -Mammogram on 04/21/2018 was benign  -F/u in 6 months  -Continue anastrozole.  2. HTN -Continue medications and follow-up of his PCP  3. Osteopenia  -Her 11/24/2017  DEXA shows osteopenia with lowest density at Right Femur neck, T-Score of -1.4. I reviewed results with patient.  -I discussed her anastrozole may reduce her bone density, we will continue to monitor with DEXA scans every 2 years.  -If next DEXA in shows marked reduction in density will consider medication to help strengthen her bone.  -Continue calcium and Vitamin D. I encouraged her to remain active and do tolerable weight bearing exercise.   Plan -Continue Anastrozole, refilled today -Lab and f/u in 6 months     No orders of the defined types were placed in this encounter.   All questions were answered. The patient knows to call the clinic with any problems, questions or concerns. I spent 15 minutes counseling  the patient face to face. The total time spent in the appointment was 20 minutes and more than 50% was on counseling.  Dierdre Searles Dweik am acting as scribe for Dr. Truitt Merle.  I have reviewed the above documentation for accuracy and completeness, and I agree with the above.      Truitt Merle, MD 07/29/2018

## 2018-07-29 ENCOUNTER — Inpatient Hospital Stay (HOSPITAL_BASED_OUTPATIENT_CLINIC_OR_DEPARTMENT_OTHER): Payer: BC Managed Care – PPO | Admitting: Hematology

## 2018-07-29 ENCOUNTER — Encounter: Payer: Self-pay | Admitting: Hematology

## 2018-07-29 ENCOUNTER — Telehealth: Payer: Self-pay | Admitting: Hematology

## 2018-07-29 ENCOUNTER — Inpatient Hospital Stay: Payer: BC Managed Care – PPO | Attending: Hematology

## 2018-07-29 VITALS — BP 157/79 | HR 72 | Temp 98.5°F | Resp 19 | Ht 66.81 in | Wt 189.0 lb

## 2018-07-29 DIAGNOSIS — M8580 Other specified disorders of bone density and structure, unspecified site: Secondary | ICD-10-CM | POA: Insufficient documentation

## 2018-07-29 DIAGNOSIS — Z79899 Other long term (current) drug therapy: Secondary | ICD-10-CM | POA: Diagnosis not present

## 2018-07-29 DIAGNOSIS — C50212 Malignant neoplasm of upper-inner quadrant of left female breast: Secondary | ICD-10-CM

## 2018-07-29 DIAGNOSIS — Z170 Estrogen receptor positive status [ER+]: Secondary | ICD-10-CM

## 2018-07-29 DIAGNOSIS — R7303 Prediabetes: Secondary | ICD-10-CM

## 2018-07-29 DIAGNOSIS — I10 Essential (primary) hypertension: Secondary | ICD-10-CM | POA: Diagnosis not present

## 2018-07-29 DIAGNOSIS — N951 Menopausal and female climacteric states: Secondary | ICD-10-CM

## 2018-07-29 LAB — CBC WITH DIFFERENTIAL/PLATELET
Basophils Absolute: 0 10*3/uL (ref 0.0–0.1)
Basophils Relative: 0 %
Eosinophils Absolute: 0.2 10*3/uL (ref 0.0–0.5)
Eosinophils Relative: 2 %
HEMATOCRIT: 35.4 % (ref 34.8–46.6)
HEMOGLOBIN: 12 g/dL (ref 11.6–15.9)
LYMPHS ABS: 1.9 10*3/uL (ref 0.9–3.3)
LYMPHS PCT: 22 %
MCH: 32.5 pg (ref 25.1–34.0)
MCHC: 33.9 g/dL (ref 31.5–36.0)
MCV: 95.9 fL (ref 79.5–101.0)
Monocytes Absolute: 0.7 10*3/uL (ref 0.1–0.9)
Monocytes Relative: 9 %
NEUTROS PCT: 67 %
Neutro Abs: 5.6 10*3/uL (ref 1.5–6.5)
Platelets: 220 10*3/uL (ref 145–400)
RBC: 3.69 MIL/uL — AB (ref 3.70–5.45)
RDW: 12.1 % (ref 11.2–14.5)
WBC: 8.4 10*3/uL (ref 3.9–10.3)

## 2018-07-29 LAB — COMPREHENSIVE METABOLIC PANEL
ALT: 24 U/L (ref 0–44)
AST: 25 U/L (ref 15–41)
Albumin: 4.5 g/dL (ref 3.5–5.0)
Alkaline Phosphatase: 73 U/L (ref 38–126)
Anion gap: 10 (ref 5–15)
BUN: 16 mg/dL (ref 6–20)
CHLORIDE: 103 mmol/L (ref 98–111)
CO2: 27 mmol/L (ref 22–32)
Calcium: 10.3 mg/dL (ref 8.9–10.3)
Creatinine, Ser: 0.72 mg/dL (ref 0.44–1.00)
GFR calc Af Amer: >60 mL/min (ref >60)
Glucose, Bld: 101 mg/dL — ABNORMAL HIGH (ref 70–99)
POTASSIUM: 3.7 mmol/L (ref 3.5–5.1)
SODIUM: 140 mmol/L (ref 135–145)
Total Bilirubin: 1 mg/dL (ref 0.3–1.2)
Total Protein: 7.3 g/dL (ref 6.5–8.1)

## 2018-07-29 MED ORDER — ANASTROZOLE 1 MG PO TABS: 1.0000 mg | ORAL_TABLET | Freq: Every day | ORAL | 3 refills | Status: DC

## 2018-07-29 NOTE — Telephone Encounter (Signed)
Scheduled appt per 9/13 los - pt aware of appts - no print out wanted per patient .

## 2018-11-21 ENCOUNTER — Other Ambulatory Visit: Payer: Self-pay | Admitting: Physician Assistant

## 2018-11-21 DIAGNOSIS — I10 Essential (primary) hypertension: Secondary | ICD-10-CM

## 2018-11-21 NOTE — Telephone Encounter (Signed)
Requested medication (s) are due for refill today: yes  Requested medication (s) are on the active medication list: yes  Last refill:  06/01/18 #90 with 1 refill  Future visit scheduled: no  Notes to clinic:  Last refilled by [place]. LOV on 06/01/18    Requested Prescriptions  Pending Prescriptions Disp Refills   amLODipine (NORVASC) 5 MG tablet [Pharmacy Med Name: AMLODIPINE BESYLATE 5MG  TABLETS] 90 tablet 1    Sig: TAKE 1 TABLET BY MOUTH DAILY     Cardiovascular:  Calcium Channel Blockers Failed - 11/21/2018  6:42 AM      Failed - Last BP in normal range    BP Readings from Last 1 Encounters:  07/29/18 (!) 157/79         Passed - Valid encounter within last 6 months    Recent Outpatient Visits          5 months ago Essential hypertension   Primary Care at [place], [place] D, PA-C   11 months ago Cough   Primary Care at [place], [place] D, PA-C   1 year ago Muscle spasms of neck   Primary Care at [facility], Gelene Mink, PA-C   1 year ago Essential hypertension   Primary Care at [place], [place] D, PA-C   1 year ago Acute bronchitis, unspecified organism   Primary Care at [facility], Gelene Mink, PA-C            losartan (COZAAR) 100 MG tablet [Pharmacy Med Name: LOSARTAN 100MG  TABLETS] 90 tablet 1    Sig: TAKE 1 TABLET BY MOUTH DAILY     Cardiovascular:  Angiotensin Receptor Blockers Failed - 11/21/2018  6:42 AM      Failed - Last BP in normal range    BP Readings from Last 1 Encounters:  07/29/18 (!) 157/79         Passed - Cr in normal range and within 180 days    Creatinine  Date Value Ref Range Status  04/28/2017 0.7 0.6 - 1.1 mg/dL Final   Creatinine, Ser  Date Value Ref Range Status  07/29/2018 0.72 0.44 - 1.00 mg/dL Final         Passed - K in normal range and within 180 days    Potassium  Date Value Ref Range Status  07/29/2018 3.7 3.5 - 5.1 mmol/L Final  04/28/2017 3.7 3.5 - 5.1 mEq/L Final        Passed - Patient is not pregnant      Passed - Valid encounter within last 6 months    Recent Outpatient Visits          5 months ago Essential hypertension   Primary Care at [place], [place] D, PA-C   11 months ago Cough   Primary Care at [place], [place] D, PA-C   1 year ago Muscle spasms of neck   Primary Care at [facility], Gelene Mink, PA-C   1 year ago Essential hypertension   Primary Care at [place], [place] D, PA-C   1 year ago Acute bronchitis, unspecified organism   Primary Care at [facility], [place], [place]

## 2019-01-26 ENCOUNTER — Inpatient Hospital Stay: Payer: BC Managed Care – PPO

## 2019-01-26 ENCOUNTER — Inpatient Hospital Stay: Payer: BC Managed Care – PPO | Attending: Hematology | Admitting: Hematology

## 2019-01-31 ENCOUNTER — Other Ambulatory Visit: Payer: Self-pay | Admitting: Family Medicine

## 2019-01-31 DIAGNOSIS — I10 Essential (primary) hypertension: Secondary | ICD-10-CM

## 2019-02-01 NOTE — Telephone Encounter (Signed)
Unable to leave a message on cell phone. Pt. Needs to schedule an appointment.

## 2019-02-17 ENCOUNTER — Other Ambulatory Visit: Payer: Self-pay | Admitting: Family Medicine

## 2019-02-17 DIAGNOSIS — I10 Essential (primary) hypertension: Secondary | ICD-10-CM

## 2019-02-28 ENCOUNTER — Telehealth: Payer: Self-pay | Admitting: Hematology

## 2019-02-28 NOTE — Telephone Encounter (Signed)
Returning patient's call about rescheduling a cancelled appointment from March. Patient requested appointment to be in June.

## 2019-03-27 ENCOUNTER — Other Ambulatory Visit: Payer: Self-pay

## 2019-03-27 ENCOUNTER — Telehealth (INDEPENDENT_AMBULATORY_CARE_PROVIDER_SITE_OTHER): Payer: BC Managed Care – PPO | Admitting: Family Medicine

## 2019-03-27 DIAGNOSIS — Z170 Estrogen receptor positive status [ER+]: Secondary | ICD-10-CM

## 2019-03-27 DIAGNOSIS — M542 Cervicalgia: Secondary | ICD-10-CM

## 2019-03-27 DIAGNOSIS — R7303 Prediabetes: Secondary | ICD-10-CM

## 2019-03-27 DIAGNOSIS — I10 Essential (primary) hypertension: Secondary | ICD-10-CM

## 2019-03-27 DIAGNOSIS — C50212 Malignant neoplasm of upper-inner quadrant of left female breast: Secondary | ICD-10-CM

## 2019-03-27 MED ORDER — CYCLOBENZAPRINE HCL 10 MG PO TABS: 10.0000 mg | ORAL_TABLET | Freq: Three times a day (TID) | ORAL | 0 refills | Status: DC | PRN

## 2019-03-27 MED ORDER — AMLODIPINE BESYLATE 5 MG PO TABS: 5.0000 mg | ORAL_TABLET | Freq: Every day | ORAL | 0 refills | Status: DC

## 2019-03-27 MED ORDER — LOSARTAN POTASSIUM 100 MG PO TABS: 100.0000 mg | ORAL_TABLET | Freq: Every day | ORAL | 0 refills | Status: DC

## 2019-03-27 NOTE — Progress Notes (Signed)
Virtual Visit Note  I connected with on 03/27/19 at patient by phone at 203pm and verified that I am speaking with the correct person using two identifiers. Kristina Sanders is currently located at home and patient is currently with them during visit. The provider, Rutherford Guys, MD is located in their office at time of visit.  I discussed the limitations, risks, security and privacy concerns of performing an evaluation and management service by telephone and the availability of in person appointments. I also discussed with the patient that there may be a patient responsible charge related to this service. The patient expressed understanding and agreed to proceed.   CC: neck pain, refill of BP meds  HPI ? 58 yo F with PMH right breast cancer, HTN and prediabetes  Last Romie Jumper GTXMI 2019 Dr Burr Medico Sept 2019 on anastrozole, has f/u June Sees breast surgeon q 6  Months Sees Dr Sabra Heck, gyn, yearly  Patient reports that she does not check BP at home Patient hoping to just do labs when she sees Dr Burr Medico Takes losartan 100mg  and amlodipine 5mg  Tolerating well, denies any side effects  Yesterday woke up with neck pain, felt like a crick Happens often, mostly when raining Getting worse, more frequent Pain along left side of cervical spine Worse when she tries to look up or to the left side Having increased stressors at home Denies any injuries or inciting event Side sleeper Denies any numbness or tingling radiating down her arm Has been using heat and ibu 600mg  q 6 hours  Works as a Network engineer  Component Value Date   CREATININE 0.72 07/29/2018   BUN 16 07/29/2018   NA 140 07/29/2018   K 3.7 07/29/2018   CL 103 07/29/2018   CO2 27 07/29/2018   Lab Results  Component Value Date   HGBA1C 5.4 06/01/2018   BP Readings from Last 3 Encounters:  07/29/18 (!) 157/79  06/01/18 134/84  04/04/18 120/62    No Known Allergies  Prior to Admission medications    Medication Sig Start Date End Date Taking? Authorizing Provider  acetaminophen (TYLENOL) 500 MG tablet Take 1,000 mg by mouth every 6 (six) hours as needed for mild pain.    [provider]  amLODipine (NORVASC) 5 MG tablet TAKE 1 TABLET BY MOUTH DAILY 02/01/19   Rutherford Guys, MD  anastrozole (ARIMIDEX) 1 MG tablet Take 1 tablet (1 mg total) by mouth daily. 07/29/18   Truitt Merle, MD  famotidine (PEPCID) 20 MG tablet Take 20 mg by mouth daily as needed for heartburn or indigestion.    [provider]  fexofenadine (ALLEGRA) 180 MG tablet Take 180 mg by mouth as needed.     [provider]  fluticasone (FLONASE) 50 MCG/ACT nasal spray Place 2 sprays into both nostrils daily. Patient taking differently: Place 2 sprays into both nostrils daily as needed for allergies.  05/06/17   McVey, Gelene Mink, PA-C  ibuprofen (ADVIL,MOTRIN) 200 MG tablet Take 200 mg by mouth every 6 (six) hours as needed.    [provider]  losartan (COZAAR) 100 MG tablet TAKE 1 TABLET BY MOUTH DAILY 02/01/19   Rutherford Guys, MD  valACYclovir (VALTREX) 500 MG tablet Take 500 mg by mouth 2 (two) times daily as needed (cold sores).  06/25/14   [provider]    Past Medical History:  Diagnosis Date  . Allergy   . Anemia    hx  .  GERD (gastroesophageal reflux disease)   . History of abnormal cervical Pap smear   . HSV infection    h/o  . HTN (hypertension)   . Malignant neoplasm of upper-inner quadrant of left breast in female, estrogen receptor positive ([place]) 04/22/2017  . Ovarian cyst    h/o  . Perennial allergic rhinitis    [place]   . Seasonal allergic rhinitis    [place]    Past Surgical History:  Procedure Laterality Date  . ADENOIDECTOMY     only adenoids still have tonsils  . BREAST LUMPECTOMY WITH RADIOACTIVE SEED AND SENTINEL LYMPH NODE BIOPSY Left 05/17/2017   Procedure: LEFT BREAST LUMPECTOMY WITH RADIOACTIVE SEED AND SENTINEL LYMPH NODE BIOPSY;   Surgeon: Jovita Kussmaul, MD;  Location: [facility];  Service: General;  Laterality: Left;  . CESAREAN SECTION     x3  . excision of precancerous mole     lft arm, chest    Social History   Tobacco Use  . Smoking status: Never Smoker  . Smokeless tobacco: Never Used  Substance Use Topics  . Alcohol use: Yes    Alcohol/week: 4.0 - 6.0 standard drinks    Types: 4 - 6 Standard drinks or equivalent per week    Comment: occ    Family History  Problem Relation Age of Onset  . Mitral valve prolapse Mother   . Prostate cancer Father   . Transient ischemic attack Father   . Breast cancer Paternal Aunt 68       bile duct cancer  . Colon cancer Neg Hx   . Rectal cancer Neg Hx   . Stomach cancer Neg Hx   . Esophageal cancer Neg Hx     ROS Per hpi  Objective  Vitals as reported by the patient: none   ASSESSMENT and PLAN  1. Neck pain on left side Discussed supportive measures, new meds r/se/b and RTC precautions.   2. Essential hypertension Discussed home monitoring of BP. meds refilled. Will hold off on labs until next OV if needed.  - losartan (COZAAR) 100 MG tablet; Take 1 tablet (100 mg total) by mouth daily. - amLODipine (NORVASC) 5 MG tablet; Take 1 tablet (5 mg total) by mouth daily.  3. Prediabetes Last a1c normal. Recheck at next OV if needed.  4. Malignant neoplasm of upper-inner quadrant of left breast in female, estrogen receptor positive ([place]) Managed by onc. On anastrozole.  Other orders - cyclobenzaprine (FLEXERIL) 10 MG tablet; Take 1 tablet (10 mg total) by mouth 3 (three) times daily as needed for muscle spasms.  FOLLOW-UP: 3 month   The above assessment and management plan was discussed with the patient. The patient verbalized understanding of and has agreed to the management plan. Patient is aware to call the clinic if symptoms persist or worsen. Patient is aware when to return to the clinic for a follow-up visit. Patient educated on when it is  appropriate to go to the emergency department.    I provided 14 minutes of non-face-to-face time during this encounter.  Rutherford Guys, MD Primary Care at [place], [place] [address] Ph.  [phone] Fax [phone]

## 2019-03-27 NOTE — Progress Notes (Signed)
Refill on bp meds and having pain in the neck. Thinks it may be due to stress.has aging parent with concerns of covid. Pain comes usually when its rainy

## 2019-04-12 ENCOUNTER — Telehealth: Payer: Self-pay

## 2019-04-12 NOTE — Telephone Encounter (Signed)
Faxed signed order for diagnostic mammogram back to [facility].  Sent to HIM for scanning to chart.

## 2019-04-17 ENCOUNTER — Telehealth: Payer: Self-pay

## 2019-04-17 NOTE — Telephone Encounter (Signed)
Faxed singed order for mammogram to [facility], sent to HIM for scanning to chart.

## 2019-04-25 ENCOUNTER — Other Ambulatory Visit: Payer: Self-pay | Admitting: *Deleted

## 2019-04-25 DIAGNOSIS — Z124 Encounter for screening for malignant neoplasm of cervix: Secondary | ICD-10-CM

## 2019-04-25 MED ORDER — NITROFURANTOIN MONOHYD MACRO 100 MG PO CAPS: ORAL_CAPSULE | ORAL | 0 refills | Status: DC

## 2019-04-25 NOTE — Telephone Encounter (Signed)
Medication refill request: macrobid  Last AEX:  04-04-18 SM  Next AEX: 07-07-2019 Last MMG (if hormonal medication request): 04-21-18 BIRADS 2 benign  Refill authorized: 04-04-18 #30, 3RF. Today, please advise.   Medication pended for #30, 0RF as patient has aex scheduled 07-07-2019. Please refill if appropriate.

## 2019-04-25 NOTE — Telephone Encounter (Signed)
Saw Dr. Pamella Pert for med refills

## 2019-05-01 ENCOUNTER — Encounter: Payer: Self-pay | Admitting: Nurse Practitioner

## 2019-05-01 ENCOUNTER — Other Ambulatory Visit: Payer: Self-pay

## 2019-05-01 ENCOUNTER — Inpatient Hospital Stay: Payer: BC Managed Care – PPO | Attending: Nurse Practitioner

## 2019-05-01 ENCOUNTER — Inpatient Hospital Stay (HOSPITAL_BASED_OUTPATIENT_CLINIC_OR_DEPARTMENT_OTHER): Payer: BC Managed Care – PPO | Admitting: Nurse Practitioner

## 2019-05-01 DIAGNOSIS — Z170 Estrogen receptor positive status [ER+]: Secondary | ICD-10-CM | POA: Diagnosis not present

## 2019-05-01 DIAGNOSIS — D649 Anemia, unspecified: Secondary | ICD-10-CM | POA: Diagnosis not present

## 2019-05-01 DIAGNOSIS — I10 Essential (primary) hypertension: Secondary | ICD-10-CM

## 2019-05-01 DIAGNOSIS — M8588 Other specified disorders of bone density and structure, other site: Secondary | ICD-10-CM

## 2019-05-01 DIAGNOSIS — Z79811 Long term (current) use of aromatase inhibitors: Secondary | ICD-10-CM | POA: Insufficient documentation

## 2019-05-01 DIAGNOSIS — C50212 Malignant neoplasm of upper-inner quadrant of left female breast: Secondary | ICD-10-CM | POA: Diagnosis present

## 2019-05-01 LAB — CBC WITH DIFFERENTIAL/PLATELET
Abs Immature Granulocytes: 0.03 10*3/uL (ref 0.00–0.07)
Basophils Absolute: 0.1 10*3/uL (ref 0.0–0.1)
Basophils Relative: 1 %
Eosinophils Absolute: 0.2 10*3/uL (ref 0.0–0.5)
Eosinophils Relative: 2 %
HCT: 35.4 % — ABNORMAL LOW (ref 36.0–46.0)
Hemoglobin: 12.2 g/dL (ref 12.0–15.0)
Immature Granulocytes: 0 %
Lymphocytes Relative: 19 %
Lymphs Abs: 1.8 10*3/uL (ref 0.7–4.0)
MCH: 33.2 pg (ref 26.0–34.0)
MCHC: 34.5 g/dL (ref 30.0–36.0)
MCV: 96.2 fL (ref 80.0–100.0)
Monocytes Absolute: 0.7 10*3/uL (ref 0.1–1.0)
Monocytes Relative: 8 %
Neutro Abs: 6.8 10*3/uL (ref 1.7–7.7)
Neutrophils Relative %: 70 %
Platelets: 211 10*3/uL (ref 150–400)
RBC: 3.68 MIL/uL — ABNORMAL LOW (ref 3.87–5.11)
RDW: 11.8 % (ref 11.5–15.5)
WBC: 9.5 10*3/uL (ref 4.0–10.5)
nRBC: 0 % (ref 0.0–0.2)

## 2019-05-01 LAB — COMPREHENSIVE METABOLIC PANEL
ALT: 40 U/L (ref 0–44)
AST: 30 U/L (ref 15–41)
Albumin: 4.4 g/dL (ref 3.5–5.0)
Alkaline Phosphatase: 66 U/L (ref 38–126)
Anion gap: 13 (ref 5–15)
BUN: 13 mg/dL (ref 6–20)
CO2: 24 mmol/L (ref 22–32)
Calcium: 8.9 mg/dL (ref 8.9–10.3)
Chloride: 100 mmol/L (ref 98–111)
Creatinine, Ser: 0.77 mg/dL (ref 0.44–1.00)
GFR calc Af Amer: >60 mL/min (ref >60)
GFR calc non Af Amer: >60 mL/min (ref >60)
Glucose, Bld: 93 mg/dL (ref 70–99)
Potassium: 3.9 mmol/L (ref 3.5–5.1)
Sodium: 137 mmol/L (ref 135–145)
Total Bilirubin: 0.8 mg/dL (ref 0.3–1.2)
Total Protein: 7.3 g/dL (ref 6.5–8.1)

## 2019-05-01 MED ORDER — ANASTROZOLE 1 MG PO TABS: 1.0000 mg | ORAL_TABLET | Freq: Every day | ORAL | 3 refills | Status: DC

## 2019-05-01 NOTE — Progress Notes (Signed)
[facility]   Telephone:(336) [phone] Fax:(336) [phone]   Clinic Follow up Note   Patient Care Team: Rutherford Guys, MD as PCP - General (Family Medicine) Megan Salon, MD as Consulting Physician (Gynecology) Harold Hedge, Darrick Grinder, MD (Allergy and Immunology) Truitt Merle, MD as Consulting Physician (Hematology) Jovita Kussmaul, MD as Consulting Physician (General Surgery) Eppie Gibson, MD as Attending Physician (Radiation Oncology) Gardenia Phlegm, NP as Nurse Practitioner (Hematology and Oncology) 05/01/2019  CHIEF COMPLAINT: f/u breast cancer   SUMMARY OF ONCOLOGIC HISTORY: Oncology History Overview Note  Cancer Staging Malignant neoplasm of upper-inner quadrant of left breast in female, estrogen receptor positive ([place]) Staging form: Breast, AJCC 8th Edition - Clinical stage from 04/19/2017: Stage IA (cT1b, cN0, cM0, G2, ER: Positive, PR: Positive, HER2: Negative) - Signed by Truitt Merle, MD on 04/28/2017 - Pathologic stage from 05/17/2017: Stage IA (pT1c, pN0, cM0, G2, ER: Positive, PR: Positive, HER2: Negative) - Signed by Truitt Merle, MD on 07/24/2017     Malignant neoplasm of upper-inner quadrant of left breast in female, estrogen receptor positive ([place])  04/15/2017 Mammogram   Diagnostic mammogram and ultrasound of left breast:  The new 1.2 cm oval solid mass in the left breast 11:00 position, posterior depth, is highly suspicious for malignancy. The grouped mucus of calcium calcifications in the left breast at the 1:00 and 10:00 middle depth are benign. Ultrasound of the left axilla was negative for adenopathy.   04/19/2017 Receptors her2   Estrogen Receptor: 100%, POSITIVE, STRONG STAINING INTENSITY Progesterone Receptor: 100%, POSITIVE, STRONG STAINING INTENSITY Proliferation Marker Ki67: 30% HER2 (-)   04/19/2017 Initial Biopsy   Breast, left, needle core biopsy INVASIVE DUCTAL CARCINOMA, GRADE 1 DUCTAL CARCINOMA IN SITU, GRADE 1   04/22/2017 Initial  Diagnosis   Malignant neoplasm of upper-inner quadrant of left breast in female, estrogen receptor positive ([place])   05/17/2017 Surgery   LEFT BREAST LUMPECTOMY WITH RADIOACTIVE SEED AND SENTINEL LYMPH NODE BIOPSY by Dr. Marlou Starks   05/17/2017 Pathology Results   Diagnosis 05/17/17 1. 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 Lymph node, sentinel, biopsy, Left axillary ONE LYMPH NODE, NEGATIVE FOR CARCINOMA (0/1) 3. Lymph node, sentinel, biopsy, Left axillary ONE LYMPH NODE, NEGATIVE FOR CARCINOMA (0/1) 4. Breast, excision, Left new anterior margin SMALL FOCUS OF DUCTAL CARCINOMA IN SITU THE CARCINOMA IS 1 MM FROM THE ANTERIOR INKED MARGIN 5. Breast, excision, Left new medial margin BENIGN BREAST TISSUE NEGATIVE FOR CARCINOMA 6. Breast, excision, Left new superior margin FIBROCYSTIC CHANGES WITH CALCIFICATIONS NEGATIVE FOR CARCINOMA 7. Breast, excision, Left new lateral margin FIBROCYSTIC CHANGES WITH CALCIFICATIONS USUAL DUCTAL HYPERPLASIA NEGATIVE FOR CARCINOMA     05/17/2017 Miscellaneous   Oncotype   Recurrence score: 0 Low Risk 10-year risk of recurrence of 3% with Tamoxifen alone   06/24/2017 - 07/23/2017 Radiation Therapy   Radiation with Dr. Isidore Moos   07/2017 -  Anti-estrogen oral therapy   Anastrozole 1 mg daily     04/21/2018 Mammogram   04/21/2018 Mammogram Benign     CURRENT THERAPY: anastrozole   INTERVAL HISTORY: Kristina Sanders returns for f/u as scheduled. She was last seen 07/2018. She recently had annual mammogram which she reports was negative and f/u with Dr. Marlou Starks. She denies changes in her health since then. Her  husband is immunocompromised so she is being very cautious with [phone]. She continues anastrozole. Has mild hot flash at night. She has  occasional pain in her right heel, but not in her joints. Has intermittent left breast soreness, not limiting activity. Appetite and weight are normal for her. Denies change in bowel habits. Denies GI or vaginal bleeding. Denies fever, chills, cough, chest pain, dyspnea.     MEDICAL HISTORY:  Past Medical History:  Diagnosis Date  . Allergy   . Anemia    hx  . GERD (gastroesophageal reflux disease)   . History of abnormal cervical Pap smear   . HSV infection    h/o  . HTN (hypertension)   . Malignant neoplasm of upper-inner quadrant of left breast in female, estrogen receptor positive ([place]) 04/22/2017  . Ovarian cyst    h/o  . Perennial allergic rhinitis    [place]   . Seasonal allergic rhinitis        SURGICAL HISTORY: Past Surgical History:  Procedure Laterality Date  . ADENOIDECTOMY     only adenoids still have tonsils  . BREAST LUMPECTOMY WITH RADIOACTIVE SEED AND SENTINEL LYMPH NODE BIOPSY Left 05/17/2017   Procedure: LEFT BREAST LUMPECTOMY WITH RADIOACTIVE SEED AND SENTINEL LYMPH NODE BIOPSY;  Surgeon: Jovita Kussmaul, MD;  Location: [facility];  Service: General;  Laterality: Left;  . CESAREAN SECTION     x3  . excision of precancerous mole     lft arm, chest    I have reviewed the social history and family history with the patient and they are unchanged from previous note.  ALLERGIES:  has No Known Allergies.  MEDICATIONS:  Current Outpatient Medications  Medication Sig Dispense Refill  . amLODipine (NORVASC) 5 MG tablet Take 1 tablet (5 mg total) by mouth daily. 90 tablet 0  . anastrozole (ARIMIDEX) 1 MG tablet Take 1 tablet (1 mg total) by mouth daily. 90 tablet 3  . fexofenadine (ALLEGRA) 180 MG tablet Take 180 mg by mouth as needed.     Marland Kitchen ibuprofen (ADVIL,MOTRIN) 200 MG tablet Take 200 mg by mouth every 6 (six) hours as needed.    Marland Kitchen losartan (COZAAR) 100 MG tablet Take 1 tablet (100 mg total) by mouth daily. 90 tablet 0  . nitrofurantoin,  macrocrystal-monohydrate, (MACROBID) 100 MG capsule Take as directed 30 capsule 0  . valACYclovir (VALTREX) 500 MG tablet Take 500 mg by mouth 2 (two) times daily as needed (cold sores).     Marland Kitchen acetaminophen (TYLENOL) 500 MG tablet Take 1,000 mg by mouth every 6 (six) hours as needed for mild pain.    . cyclobenzaprine (FLEXERIL) 10 MG tablet Take 1 tablet (10 mg total) by mouth 3 (three) times daily as needed for muscle spasms. 30 tablet 0  . famotidine (PEPCID) 20 MG tablet Take 20 mg by mouth daily as needed for heartburn or indigestion.    . fluticasone (FLONASE) 50 MCG/ACT nasal spray Place 2 sprays into both nostrils daily. (Patient taking differently: Place 2 sprays into both nostrils daily as needed for allergies. ) 16 g 6   No current facility-administered medications for this visit.     PHYSICAL EXAMINATION: ECOG PERFORMANCE STATUS: 0 - Asymptomatic  Vitals:   05/01/19 1528  BP: (!) 159/78  Pulse: 84  Resp: 18  Temp: 98.3 F (36.8 C)  SpO2: 100%   Filed Weights   05/01/19 1528  Weight: 183 lb (83 kg)    GENERAL:alert, no distress and comfortable SKIN: no rash  EYES: sclera clear LYMPH:  no palpable cervical, supraclavicular, or axillary lymphadenopathy  LUNGS: respirations even and unlabored  HEART:  no lower extremity edema ABDOMEN:abdomen round Musculoskeletal:no cyanosis of digits NEURO: alert & oriented x 3 with fluent speech, normal gait Breast exam: s/p left lumpectomy, incisions are well healed. No palpable mass in either breast or axilla that I could appreciate  Limited exam for covid19 outbreak   LABORATORY DATA:  I have reviewed the data as listed CBC Latest Ref Rng & Units 05/01/2019 07/29/2018 01/27/2018  WBC 4.0 - 10.5 K/uL 9.5 8.4 8.2  Hemoglobin 12.0 - 15.0 g/dL 12.2 12.0 12.2  Hematocrit 36.0 - 46.0 % 35.4(L) 35.4 36.2  Platelets 150 - 400 K/uL 211 220 209     CMP Latest Ref Rng & Units 05/01/2019 07/29/2018 06/01/2018  Glucose 70 - 99 mg/dL 93  101(H) 117(H)  BUN 6 - 20 mg/dL _0 Creatinine 0.44 - 1.00 mg/dL 0.77 0.72 0.60  Sodium 135 - 145 mmol/L 137 140 138  Potassium 3.5 - 5.1 mmol/L 3.9 3.7 4.3  Chloride 98 - 111 mmol/L 100 103 100  CO2 22 - 32 mmol/L _1 Calcium 8.9 - 10.3 mg/dL 8.9 10.3 9.7  Total Protein 6.5 - 8.1 g/dL 7.3 7.3 -  Total Bilirubin 0.3 - 1.2 mg/dL 0.8 1.0 -  Alkaline Phos 38 - 126 U/L 66 73 -  AST 15 - 41 U/L 30 25 -  ALT 0 - 44 U/L 40 24 -      RADIOGRAPHIC STUDIES: I have personally reviewed the radiological images as listed and agreed with the findings in the report. No results found.   ASSESSMENT & PLAN: Kristina Sanders is a 58 y.o. caucasian female who has a history of Anemia, HTN, and Ovarian cysts.   1.  Malignant neoplasm of upper-inner quadrant of left breast in female, pT1cN0M0, stage IA, ER+/PR+/HER2-,  Grade 2 -diagnosed 04/2017, s/p lumpectomy, adjuvant radiation, and adjuvant AI with anastrozole. Oncotype recurrence score is 0, no benefit of chemotherapy for low risk group.  -Ms. Landa appears well today. She continues anastrozole while she tolerates well overall, with mild hot flash. Plan to continue for total 5-7 years. I refilled for her today -annual mammogram was negative per patient, I have requested the report from [place].  -labs reviewed, CBC and CMP stable. Physical exam is unremarkable. No clinical concern for recurrence. Will continue surveillance.  -she will see Dr. Marlou Starks in 6 months, I plan to bring her back in 9 months to stagger f/u. She agrees. She will see Dr. Burr Medico.   2. HTN -on losartain; f/u with PCP   3. Osteopenia  -Her 11/24/2017  DEXA shows osteopenia with lowest density at Right Femur neck, T-Score of -1.4.  -she continues daily calcium and vitamin D; I encouraged her to do weight bearing exercises.  -next DEXA due in 11/2019  PLAN: -Continue surveillance -Continue anastrozole, refilled  -F/u with Dr. Marlou Starks in 6 months -Lab, f/u with Dr. Burr Medico  in 9 months  -DEXA due 11/2019 - continue calcium, vitamin D, exercise   All questions were answered. The patient knows to call the clinic with any problems, questions or concerns. No barriers to learning was detected.     Alla Feeling, NP 05/01/19

## 2019-05-02 ENCOUNTER — Telehealth: Payer: Self-pay | Admitting: Nurse Practitioner

## 2019-05-02 NOTE — Telephone Encounter (Signed)
Scheduled appt per 6/15 los. Spoke with patient and she is aware of appt date and time.

## 2019-05-09 ENCOUNTER — Telehealth: Payer: Self-pay

## 2019-05-09 NOTE — Telephone Encounter (Signed)
TC to [facility] [phone]) to get her mammogram per Lac[place]. Gave them fax# [phone] to send them over.

## 2019-05-18 ENCOUNTER — Other Ambulatory Visit: Payer: Self-pay | Admitting: Family Medicine

## 2019-05-18 DIAGNOSIS — I10 Essential (primary) hypertension: Secondary | ICD-10-CM

## 2019-05-18 NOTE — Telephone Encounter (Signed)
Requested medication (s) are due for refill today: amlodipine and losartan yes  Requested medication (s) are on the active medication list: yes  Last refill:  03/27/2019  Future visit scheduled: no  Notes to clinic:  No valid encounter in past 6 months    Requested Prescriptions  Pending Prescriptions Disp Refills   amLODipine (NORVASC) 5 MG tablet [Pharmacy Med Name: AMLODIPINE BESYLATE 5MG  TABLETS] 90 tablet 0    Sig: TAKE 1 TABLET(5 MG) BY MOUTH DAILY     Cardiovascular:  Calcium Channel Blockers Failed - 05/18/2019  7:39 AM      Failed - Last BP in normal range    BP Readings from Last 1 Encounters:  05/01/19 (!) 159/78         Failed - Valid encounter within last 6 months    Recent Outpatient Visits          11 months ago Essential hypertension   Primary Care at [place], [place] D, PA-C   1 year ago Cough   Primary Care at [place], [place] D, PA-C   1 year ago Muscle spasms of neck   Primary Care at [facility], Gelene Mink, PA-C   1 year ago Essential hypertension   Primary Care at [place], [place] D, PA-C   2 years ago Acute bronchitis, unspecified organism   Primary Care at [facility], [place], PA-C              losartan (COZAAR) 100 MG tablet [Pharmacy Med Name: LOSARTAN 100MG  TABLETS] 90 tablet 0    Sig: TAKE 1 TABLET(100 MG) BY MOUTH DAILY     Cardiovascular:  Angiotensin Receptor Blockers Failed - 05/18/2019  7:39 AM      Failed - Last BP in normal range    BP Readings from Last 1 Encounters:  05/01/19 (!) 159/78         Failed - Valid encounter within last 6 months    Recent Outpatient Visits          11 months ago Essential hypertension   Primary Care at [place], [place] D, PA-C   1 year ago Cough   Primary Care at [facility], [place] D, PA-C   1 year ago Muscle spasms of neck   Primary Care at [facility], Gelene Mink, PA-C   1 year ago Essential hypertension   Primary Care at  [place], [place] D, PA-C   2 years ago Acute bronchitis, unspecified organism   Primary Care at [facility], [place], [place]             Passed - Cr in normal range and within 180 days    Creatinine  Date Value Ref Range Status  04/28/2017 0.7 0.6 - 1.1 mg/dL Final   Creatinine, Ser  Date Value Ref Range Status  05/01/2019 0.77 0.44 - 1.00 mg/dL Final         Passed - K in normal range and within 180 days    Potassium  Date Value Ref Range Status  05/01/2019 3.9 3.5 - 5.1 mmol/L Final  04/28/2017 3.7 3.5 - 5.1 mEq/L Final         Passed - Patient is not pregnant

## 2019-07-04 ENCOUNTER — Telehealth: Payer: Self-pay | Admitting: Family Medicine

## 2019-07-04 ENCOUNTER — Other Ambulatory Visit: Payer: Self-pay | Admitting: Family Medicine

## 2019-07-04 DIAGNOSIS — I10 Essential (primary) hypertension: Secondary | ICD-10-CM

## 2019-07-04 NOTE — Telephone Encounter (Signed)
Medication: losartan (COZAAR) 100 MG tablet  amLODipine (NORVASC) 5 MG tablet    Patient is requesting a refill.    Pharmacy: [facility], [facility]

## 2019-07-04 NOTE — Telephone Encounter (Signed)
Pt has OV sith [place] 08.25.2020 at 4:20 pt needs refills sent amLODipine (NORVASC) 5 MG tablet And  losartan (COZAAR) 100 MG tablet   Called into [facility] on file

## 2019-07-05 ENCOUNTER — Other Ambulatory Visit: Payer: Self-pay

## 2019-07-05 DIAGNOSIS — I10 Essential (primary) hypertension: Secondary | ICD-10-CM

## 2019-07-05 MED ORDER — AMLODIPINE BESYLATE 5 MG PO TABS: ORAL_TABLET | ORAL | 0 refills | Status: DC

## 2019-07-05 MED ORDER — LOSARTAN POTASSIUM 100 MG PO TABS: ORAL_TABLET | ORAL | 0 refills | Status: DC

## 2019-07-05 NOTE — Telephone Encounter (Signed)
Rx was sent to pharmacy. 

## 2019-07-07 ENCOUNTER — Encounter: Payer: Self-pay | Admitting: Obstetrics & Gynecology

## 2019-07-07 ENCOUNTER — Other Ambulatory Visit (HOSPITAL_COMMUNITY)
Admission: RE | Admit: 2019-07-07 | Discharge: 2019-07-07 | Disposition: A | Discharge status: Home / Self Care | Payer: BC Managed Care – PPO | Source: Ambulatory Visit | Attending: Obstetrics & Gynecology | Admitting: Obstetrics & Gynecology

## 2019-07-07 ENCOUNTER — Ambulatory Visit: Payer: BC Managed Care – PPO | Admitting: Obstetrics & Gynecology

## 2019-07-07 ENCOUNTER — Other Ambulatory Visit: Payer: Self-pay

## 2019-07-07 VITALS — BP 150/76 | HR 100 | Temp 98.0°F | Ht 65.5 in | Wt 179.0 lb

## 2019-07-07 DIAGNOSIS — Z01419 Encounter for gynecological examination (general) (routine) without abnormal findings: Secondary | ICD-10-CM

## 2019-07-07 DIAGNOSIS — Z124 Encounter for screening for malignant neoplasm of cervix: Secondary | ICD-10-CM

## 2019-07-07 NOTE — Patient Instructions (Signed)
Outpatient Pharmacy at [facility] [address] [place],  [place]  [address]  Main: [phone] 

## 2019-07-07 NOTE — Progress Notes (Signed)
58 y.o. [id number] Married White or Caucasian female here for annual exam.  Doing well.  Denies vaginal bleeding.  Had oncology appt in June and MMG in June.    Has follow up on Tuesday with Dr. Pamella Pert.  Will have lipids tested.    Patient's last menstrual period was 07/17/2013.          Sexually active: Yes.    The current method of family planning is post menopausal status.    Exercising: Yes.    walking Smoker:  no  Health Maintenance: Pap:  04/04/18 Neg. HR HPV:neg   03/22/17 Neg  History of abnormal Pap:  Yes, 2016 atypical endo cells MMG:   04/27/19 Diagnostic Bilateral. BIRADS2:benign. F/u 1 year  Colonoscopy: 02/22/15 normal. F/u 10 years  BMD:   11/24/17 Osteopenia  TDaP:  2019 Pneumonia vaccine(s):  n/a Shingrix:  Reviewed for this today Hep C testing: 04/14/16 Neg  Screening Labs: PCP   reports that she has never smoked. She has never used smokeless tobacco. She reports current alcohol use of about 2.0 standard drinks of alcohol per week. She reports that she does not use drugs.  Past Medical History:  Diagnosis Date  . Allergy   . Anemia    hx  . GERD (gastroesophageal reflux disease)   . History of abnormal cervical Pap smear   . HSV infection    h/o  . HTN (hypertension)   . Malignant neoplasm of upper-inner quadrant of left breast in female, estrogen receptor positive ([place]) 04/22/2017  . Ovarian cyst    h/o  . Perennial allergic rhinitis    [place]   . Seasonal allergic rhinitis    [place]    Past Surgical History:  Procedure Laterality Date  . ADENOIDECTOMY     only adenoids still have tonsils  . BREAST LUMPECTOMY WITH RADIOACTIVE SEED AND SENTINEL LYMPH NODE BIOPSY Left 05/17/2017   Procedure: LEFT BREAST LUMPECTOMY WITH RADIOACTIVE SEED AND SENTINEL LYMPH NODE BIOPSY;  Surgeon: Jovita Kussmaul, MD;  Location: [facility];  Service: General;  Laterality: Left;  . CESAREAN SECTION     x3  . excision of precancerous mole     lft arm, chest    Current Outpatient  Medications  Medication Sig Dispense Refill  . acetaminophen (TYLENOL) 500 MG tablet Take 1,000 mg by mouth every 6 (six) hours as needed for mild pain.    Marland Kitchen amLODipine (NORVASC) 5 MG tablet TAKE 1 TABLET(5 MG) BY MOUTH DAILY 30 tablet 0  . anastrozole (ARIMIDEX) 1 MG tablet Take 1 tablet (1 mg total) by mouth daily. 90 tablet 3  . Diclofenac Sodium (PENNSAID) 2 % SOLN Pennsaid 20 mg/gram/actuation (2 %) topical soln in metered-dose pump  APPLY 2 PUMPS (40 MG) TO THE AFFECTED AREA BY TOPICAL ROUTE 2 TIMES PER DAY    . famotidine (PEPCID) 20 MG tablet Take 20 mg by mouth daily as needed for heartburn or indigestion.    . fexofenadine (ALLEGRA) 180 MG tablet Take 180 mg by mouth as needed.     . fluticasone (FLONASE) 50 MCG/ACT nasal spray Place 2 sprays into both nostrils daily. (Patient taking differently: Place 2 sprays into both nostrils daily as needed for allergies. ) 16 g 6  . ibuprofen (ADVIL,MOTRIN) 200 MG tablet Take 200 mg by mouth every 6 (six) hours as needed.    Marland Kitchen losartan (COZAAR) 100 MG tablet TAKE 1 TABLET(100 MG) BY MOUTH DAILY 30 tablet 0  . nitrofurantoin, macrocrystal-monohydrate, (MACROBID) 100 MG capsule  Take as directed 30 capsule 0  . valACYclovir (VALTREX) 500 MG tablet Take 500 mg by mouth 2 (two) times daily as needed (cold sores).      No current facility-administered medications for this visit.     Family History  Problem Relation Age of Onset  . Mitral valve prolapse Mother   . Prostate cancer Father   . Transient ischemic attack Father   . Breast cancer Paternal Aunt 90       bile duct cancer  . Colon cancer Neg Hx   . Rectal cancer Neg Hx   . Stomach cancer Neg Hx   . Esophageal cancer Neg Hx     Review of Systems  All other systems reviewed and are negative.   Exam:   BP (!) 150/76 (BP Location: Right Arm, Cuff Size: Normal)   Pulse 100   Temp 98 F (36.7 C) (Temporal)   Ht 5' 5.5" (1.664 m)   Wt 179 lb (81.2 kg)   LMP 07/17/2013 Comment: had  slight spotting post insertion, IUD removed in 03/2017, blood  work confirmed- menopause  BMI 29.33 kg/m     Height: 5' 5.5" (166.4 cm)  Ht Readings from Last 3 Encounters:  07/07/19 5' 5.5" (1.664 m)  05/01/19 5\' 6"  (1.676 m)  07/29/18 5' 6.81" (1.697 m)    General appearance: alert, cooperative and appears stated age Head: Normocephalic, without obvious abnormality, atraumatic Neck: no adenopathy, supple, symmetrical, trachea midline and thyroid normal to inspection and palpation Lungs: clear to auscultation bilaterally Breasts: normal appearance, no masses or tenderness Heart: regular rate and rhythm Abdomen: soft, non-tender; bowel sounds normal; no masses,  no organomegaly Extremities: extremities normal, atraumatic, no cyanosis or edema Skin: Skin color, texture, turgor normal. No rashes or lesions Lymph nodes: Cervical, supraclavicular, and axillary nodes normal. No abnormal inguinal nodes palpated Neurologic: Grossly normal   Pelvic: External genitalia:  no lesions              Urethra:  normal appearing urethra with no masses, tenderness or lesions              Bartholins and Skenes: normal                 Vagina: normal appearing vagina with normal color and discharge, no lesions              Cervix: no lesions              Pap taken: Yes.   Bimanual Exam:  Uterus:  normal size, contour, position, consistency, mobility, non-tender              Adnexa: normal adnexa and no mass, fullness, tenderness               Rectovaginal: Confirms               Anus:  normal sphincter tone, no lesions  Chaperone was present for exam.  A:  Well Woman with normal exam PMP, no HRT H/o 1A invasive ductal breast ca, s/p lumpectomy, 2018.  On anastrozole. Hypertension H/o recurrent UTIs H/o atypical endometrial cell pap 2015  P:   Mammogram guideliens reviewed.  Doing diagnostic yearly. pap smear obtained.  Pt desires yearly.  Specifically discussed this with pt. Lab work planned  with Dr. Pamella Pert next week Information about shingrix given Plan BMD next year return annually or prn

## 2019-07-10 LAB — CYTOLOGY - PAP: Diagnosis: NEGATIVE

## 2019-07-11 ENCOUNTER — Ambulatory Visit: Payer: BC Managed Care – PPO | Admitting: Family Medicine

## 2019-07-11 ENCOUNTER — Other Ambulatory Visit: Payer: Self-pay

## 2019-07-11 ENCOUNTER — Telehealth (INDEPENDENT_AMBULATORY_CARE_PROVIDER_SITE_OTHER): Payer: BC Managed Care – PPO | Admitting: Family Medicine

## 2019-07-11 VITALS — BP 150/86

## 2019-07-11 DIAGNOSIS — I10 Essential (primary) hypertension: Secondary | ICD-10-CM | POA: Diagnosis not present

## 2019-07-11 DIAGNOSIS — R7303 Prediabetes: Secondary | ICD-10-CM

## 2019-07-11 MED ORDER — AMLODIPINE BESYLATE 10 MG PO TABS: 10.0000 mg | ORAL_TABLET | Freq: Every day | ORAL | 1 refills | Status: DC

## 2019-07-11 MED ORDER — LOSARTAN POTASSIUM 100 MG PO TABS: ORAL_TABLET | ORAL | 1 refills | Status: DC

## 2019-07-11 NOTE — Progress Notes (Signed)
Virtual Visit Note  I connected with patient on 07/11/19 at 505pm by phone and verified that I am speaking with the correct person using two identifiers. Kristina Sanders is currently located at car and patient is currently with them during visit. The provider, Rutherford Guys, MD is located in their office at time of visit.  I discussed the limitations, risks, security and privacy concerns of performing an evaluation and management service by telephone and the availability of in person appointments. I also discussed with the patient that there may be a patient responsible charge related to this service. The patient expressed understanding and agreed to proceed.   CC: followup on HTN  HPI   Patient was turned around to telemedicine due to not passing Temp check She checks her temp at work and has been fine She reports she was waiting in a hot car She denies any symptoms or known exposures  She does not check BP at home Weight has been stable ? Lab Results  Component Value Date   CREATININE 0.77 05/01/2019   BUN 13 05/01/2019   NA 137 05/01/2019   K 3.9 05/01/2019   CL 100 05/01/2019   CO2 24 05/01/2019   BP Readings from Last 3 Encounters:  07/07/19 (!) 150/76  05/01/19 (!) 159/78  07/29/18 (!) 157/79    No Known Allergies  Prior to Admission medications   Medication Sig Start Date End Date Taking? Authorizing Provider  acetaminophen (TYLENOL) 500 MG tablet Take 1,000 mg by mouth every 6 (six) hours as needed for mild pain.    [provider]  amLODipine (NORVASC) 5 MG tablet TAKE 1 TABLET(5 MG) BY MOUTH DAILY 07/05/19   Rutherford Guys, MD  anastrozole (ARIMIDEX) 1 MG tablet Take 1 tablet (1 mg total) by mouth daily. 05/01/19   Alla Feeling, NP  Diclofenac Sodium (PENNSAID) 2 % SOLN Pennsaid 20 mg/gram/actuation (2 %) topical soln in metered-dose pump  APPLY 2 PUMPS (40 MG) TO THE AFFECTED AREA BY TOPICAL ROUTE 2 TIMES PER DAY    [provider]   famotidine (PEPCID) 20 MG tablet Take 20 mg by mouth daily as needed for heartburn or indigestion.    [provider]  fexofenadine (ALLEGRA) 180 MG tablet Take 180 mg by mouth as needed.     [provider]  fluticasone (FLONASE) 50 MCG/ACT nasal spray Place 2 sprays into both nostrils daily. Patient taking differently: Place 2 sprays into both nostrils daily as needed for allergies.  05/06/17   McVey, Gelene Mink, PA-C  ibuprofen (ADVIL,MOTRIN) 200 MG tablet Take 200 mg by mouth every 6 (six) hours as needed.    [provider]  losartan (COZAAR) 100 MG tablet TAKE 1 TABLET(100 MG) BY MOUTH DAILY 07/05/19   Rutherford Guys, MD  nitrofurantoin, macrocrystal-monohydrate, (MACROBID) 100 MG capsule Take as directed 04/25/19   Megan Salon, MD  valACYclovir (VALTREX) 500 MG tablet Take 500 mg by mouth 2 (two) times daily as needed (cold sores).  06/25/14   [provider]    Past Medical History:  Diagnosis Date  . Allergy   . Anemia    hx  . GERD (gastroesophageal reflux disease)   . History of abnormal cervical Pap smear   . HSV infection    h/o  . HTN (hypertension)   . Malignant neoplasm of upper-inner quadrant of left breast in female, estrogen receptor positive ([place]) 04/22/2017  . Ovarian cyst    h/o  .  Perennial allergic rhinitis    [place]   . Seasonal allergic rhinitis    [place]    Past Surgical History:  Procedure Laterality Date  . ADENOIDECTOMY     only adenoids still have tonsils  . BREAST LUMPECTOMY WITH RADIOACTIVE SEED AND SENTINEL LYMPH NODE BIOPSY Left 05/17/2017   Procedure: LEFT BREAST LUMPECTOMY WITH RADIOACTIVE SEED AND SENTINEL LYMPH NODE BIOPSY;  Surgeon: Jovita Kussmaul, MD;  Location: [facility];  Service: General;  Laterality: Left;  . CESAREAN SECTION     x3  . excision of precancerous mole     lft arm, chest    Social History   Tobacco Use  . Smoking status: Never Smoker  . Smokeless tobacco: Never Used  Substance  Use Topics  . Alcohol use: Yes    Alcohol/week: 2.0 standard drinks    Types: 2 Standard drinks or equivalent per week    Family History  Problem Relation Age of Onset  . Mitral valve prolapse Mother   . Prostate cancer Father   . Transient ischemic attack Father   . Breast cancer Paternal Aunt 75       bile duct cancer  . Colon cancer Neg Hx   . Rectal cancer Neg Hx   . Stomach cancer Neg Hx   . Esophageal cancer Neg Hx     Review of Systems  Constitutional: Negative for chills and fever.  Respiratory: Negative for cough and shortness of breath.   Cardiovascular: Negative for chest pain, palpitations and leg swelling.  Gastrointestinal: Negative for abdominal pain, nausea and vomiting.    Objective  Vitals as reported by the patient: per above   ASSESSMENT and PLAN  1. Essential hypertension Not controlled. Increasing amlodipine to 10mg  daily. Cont with losartan 100mg  daily. Reviewed r/se/b.  - losartan (COZAAR) 100 MG tablet; TAKE 1 TABLET(100 MG) BY MOUTH DAILY - amLODipine (NORVASC) 10 MG tablet; Take 1 tablet (10 mg total) by mouth daily. - Lipid panel; Future  2. Prediabetes Last a1c normal. Labs pending. Cont with LFM. - Hemoglobin A1c; Future  FOLLOW-UP: 2 weeks nurse visit for fasting labs and BP check and 3 months in office visit with me   The above assessment and management plan was discussed with the patient. The patient verbalized understanding of and has agreed to the management plan. Patient is aware to call the clinic if symptoms persist or worsen. Patient is aware when to return to the clinic for a follow-up visit. Patient educated on when it is appropriate to go to the emergency department.    I provided 10 minutes of non-face-to-face time during this encounter.  Rutherford Guys, MD [facility], [place] [address] Ph.  [phone] Fax [phone]

## 2019-07-11 NOTE — Progress Notes (Signed)
pt had to be turned into tele due to fever rising from 99.3. Needs refills on amlodipine and losartan. No other medical issues

## 2019-07-12 ENCOUNTER — Telehealth: Payer: Self-pay | Admitting: Family Medicine

## 2019-07-12 NOTE — Progress Notes (Signed)
Called pt to schedule appt and mailbox was full

## 2019-07-12 NOTE — Telephone Encounter (Signed)
Called pt to schedule a 2 week fasting labs appt, and 3 month f/u per Dr. Pamella Pert. Mailbox was full

## 2019-10-02 ENCOUNTER — Other Ambulatory Visit: Payer: Self-pay

## 2019-10-02 DIAGNOSIS — Z20822 Contact with and (suspected) exposure to covid-19: Secondary | ICD-10-CM

## 2019-10-05 LAB — NOVEL CORONAVIRUS, NAA: SARS-CoV-2, NAA: NOT DETECTED

## 2019-12-29 ENCOUNTER — Ambulatory Visit: Payer: BC Managed Care – PPO

## 2020-01-13 ENCOUNTER — Ambulatory Visit: Payer: BC Managed Care – PPO | Attending: Internal Medicine

## 2020-01-13 ENCOUNTER — Other Ambulatory Visit: Payer: Self-pay

## 2020-01-13 DIAGNOSIS — Z23 Encounter for immunization: Secondary | ICD-10-CM

## 2020-01-13 NOTE — Progress Notes (Signed)
   Covid-19 Vaccination Clinic  Name:  Kristina Sanders    MRN: [id number] DOB: January 28, 1961  01/13/2020  Kristina Sanders was observed post Covid-19 immunization for 15 minutes without incidence. She was provided with Vaccine Information Sheet and instruction to access the V-Safe system.   Kristina Sanders was instructed to call 911 with any severe reactions post vaccine: Marland Kitchen Difficulty breathing  . Swelling of your face and throat  . A fast heartbeat  . A bad rash all over your body  . Dizziness and weakness    Immunizations Administered    Name Date Dose VIS Date Route   Pfizer COVID-19 Vaccine 01/13/2020 11:18 AM 0.3 mL 10/27/2019 Intramuscular   Manufacturer: [place]   Lot: [id number]   [place]: [id number]

## 2020-01-22 NOTE — Progress Notes (Signed)
Kristina Sanders   [phone] [phone]   Clinic Follow up Note   Patient Care Team: Rutherford Guys, MD as PCP - General (Family Medicine) Megan Salon, MD as Consulting Physician (Gynecology) Harold Hedge, Darrick Grinder, MD (Allergy and Immunology) Truitt Merle, MD as Consulting Physician (Hematology) Jovita Kussmaul, MD as Consulting Physician (General Surgery) Eppie Gibson, MD as Attending Physician (Radiation Oncology) Gardenia Phlegm, NP as Nurse Practitioner (Hematology and Oncology)  Date of Service:  01/29/2020  CHIEF COMPLAINT: F/u of left breast cancer   SUMMARY OF ONCOLOGIC HISTORY: Oncology History Overview Note  Cancer Staging Malignant neoplasm of upper-inner quadrant of left breast in female, estrogen receptor positive ([place]) Staging form: Breast, AJCC 8th Edition - Clinical stage from 04/19/2017: Stage IA (cT1b, cN0, cM0, G2, ER: Positive, PR: Positive, HER2: Negative) - Signed by Truitt Merle, MD on 04/28/2017 - Pathologic stage from 05/17/2017: Stage IA (pT1c, pN0, cM0, G2, ER: Positive, PR: Positive, HER2: Negative) - Signed by Truitt Merle, MD on 07/24/2017     Malignant neoplasm of upper-inner quadrant of left breast in female, estrogen receptor positive ([place])  04/15/2017 Mammogram   Diagnostic mammogram and ultrasound of left breast:  The new 1.2 cm oval solid mass in the left breast 11:00 position, posterior depth, is highly suspicious for malignancy. The grouped mucus of calcium calcifications in the left breast at the 1:00 and 10:00 middle depth are benign. Ultrasound of the left axilla was negative for adenopathy.   04/19/2017 Receptors her2   Estrogen Receptor: 100%, POSITIVE, STRONG STAINING INTENSITY Progesterone Receptor: 100%, POSITIVE, STRONG STAINING INTENSITY Proliferation Marker Ki67: 30% HER2 (-)   04/19/2017 Initial Biopsy   Breast, left, needle core biopsy INVASIVE DUCTAL CARCINOMA, GRADE 1 DUCTAL CARCINOMA IN SITU, GRADE  1   04/22/2017 Initial Diagnosis   Malignant neoplasm of upper-inner quadrant of left breast in female, estrogen receptor positive ([place])   05/17/2017 Surgery   LEFT BREAST LUMPECTOMY WITH RADIOACTIVE SEED AND SENTINEL LYMPH NODE BIOPSY by Dr. Marlou Starks   05/17/2017 Pathology Results   Diagnosis 05/17/17 1. 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 Lymph node, sentinel, biopsy, Left axillary ONE LYMPH NODE, NEGATIVE FOR CARCINOMA (0/1) 3. Lymph node, sentinel, biopsy, Left axillary ONE LYMPH NODE, NEGATIVE FOR CARCINOMA (0/1) 4. Breast, excision, Left new anterior margin SMALL FOCUS OF DUCTAL CARCINOMA IN SITU THE CARCINOMA IS 1 MM FROM THE ANTERIOR INKED MARGIN 5. Breast, excision, Left new medial margin BENIGN BREAST TISSUE NEGATIVE FOR CARCINOMA 6. Breast, excision, Left new superior margin FIBROCYSTIC CHANGES WITH CALCIFICATIONS NEGATIVE FOR CARCINOMA 7. Breast, excision, Left new lateral margin FIBROCYSTIC CHANGES WITH CALCIFICATIONS USUAL DUCTAL HYPERPLASIA NEGATIVE FOR CARCINOMA     05/17/2017 Miscellaneous   Oncotype   Recurrence score: 0 Low Risk 10-year risk of recurrence of 3% with Tamoxifen alone   06/24/2017 - 07/23/2017 Radiation Therapy   Radiation with Dr. Isidore Moos   07/2017 -  Anti-estrogen oral therapy   Anastrozole 1 mg daily     04/21/2018 Mammogram   04/21/2018 Mammogram Benign      CURRENT THERAPY:  Anastrozole 74m daily starting 07/2017.   INTERVAL HISTORY:  [name] here for a follow up of left breast cancer. She was last seen by me in 07/2018 and  seen by NP Lacie in 04/2019 in interim. She presents to the clinic alone. She notes she is doing well. She notes she is tolerating anastrozole well. She notes she is having a lot of pain in her  achilles tendon. She was seen by ortho who notes she has calcifications there. She has being doing PT. She denies any other joint pain. She has occasional night sweats which is manageable. She notes her BP is better at home when she is sitting still. I reviewed her medication list with her. She tried to double her Amlodapine with her PCP but reduced due to LE edema.  She notes she received her first COVID19 vaccine.    REVIEW OF SYSTEMS:   Constitutional: Denies fevers, chills or abnormal weight loss (+) Manageable hot flashes.  Eyes: Denies blurriness of vision Ears, nose, mouth, throat, and face: Denies mucositis or sore throat Respiratory: Denies cough, dyspnea or wheezes Cardiovascular: Denies palpitation, chest discomfort or lower extremity swelling Gastrointestinal:  Denies nausea, heartburn or change in bowel habits Skin: Denies abnormal skin  MSK: (+) Achilles pain Lymphatics: Denies new lymphadenopathy or easy bruising Neurological:Denies numbness, tingling or new weaknesses Behavioral/Psych: Mood is stable, no new changes  All other systems were reviewed with the patient and are negative.  MEDICAL HISTORY:  Past Medical History:  Diagnosis Date  . Allergy   . Anemia    hx  . GERD (gastroesophageal reflux disease)   . History of abnormal cervical Pap smear   . HSV infection    h/o  . HTN (hypertension)   . Malignant neoplasm of upper-inner quadrant of left breast in female, estrogen receptor positive (Kristina Sanders) 04/22/2017  . Ovarian cyst    h/o  . Perennial allergic rhinitis    [place]   . Seasonal allergic rhinitis        SURGICAL HISTORY: Past Surgical History:  Procedure Laterality Date  . ADENOIDECTOMY     only adenoids still have tonsils  . BREAST LUMPECTOMY WITH RADIOACTIVE SEED AND SENTINEL LYMPH NODE BIOPSY Left 05/17/2017   Procedure: LEFT BREAST LUMPECTOMY WITH RADIOACTIVE SEED AND SENTINEL LYMPH NODE BIOPSY;  Surgeon: Jovita Kussmaul, MD;  Location: [facility];   Service: General;  Laterality: Left;  . CESAREAN SECTION     x3  . excision of precancerous mole     lft arm, chest    I have reviewed the social history and family history with the patient and they are unchanged from previous note.  ALLERGIES:  has No Known Allergies.  MEDICATIONS:  Current Outpatient Medications  Medication Sig Dispense Refill  . acetaminophen (TYLENOL) 500 MG tablet Take 1,000 mg by mouth every 6 (six) hours as needed for mild pain.    Marland Kitchen amLODipine (NORVASC) 10 MG tablet Take 1 tablet (10 mg total) by mouth daily. 90 tablet 1  . anastrozole (ARIMIDEX) 1 MG tablet Take 1 tablet (1 mg total) by mouth daily. 90 tablet 3  . Diclofenac Sodium (PENNSAID) 2 % SOLN Pennsaid 20 mg/gram/actuation (2 %) topical soln in metered-dose pump  APPLY 2 PUMPS (40 MG) TO THE AFFECTED AREA BY TOPICAL ROUTE 2 TIMES PER DAY    . famotidine (PEPCID) 20 MG tablet Take 20 mg by mouth daily as needed for heartburn or indigestion.    . fexofenadine (ALLEGRA) 180 MG tablet Take 180 mg by mouth as needed.     . fluticasone (FLONASE) 50 MCG/ACT nasal spray Place 2 sprays into both nostrils daily. (Patient taking differently: Place 2 sprays  into both nostrils daily as needed for allergies. ) 16 g 6  . ibuprofen (ADVIL,MOTRIN) 200 MG tablet Take 200 mg by mouth every 6 (six) hours as needed.    Marland Kitchen losartan (COZAAR) 100 MG tablet TAKE 1 TABLET(100 MG) BY MOUTH DAILY 90 tablet 1  . nitrofurantoin, macrocrystal-monohydrate, (MACROBID) 100 MG capsule Take as directed 30 capsule 0  . valACYclovir (VALTREX) 500 MG tablet Take 500 mg by mouth 2 (two) times daily as needed (cold sores).      No current facility-administered medications for this visit.    PHYSICAL EXAMINATION: ECOG PERFORMANCE STATUS: 0 - Asymptomatic  Vitals:   01/29/20 1609 01/29/20 1617  BP: (!) 160/76 (!) 151/78  Pulse: 81   Resp: 20   Temp: 97.8 F (36.6 C)   SpO2: 100%    Filed Weights   01/29/20 1609  Weight: 177 lb 12.8  oz (80.6 kg)    GENERAL:alert, no distress and comfortable SKIN: skin color, texture, turgor are normal, no rashes or significant lesions EYES: normal, Conjunctiva are pink and non-injected, sclera clear  NECK: supple, thyroid normal size, non-tender, without nodularity LYMPH:  no palpable lymphadenopathy in the cervical, axillary  LUNGS: clear to auscultation and percussion with normal breathing effort HEART: regular rate & rhythm and no murmurs and no lower extremity edema ABDOMEN:abdomen soft, non-tender and normal bowel sounds Musculoskeletal:no cyanosis of digits and no clubbing  NEURO: alert & oriented x 3 with fluent speech, no focal motor/sensory deficits BREAST: S/p left lumpectomy: Surgical incision healed well. No palpable mass, nodules or adenopathy bilaterally. Breast exam benign.   LABORATORY DATA:  I have reviewed the data as listed CBC Latest Ref Rng & Units 01/29/2020 05/01/2019 07/29/2018  WBC 4.0 - 10.5 K/uL 9.2 9.5 8.4  Hemoglobin 12.0 - 15.0 g/dL 12.6 12.2 12.0  Hematocrit 36.0 - 46.0 % 37.3 35.4(L) 35.4  Platelets 150 - 400 K/uL 258 211 220     CMP Latest Ref Rng & Units 01/29/2020 05/01/2019 07/29/2018  Glucose 70 - 99 mg/dL 116(H) 93 101(H)  BUN 6 - 20 mg/dL _0 Creatinine 0.44 - 1.00 mg/dL 0.72 0.77 0.72  Sodium 135 - 145 mmol/L 139 137 140  Potassium 3.5 - 5.1 mmol/L 3.7 3.9 3.7  Chloride 98 - 111 mmol/L 102 100 103  CO2 22 - 32 mmol/L _1 Calcium 8.9 - 10.3 mg/dL 9.9 8.9 10.3  Total Protein 6.5 - 8.1 g/dL 7.2 7.3 7.3  Total Bilirubin 0.3 - 1.2 mg/dL 0.6 0.8 1.0  Alkaline Phos 38 - 126 U/L 76 66 73  AST 15 - 41 U/L _2 ALT 0 - 44 U/L 19 40 24      RADIOGRAPHIC STUDIES: I have personally reviewed the radiological images as listed and agreed with the findings in the report. No results found.   ASSESSMENT & PLAN:  MACRINA LEHNERT is a 59 y.o. female with    1.  Malignant neoplasm of upper-inner quadrant of left breast in female,  pT1cN0M0, stage IA, ER+/PR+/HER2-,  Grade 2 -She was initially diagnosed in 04/2017. She is s/p left lumpectomy, adjuvant radiation.  -the Oncotype Dx result was very low risk, RS 0. There is no benefit of chemotherapy in the low risk group. -She had blood test with Dr. Sabra Heck on 02/26/17 had [place] as 63, she is postmenopausal. -Given the strong ER and PR positivity In the postmenopausal status, I started her on Anastrozole in 07/2017. She has been  tolerating well, will continue for 5 to 7 years. -She is clinically doing well. Lab reviewed, her CBC and CMP are within normal limits except BG 116. Her physical exam and her 04/2019 mammogram were unremarkable. There is no clinical concern for recurrence. -Continue surveillance. Next Mammogram in 04/2020  -Continue anastrozole. She has had recent isolated Achilles pain which I discussed is likely not caused by Anastrozole. She has no other joint pain only manageable night sweats.  -F/u in with me in 1 year and she can f/u with Dr. Marlou Starks in interim.    2. HTN -Continue medications and follow-up of his PCP -Her BP is usually elevated in clinic. She notes she can be normal when at home and sitting down.  -I recommend she aconitine her medications, reduce salt in diet and work on weight loss and staying active.   3. Osteopenia  -Her 11/24/2017 DEXA shows osteopenia with lowest density at Right Femur neck, T-Score of -1.4.  -She continues daily calcium and vitamin D; I encouraged her to do weight bearing exercises.  -next DEXA in 01/2020 at [facility], I ordered today.    Plan -DEXA in 01/2020 at [facility] in 04/2020 -Lab and f/u in 1 year with NP Lacie    No problem-specific Assessment & Plan notes found for this encounter.   Orders Placed This Encounter  Procedures  . DG Bone Density    Standing Status:   Future    Standing Expiration Date:   01/28/2021    Order Specific Question:   Reason for Exam (SYMPTOM  OR DIAGNOSIS  REQUIRED)    Answer:   screening    Order Specific Question:   Is the patient pregnant?    Answer:   No    Order Specific Question:   Preferred imaging location?    Answer:   External  . MM DIAG BREAST TOMO BILATERAL    Standing Status:   Future    Standing Expiration Date:   01/28/2021    Scheduling Instructions:     [facility]    Order Specific Question:   Reason for Exam (SYMPTOM  OR DIAGNOSIS REQUIRED)    Answer:   screening    Order Specific Question:   Is the patient pregnant?    Answer:   No    Order Specific Question:   Preferred imaging location?    Answer:   External   All questions were answered. The patient knows to call the clinic with any problems, questions or concerns. No barriers to learning was detected. The total time spent in the appointment was 25 minutes.     Truitt Merle, MD 01/29/2020   I, Joslyn Devon, am acting as scribe for Truitt Merle, MD.   I have reviewed the above documentation for accuracy and completeness, and I agree with the above.

## 2020-01-29 ENCOUNTER — Other Ambulatory Visit: Payer: Self-pay

## 2020-01-29 ENCOUNTER — Inpatient Hospital Stay: Payer: BC Managed Care – PPO | Attending: Hematology

## 2020-01-29 ENCOUNTER — Inpatient Hospital Stay: Payer: BC Managed Care – PPO | Admitting: Hematology

## 2020-01-29 VITALS — BP 151/78 | HR 81 | Temp 97.8°F | Resp 20 | Ht 65.5 in | Wt 177.8 lb

## 2020-01-29 DIAGNOSIS — C50212 Malignant neoplasm of upper-inner quadrant of left female breast: Secondary | ICD-10-CM | POA: Insufficient documentation

## 2020-01-29 DIAGNOSIS — I10 Essential (primary) hypertension: Secondary | ICD-10-CM

## 2020-01-29 DIAGNOSIS — Z79811 Long term (current) use of aromatase inhibitors: Secondary | ICD-10-CM | POA: Insufficient documentation

## 2020-01-29 DIAGNOSIS — E2839 Other primary ovarian failure: Secondary | ICD-10-CM | POA: Diagnosis not present

## 2020-01-29 DIAGNOSIS — Z791 Long term (current) use of non-steroidal anti-inflammatories (NSAID): Secondary | ICD-10-CM | POA: Insufficient documentation

## 2020-01-29 DIAGNOSIS — M8580 Other specified disorders of bone density and structure, unspecified site: Secondary | ICD-10-CM | POA: Diagnosis not present

## 2020-01-29 DIAGNOSIS — Z170 Estrogen receptor positive status [ER+]: Secondary | ICD-10-CM

## 2020-01-29 DIAGNOSIS — Z79899 Other long term (current) drug therapy: Secondary | ICD-10-CM | POA: Insufficient documentation

## 2020-01-29 LAB — CBC WITH DIFFERENTIAL/PLATELET
Abs Immature Granulocytes: 0.02 10*3/uL (ref 0.00–0.07)
Basophils Absolute: 0.1 10*3/uL (ref 0.0–0.1)
Basophils Relative: 1 %
Eosinophils Absolute: 0.2 10*3/uL (ref 0.0–0.5)
Eosinophils Relative: 2 %
HCT: 37.3 % (ref 36.0–46.0)
Hemoglobin: 12.6 g/dL (ref 12.0–15.0)
Immature Granulocytes: 0 %
Lymphocytes Relative: 23 %
Lymphs Abs: 2.2 10*3/uL (ref 0.7–4.0)
MCH: 32.3 pg (ref 26.0–34.0)
MCHC: 33.8 g/dL (ref 30.0–36.0)
MCV: 95.6 fL (ref 80.0–100.0)
Monocytes Absolute: 0.8 10*3/uL (ref 0.1–1.0)
Monocytes Relative: 9 %
Neutro Abs: 6 10*3/uL (ref 1.7–7.7)
Neutrophils Relative %: 65 %
Platelets: 258 10*3/uL (ref 150–400)
RBC: 3.9 MIL/uL (ref 3.87–5.11)
RDW: 11.8 % (ref 11.5–15.5)
WBC: 9.2 10*3/uL (ref 4.0–10.5)
nRBC: 0 % (ref 0.0–0.2)

## 2020-01-29 LAB — COMPREHENSIVE METABOLIC PANEL
ALT: 19 U/L (ref 0–44)
AST: 19 U/L (ref 15–41)
Albumin: 4.4 g/dL (ref 3.5–5.0)
Alkaline Phosphatase: 76 U/L (ref 38–126)
Anion gap: 11 (ref 5–15)
BUN: 14 mg/dL (ref 6–20)
CO2: 26 mmol/L (ref 22–32)
Calcium: 9.9 mg/dL (ref 8.9–10.3)
Chloride: 102 mmol/L (ref 98–111)
Creatinine, Ser: 0.72 mg/dL (ref 0.44–1.00)
GFR calc Af Amer: >60 mL/min (ref >60)
GFR calc non Af Amer: >60 mL/min (ref >60)
Glucose, Bld: 116 mg/dL — ABNORMAL HIGH (ref 70–99)
Potassium: 3.7 mmol/L (ref 3.5–5.1)
Sodium: 139 mmol/L (ref 135–145)
Total Bilirubin: 0.6 mg/dL (ref 0.3–1.2)
Total Protein: 7.2 g/dL (ref 6.5–8.1)

## 2020-01-29 NOTE — Progress Notes (Signed)
error 

## 2020-01-30 ENCOUNTER — Encounter: Payer: Self-pay | Admitting: Hematology

## 2020-01-30 ENCOUNTER — Telehealth: Payer: Self-pay | Admitting: Hematology

## 2020-01-30 NOTE — Telephone Encounter (Signed)
Scheduled appt per 3/15 los.  Sent a message to HIM pool to get a calendar mailed out. 

## 2020-02-01 ENCOUNTER — Telehealth: Payer: Self-pay | Admitting: Family Medicine

## 2020-02-01 ENCOUNTER — Other Ambulatory Visit: Payer: Self-pay | Admitting: Family Medicine

## 2020-02-01 DIAGNOSIS — I10 Essential (primary) hypertension: Secondary | ICD-10-CM

## 2020-02-01 NOTE — Telephone Encounter (Signed)
Pt needs appt for lab work before refills are given

## 2020-02-01 NOTE — Telephone Encounter (Signed)
Appointment in 2 weeks- refilled per protocol.

## 2020-02-01 NOTE — Telephone Encounter (Signed)
Pt has appt scheduled 04/01/201 needs a few to hold her over. What is the name of the medicationlosartan ([place]) 100 MG tablet [id number]   Have you contacted your pharmacy to request a refill? y  Which pharmacy would you like this sent to?  [facility] [place], [address]  [place], [place] [address]  Phone:  [phone] Fax:  [phone]      Patient notified that their request is being sent to the clinical staff for review and that they should receive a call once it is complete. If they do not receive a call within 72 hours they can check with their pharmacy or our office.

## 2020-02-03 ENCOUNTER — Ambulatory Visit: Payer: BC Managed Care – PPO | Attending: Internal Medicine

## 2020-02-03 DIAGNOSIS — Z23 Encounter for immunization: Secondary | ICD-10-CM

## 2020-02-03 NOTE — Progress Notes (Signed)
   Covid-19 Vaccination Clinic  Name:  Kristina Sanders    MRN: [id number] DOB: [birth year]  02/03/2020  Ms. Kristina Sanders was observed post Covid-19 immunization for 15 minutes without incident. She was provided with Vaccine Information Sheet and instruction to access the V-Safe system.   Ms. Kristina Sanders was instructed to call 911 with any severe reactions post vaccine: Marland Kitchen Difficulty breathing  . Swelling of face and throat  . A fast heartbeat  . A bad rash all over body  . Dizziness and weakness   Immunizations Administered    Name Date Dose VIS Date Route   Pfizer COVID-19 Vaccine 02/03/2020 12:14 PM 0.3 mL 10/27/2019 Intramuscular   Manufacturer: [place]   Lot: [id number]   [place]: [id number]

## 2020-02-06 NOTE — Telephone Encounter (Signed)
Spoke with pt and she informed me that she was given a refill for 30 days and she recently had labs completed by the [facility]

## 2020-02-07 ENCOUNTER — Ambulatory Visit: Payer: BC Managed Care – PPO

## 2020-02-07 NOTE — Telephone Encounter (Signed)
See Kristina Sanders note

## 2020-02-08 ENCOUNTER — Telehealth: Payer: Self-pay

## 2020-02-08 NOTE — Telephone Encounter (Signed)
Attempted to contact Kristina Sanders regarding dexa scan.  Unable to leave vm as mailbox is full.

## 2020-02-15 ENCOUNTER — Ambulatory Visit: Payer: BC Managed Care – PPO | Admitting: Family Medicine

## 2020-02-15 ENCOUNTER — Other Ambulatory Visit: Payer: Self-pay

## 2020-02-15 ENCOUNTER — Encounter: Payer: Self-pay | Admitting: Family Medicine

## 2020-02-15 VITALS — BP 151/76 | HR 92 | Temp 98.0°F | Resp 16 | Ht 65.5 in | Wt 180.0 lb

## 2020-02-15 DIAGNOSIS — I10 Essential (primary) hypertension: Secondary | ICD-10-CM

## 2020-02-15 DIAGNOSIS — R7303 Prediabetes: Secondary | ICD-10-CM | POA: Diagnosis not present

## 2020-02-15 LAB — LIPID PANEL
Chol/HDL Ratio: 4.1 ratio (ref 0.0–4.4)
Cholesterol, Total: 231 mg/dL — ABNORMAL HIGH (ref 100–199)
HDL: 57 mg/dL (ref >39)
LDL Chol Calc (NIH): 151 mg/dL — ABNORMAL HIGH (ref 0–99)
Triglycerides: 130 mg/dL (ref 0–149)
VLDL Cholesterol Cal: 23 mg/dL (ref 5–40)

## 2020-02-15 LAB — HEMOGLOBIN A1C
Est. average glucose Bld gHb Est-mCnc: 111 mg/dL
Hgb A1c MFr Bld: 5.5 % (ref 4.8–5.6)

## 2020-02-15 MED ORDER — LOSARTAN POTASSIUM-HCTZ 100-12.5 MG PO TABS: 1.0000 | ORAL_TABLET | Freq: Every day | ORAL | 1 refills | Status: DC

## 2020-02-15 MED ORDER — AMLODIPINE BESYLATE 5 MG PO TABS: 5.0000 mg | ORAL_TABLET | Freq: Every day | ORAL | 1 refills | Status: DC

## 2020-02-15 NOTE — Progress Notes (Signed)
4/1/20219:54 AM  Kristina Sanders August 26, 1961, 59 y.o., female [id number]  Chief Complaint  Patient presents with  . Medication Refill    amlodipine and losartan    HPI:   Patient is a 59 y.o. female with past medical history significant for HTN, prediabetes, right breast cancer who presents today for routine followup  Last OV aug 2020 - increased amlodipine to 10mg  but did not tolerate due to increase in swelling, back to 5mg  She continues to take lisinopril 100mg   At home readings BP 135-145 Walking/exercise limited due to achilles tendonitis Currently wearing nitroglycerin patch by podiatry to increase blood flow to area Saw med onc last week  Overall doing well and has no acute concerns today  Lab Results  Component Value Date   HGBA1C 5.4 06/01/2018    Lab Results  Component Value Date   CREATININE 0.72 01/29/2020   BUN 14 01/29/2020   NA 139 01/29/2020   K 3.7 01/29/2020   CL 102 01/29/2020   CO2 26 01/29/2020   Lab Results  Component Value Date   ALT 19 01/29/2020   AST 19 01/29/2020   ALKPHOS 76 01/29/2020   BILITOT 0.6 01/29/2020    Depression screen PHQ 2/9 02/15/2020 07/11/2019 07/11/2019  Decreased Interest 0 0 0  Down, Depressed, Hopeless 0 0 0  PHQ - 2 Score 0 0 0    Fall Risk  02/15/2020 07/11/2019 07/11/2019 03/27/2019 06/01/2018  Falls in the past year? 0 0 0 0 No  Number falls in past yr: - 0 0 0 -  Injury with Fall? - 0 0 0 -  Follow up Falls evaluation completed - - - -     No Known Allergies  Prior to Admission medications   Medication Sig Start Date End Date Taking? Authorizing Provider  acetaminophen (TYLENOL) 500 MG tablet Take 1,000 mg by mouth every 6 (six) hours as needed for mild pain.   Yes [provider]  amLODipine (NORVASC) 10 MG tablet Take 1 tablet (10 mg total) by mouth daily. 07/11/19  Yes Rutherford Guys, MD  anastrozole (ARIMIDEX) 1 MG tablet Take 1 tablet (1 mg total) by mouth daily. 05/01/19  Yes Alla Feeling,  NP  famotidine (PEPCID) 20 MG tablet Take 20 mg by mouth daily as needed for heartburn or indigestion.   Yes [provider]  fexofenadine (ALLEGRA) 180 MG tablet Take 180 mg by mouth as needed.    Yes [provider]  ibuprofen (ADVIL,MOTRIN) 200 MG tablet Take 200 mg by mouth every 6 (six) hours as needed.   Yes [provider]  losartan (COZAAR) 100 MG tablet TAKE 1 TABLET(100 MG) BY MOUTH DAILY 02/01/20  Yes Rutherford Guys, MD  valACYclovir (VALTREX) 500 MG tablet Take 500 mg by mouth 2 (two) times daily as needed (cold sores).  06/25/14  Yes [provider]  Diclofenac Sodium (PENNSAID) 2 % SOLN Pennsaid 20 mg/gram/actuation (2 %) topical soln in metered-dose pump  APPLY 2 PUMPS (40 MG) TO THE AFFECTED AREA BY TOPICAL ROUTE 2 TIMES PER DAY    [provider]  fluticasone (FLONASE) 50 MCG/ACT nasal spray Place 2 sprays into both nostrils daily. Patient not taking: Reported on 02/15/2020 05/06/17   McVey, Gelene Mink, PA-C  nitrofurantoin, macrocrystal-monohydrate, (MACROBID) 100 MG capsule Take as directed Patient not taking: Reported on 02/15/2020 04/25/19   Megan Salon, MD    Past Medical History:  Diagnosis Date  . Allergy   . Anemia  hx  . GERD (gastroesophageal reflux disease)   . History of abnormal cervical Pap smear   . HSV infection    h/o  . HTN (hypertension)   . Malignant neoplasm of upper-inner quadrant of left breast in female, estrogen receptor positive ([place]) 04/22/2017  . Ovarian cyst    h/o  . Perennial allergic rhinitis    [place]   . Seasonal allergic rhinitis    [place]    Past Surgical History:  Procedure Laterality Date  . ADENOIDECTOMY     only adenoids still have tonsils  . BREAST LUMPECTOMY WITH RADIOACTIVE SEED AND SENTINEL LYMPH NODE BIOPSY Left 05/17/2017   Procedure: LEFT BREAST LUMPECTOMY WITH RADIOACTIVE SEED AND SENTINEL LYMPH NODE BIOPSY;  Surgeon: Jovita Kussmaul, MD;  Location: [facility];  Service:  General;  Laterality: Left;  . CESAREAN SECTION     x3  . excision of precancerous mole     lft arm, chest    Social History   Tobacco Use  . Smoking status: Never Smoker  . Smokeless tobacco: Never Used  Substance Use Topics  . Alcohol use: Yes    Alcohol/week: 2.0 standard drinks    Types: 2 Standard drinks or equivalent per week    Family History  Problem Relation Age of Onset  . Mitral valve prolapse Mother   . Prostate cancer Father   . Transient ischemic attack Father   . Breast cancer Paternal Aunt 13       bile duct cancer  . Colon cancer Neg Hx   . Rectal cancer Neg Hx   . Stomach cancer Neg Hx   . Esophageal cancer Neg Hx     Review of Systems  Constitutional: Negative for chills and fever.  Respiratory: Negative for cough and shortness of breath.   Cardiovascular: Negative for chest pain, palpitations and leg swelling.  Gastrointestinal: Negative for abdominal pain, nausea and vomiting.     OBJECTIVE:  Today's Vitals   02/15/20 0945  BP: (!) 151/76  Pulse: 92  Resp: 16  Temp: 98 F (36.7 C)  TempSrc: Temporal  SpO2: 98%  Weight: 180 lb (81.6 kg)  Height: 5' 5.5" (1.664 m)   Body mass index is 29.5 kg/m.   BP Readings from Last 3 Encounters:  02/15/20 (!) 151/76  01/29/20 (!) 151/78  07/11/19 (!) 150/86    Wt Readings from Last 3 Encounters:  02/15/20 180 lb (81.6 kg)  01/29/20 177 lb 12.8 oz (80.6 kg)  07/07/19 179 lb (81.2 kg)    Physical Exam Vitals and nursing note reviewed.  Constitutional:      Appearance: She is well-developed.  HENT:     Head: Normocephalic and atraumatic.     Mouth/Throat:     Pharynx: No oropharyngeal exudate.  Eyes:     General: No scleral icterus.    Conjunctiva/sclera: Conjunctivae normal.     Pupils: Pupils are equal, round, and reactive to light.  Cardiovascular:     Rate and Rhythm: Normal rate and regular rhythm.     Heart sounds: Normal heart sounds. No murmur. No friction rub. No gallop.     Pulmonary:     Effort: Pulmonary effort is normal.     Breath sounds: Normal breath sounds. No wheezing or rales.  Musculoskeletal:     Cervical back: Neck supple.  Skin:    General: Skin is warm and dry.  Neurological:     Mental Status: She is alert and oriented to person, place, and time.  No results found for this or any previous visit (from the past 24 hour(s)).  No results found.   ASSESSMENT and PLAN  1. Essential hypertension Uncontrolled. Cont losartan 100mg , amlodipine 5mg , adding hctz 12.5mg , cont home BP monitoring, cont working on LFM - Lipid panel - amLODipine (NORVASC) 5 MG tablet; Take 1 tablet (5 mg total) by mouth daily.  2. Prediabetes Labs pending. Cont working on LFM - Hemoglobin A1c  Other orders - losartan-hydrochlorothiazide (HYZAAR) 100-12.5 MG tablet; Take 1 tablet by mouth daily.  Return in about 6 months (around 08/16/2020).    Rutherford Guys, MD [facility] [place], [place] [address] Ph.  [phone] Fax [phone]

## 2020-02-15 NOTE — Patient Instructions (Addendum)
   If you have lab work done today you will be contacted with your lab results within the next 2 weeks.  If you have not heard from us then please contact us. The fastest way to get your results is to register for My Chart.   IF you received an x-ray today, you will receive an invoice from [facility]. Please contact [facility] at [phone] with questions or concerns regarding your invoice.   IF you received labwork today, you will receive an invoice from [facility]. Please contact [facility] at [phone] with questions or concerns regarding your invoice.   Our billing staff will not be able to assist you with questions regarding bills from these companies.  You will be contacted with the lab results as soon as they are available. The fastest way to get your results is to activate your My Chart account. Instructions are located on the last page of this paperwork. If you have not heard from us regarding the results in 2 weeks, please contact this office.     DASH Eating Plan DASH stands for "Dietary Approaches to Stop Hypertension." The DASH eating plan is a healthy eating plan that has been shown to reduce high blood pressure (hypertension). It may also reduce your risk for type 2 diabetes, heart disease, and stroke. The DASH eating plan may also help with weight loss. What are tips for following this plan?  General guidelines  Avoid eating more than 2,300 mg (milligrams) of salt (sodium) a day. If you have hypertension, you may need to reduce your sodium intake to 1,500 mg a day.  Limit alcohol intake to no more than 1 drink a day for nonpregnant women and 2 drinks a day for men. One drink equals 12 oz of beer, 5 oz of wine, or 1 oz of hard liquor.  Work with your health care provider to maintain a healthy body weight or to lose weight. Ask what an ideal weight is for you.  Get at least 30 minutes of exercise that causes your heart to beat faster (aerobic  exercise) most days of the week. Activities may include walking, swimming, or biking.  Work with your health care provider or diet and nutrition specialist (dietitian) to adjust your eating plan to your individual calorie needs. Reading food labels   Check food labels for the amount of sodium per serving. Choose foods with less than 5 percent of the Daily Value of sodium. Generally, foods with less than 300 mg of sodium per serving fit into this eating plan.  To find whole grains, look for the word "whole" as the first word in the ingredient list. Shopping  Buy products labeled as "low-sodium" or "no salt added."  Buy fresh foods. Avoid canned foods and premade or frozen meals. Cooking  Avoid adding salt when cooking. Use salt-free seasonings or herbs instead of table salt or sea salt. Check with your health care provider or pharmacist before using salt substitutes.  Do not fry foods. Cook foods using healthy methods such as baking, boiling, grilling, and broiling instead.  Cook with heart-healthy oils, such as olive, canola, soybean, or sunflower oil. Meal planning  Eat a balanced diet that includes: ? 5 or more servings of fruits and vegetables each day. At each meal, try to fill half of your plate with fruits and vegetables. ? Up to 6-8 servings of whole grains each day. ? Less than 6 oz of lean meat, poultry, or fish each day. A 3-oz serving   of meat is about the same size as a deck of cards. One egg equals 1 oz. ? 2 servings of low-fat dairy each day. ? A serving of nuts, seeds, or beans 5 times each week. ? Heart-healthy fats. Healthy fats called Omega-3 fatty acids are found in foods such as flaxseeds and coldwater fish, like sardines, salmon, and mackerel.  Limit how much you eat of the following: ? Canned or prepackaged foods. ? Food that is high in trans fat, such as fried foods. ? Food that is high in saturated fat, such as fatty meat. ? Sweets, desserts, sugary drinks,  and other foods with added sugar. ? Full-fat dairy products.  Do not salt foods before eating.  Try to eat at least 2 vegetarian meals each week.  Eat more home-cooked food and less restaurant, buffet, and fast food.  When eating at a restaurant, ask that your food be prepared with less salt or no salt, if possible. What foods are recommended? The items listed may not be a complete list. Talk with your dietitian about what dietary choices are best for you. Grains Whole-grain or whole-wheat bread. Whole-grain or whole-wheat pasta. Brown rice. Oatmeal. Quinoa. Bulgur. Whole-grain and low-sodium cereals. Pita bread. Low-fat, low-sodium crackers. Whole-wheat flour tortillas. Vegetables Fresh or frozen vegetables (raw, steamed, roasted, or grilled). Low-sodium or reduced-sodium tomato and vegetable juice. Low-sodium or reduced-sodium tomato sauce and tomato paste. Low-sodium or reduced-sodium canned vegetables. Fruits All fresh, dried, or frozen fruit. Canned fruit in natural juice (without added sugar). Meat and other protein foods Skinless chicken or turkey. Ground chicken or turkey. Pork with fat trimmed off. Fish and seafood. Egg whites. Dried beans, peas, or lentils. Unsalted nuts, nut butters, and seeds. Unsalted canned beans. Lean cuts of beef with fat trimmed off. Low-sodium, lean deli meat. Dairy Low-fat (1%) or fat-free (skim) milk. Fat-free, low-fat, or reduced-fat cheeses. Nonfat, low-sodium ricotta or cottage cheese. Low-fat or nonfat yogurt. Low-fat, low-sodium cheese. Fats and oils Soft margarine without trans fats. Vegetable oil. Low-fat, reduced-fat, or light mayonnaise and salad dressings (reduced-sodium). Canola, safflower, olive, soybean, and sunflower oils. Avocado. Seasoning and other foods Herbs. Spices. Seasoning mixes without salt. Unsalted popcorn and pretzels. Fat-free sweets. What foods are not recommended? The items listed may not be a complete list. Talk with your  dietitian about what dietary choices are best for you. Grains Baked goods made with fat, such as croissants, muffins, or some breads. Dry pasta or rice meal packs. Vegetables Creamed or fried vegetables. Vegetables in a cheese sauce. Regular canned vegetables (not low-sodium or reduced-sodium). Regular canned tomato sauce and paste (not low-sodium or reduced-sodium). Regular tomato and vegetable juice (not low-sodium or reduced-sodium). Pickles. Olives. Fruits Canned fruit in a light or heavy syrup. Fried fruit. Fruit in cream or butter sauce. Meat and other protein foods Fatty cuts of meat. Ribs. Fried meat. Bacon. Sausage. Bologna and other processed lunch meats. Salami. Fatback. Hotdogs. Bratwurst. Salted nuts and seeds. Canned beans with added salt. Canned or smoked fish. Whole eggs or egg yolks. Chicken or turkey with skin. Dairy Whole or 2% milk, cream, and half-and-half. Whole or full-fat cream cheese. Whole-fat or sweetened yogurt. Full-fat cheese. Nondairy creamers. Whipped toppings. Processed cheese and cheese spreads. Fats and oils Butter. Stick margarine. Lard. Shortening. Ghee. Bacon fat. Tropical oils, such as coconut, palm kernel, or palm oil. Seasoning and other foods Salted popcorn and pretzels. Onion salt, garlic salt, seasoned salt, table salt, and sea salt. Worcestershire sauce. Tartar sauce. Barbecue sauce.   Teriyaki sauce. Soy sauce, including reduced-sodium. Steak sauce. Canned and packaged gravies. Fish sauce. Oyster sauce. Cocktail sauce. Horseradish that you find on the shelf. Ketchup. Mustard. Meat flavorings and tenderizers. Bouillon cubes. Hot sauce and Tabasco sauce. Premade or packaged marinades. Premade or packaged taco seasonings. Relishes. Regular salad dressings. Where to find more information:  National Heart, Lung, and Blood Institute: www.nhlbi.nih.gov  American Heart Association: www.heart.org Summary  The DASH eating plan is a healthy eating plan that has  been shown to reduce high blood pressure (hypertension). It may also reduce your risk for type 2 diabetes, heart disease, and stroke.  With the DASH eating plan, you should limit salt (sodium) intake to 2,300 mg a day. If you have hypertension, you may need to reduce your sodium intake to 1,500 mg a day.  When on the DASH eating plan, aim to eat more fresh fruits and vegetables, whole grains, lean proteins, low-fat dairy, and heart-healthy fats.  Work with your health care provider or diet and nutrition specialist (dietitian) to adjust your eating plan to your individual calorie needs. This information is not intended to replace advice given to you by your health care provider. Make sure you discuss any questions you have with your health care provider. Document Revised: 10/15/2017 Document Reviewed: 10/26/2016 Elsevier Patient Education  2020 Elsevier Inc.  

## 2020-05-12 ENCOUNTER — Other Ambulatory Visit: Payer: Self-pay | Admitting: Nurse Practitioner

## 2020-05-12 DIAGNOSIS — Z170 Estrogen receptor positive status [ER+]: Secondary | ICD-10-CM

## 2020-05-16 ENCOUNTER — Other Ambulatory Visit: Payer: Self-pay

## 2020-05-16 ENCOUNTER — Encounter: Payer: Self-pay | Admitting: Orthopaedic Surgery

## 2020-05-16 ENCOUNTER — Ambulatory Visit: Payer: Self-pay

## 2020-05-16 ENCOUNTER — Ambulatory Visit (INDEPENDENT_AMBULATORY_CARE_PROVIDER_SITE_OTHER): Payer: BC Managed Care – PPO | Admitting: Orthopaedic Surgery

## 2020-05-16 DIAGNOSIS — M7661 Achilles tendinitis, right leg: Secondary | ICD-10-CM | POA: Diagnosis not present

## 2020-05-16 NOTE — Progress Notes (Signed)
Office Visit Note   Patient: Kristina Sanders           Date of Birth: 16-Oct-1961           MRN: [id number] Visit Date: 05/16/2020              Requested by: Rutherford Guys, MD [address] [place],  [place] [address] PCP: Rutherford Guys, MD   Assessment & Plan: Visit Diagnoses:  1. Achilles tendinitis, right leg     Plan: Insertional Achilles tendinitis right heel.  Long discussion regarding diagnoses and treatment options.  She has had a full course of treatment through [facility] including physical therapy.  I think it is worth providing her with a felt heel pad to unload the Achilles.  Also think it is worth obtaining an MRI scan.  Have discussed surgical options with her.  We will see her back after the scan Further discussion regarding casting, equalizer boot and continuation of Pennsaid or Advil.  Follow-Up Instructions: No follow-ups on file.   Orders:  Orders Placed This Encounter  Procedures  . XR Ankle Complete Right   No orders of the defined types were placed in this encounter.     Procedures: No procedures performed   Clinical Data: No additional findings.   Subjective: Chief Complaint  Patient presents with  . Left Knee - Pain  . Right Ankle - Pain  Kristina Sanders been experiencing pain in the distal aspect of her right Achilles for over a year.  She denies any history of injury or trauma.  She has been evaluated and treated at [facility].  She is tried Pennsaid and Nitro-Dur without relief.  Advil provides some relief.  She has been through a course of physical therapy and feels like she has better range of motion but she is not sure that symptomatically she is much improved.  She is not having any numbness or tingling.  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  Physical therapy had suggested good sneakers and she has been trying to wear comfortable shoes.  Shoes with an closed heel have been more of a problem.  She is just  frustrated because it seems interfere with her activities particularly walking. Secondarily she has developed some discomfort along the lateral aspect of her right hip the longer she is on her feet and also some "stiffness" in her left knee that she attributes to a limp related to her right Achilles  HPI  Review of Systems   Objective: Vital Signs: LMP 07/17/2013 Comment: had slight spotting post insertion, IUD removed in 03/2017, blood  work confirmed- menopause  Physical Exam Constitutional:      Appearance: She is well-developed.  Eyes:     Pupils: Pupils are equal, round, and reactive to light.  Pulmonary:     Effort: Pulmonary effort is normal.  Skin:    General: Skin is warm and dry.  Neurological:     Mental Status: She is alert and oriented to person, place, and time.  Psychiatric:        Behavior: Behavior normal.     Ortho Exam awake alert and oriented x3.  Comfortable sitting.  Examination of the right heel there is some tenderness directly at the insertion of the Achilles tendon at the os calcis..  Not much of a pump bump.  Neurologically intact.  No pain in the subtalar area.  Good pulses.  No edema.  No pain along  the Achilles tendon or gastrocsoleus tendon insertion. Painless range of motion of both hips.  No pain over the lateral aspect of either hip.  No knee effusion.  Neither knee was hot red warm or swollen.  Specialty Comments:  No specialty comments available.  Imaging: No results found.   PMFS History: Patient Active Problem List   Diagnosis Date Noted  . Achilles tendinitis, right leg 05/16/2020  . Prediabetes 06/01/2018  . Malignant neoplasm of upper-inner quadrant of left breast in female, estrogen receptor positive ([place]) 04/22/2017  . Allergic rhinitis due to other allergen 10/08/2012  . HTN (hypertension)    Past Medical History:  Diagnosis Date  . Allergy   . Anemia    hx  . GERD (gastroesophageal reflux disease)   . History of abnormal  cervical Pap smear   . HSV infection    h/o  . HTN (hypertension)   . Malignant neoplasm of upper-inner quadrant of left breast in female, estrogen receptor positive ([place]) 04/22/2017  . Ovarian cyst    h/o  . Perennial allergic rhinitis    [place]   . Seasonal allergic rhinitis    [place]    Family History  Problem Relation Age of Onset  . Mitral valve prolapse Mother   . Prostate cancer Father   . Transient ischemic attack Father   . Breast cancer Paternal Aunt 43       bile duct cancer  . Colon cancer Neg Hx   . Rectal cancer Neg Hx   . Stomach cancer Neg Hx   . Esophageal cancer Neg Hx     Past Surgical History:  Procedure Laterality Date  . ADENOIDECTOMY     only adenoids still have tonsils  . BREAST LUMPECTOMY WITH RADIOACTIVE SEED AND SENTINEL LYMPH NODE BIOPSY Left 05/17/2017   Procedure: LEFT BREAST LUMPECTOMY WITH RADIOACTIVE SEED AND SENTINEL LYMPH NODE BIOPSY;  Surgeon: Jovita Kussmaul, MD;  Location: [facility];  Service: General;  Laterality: Left;  . CESAREAN SECTION     x3  . excision of precancerous mole     lft arm, chest   Social History   Occupational History  . Not on file  Tobacco Use  . Smoking status: Never Smoker  . Smokeless tobacco: Never Used  Vaping Use  . Vaping Use: Never used  Substance and Sexual Activity  . Alcohol use: Yes    Alcohol/week: 2.0 standard drinks    Types: 2 Standard drinks or equivalent per week  . Drug use: No  . Sexual activity: Yes    Partners: Male    Birth control/protection: Other-see comments    Comment: vasectomy     Kristina Balding, MD   Note - This record has been created using [employer].  Chart creation errors have been sought, but may not always  have been located. Such creation errors do not reflect on  the standard of medical care.

## 2020-06-06 ENCOUNTER — Ambulatory Visit
Admission: RE | Admit: 2020-06-06 | Discharge: 2020-06-06 | Disposition: A | Discharge status: Home / Self Care | Payer: BC Managed Care – PPO | Source: Ambulatory Visit | Attending: Orthopaedic Surgery | Admitting: Orthopaedic Surgery

## 2020-06-06 DIAGNOSIS — M7661 Achilles tendinitis, right leg: Secondary | ICD-10-CM

## 2020-06-11 ENCOUNTER — Ambulatory Visit: Payer: BC Managed Care – PPO | Admitting: Orthopaedic Surgery

## 2020-06-11 ENCOUNTER — Encounter: Payer: Self-pay | Admitting: Orthopaedic Surgery

## 2020-06-11 ENCOUNTER — Other Ambulatory Visit: Payer: Self-pay

## 2020-06-11 DIAGNOSIS — M7661 Achilles tendinitis, right leg: Secondary | ICD-10-CM | POA: Diagnosis not present

## 2020-06-11 DIAGNOSIS — M7751 Other enthesopathy of right foot: Secondary | ICD-10-CM | POA: Diagnosis not present

## 2020-06-11 DIAGNOSIS — M7750 Other enthesopathy of unspecified foot: Secondary | ICD-10-CM | POA: Insufficient documentation

## 2020-06-11 MED ORDER — METHYLPREDNISOLONE ACETATE 40 MG/ML IJ SUSP
30.0000 mg | INTRAMUSCULAR | Status: AC | PRN
  Administered 2020-06-11: 30 mg via INTRA_ARTICULAR

## 2020-06-11 MED ORDER — LIDOCAINE HCL 1 % IJ SOLN
1.0000 mL | INTRAMUSCULAR | Status: AC | PRN
  Administered 2020-06-11: 1 mL

## 2020-06-11 MED ORDER — BUPIVACAINE HCL 0.25 % IJ SOLN
0.6600 mL | INTRAMUSCULAR | Status: AC | PRN
  Administered 2020-06-11: .66 mL via INTRA_ARTICULAR

## 2020-06-11 NOTE — Progress Notes (Signed)
Office Visit Note   Patient: Kristina Sanders           Date of Birth: 1961/05/13           MRN: [id number] Visit Date: 06/11/2020              Requested by: Kristina Guys, MD [address] [place],  [place] [address] PCP: Kristina Guys, MD   Assessment & Plan: Visit Diagnoses:  1. Achilles tendinitis, right leg   2. Postcalcaneal bursitis of right foot     Plan:  #1: Corticosteroid injection was placed into the retrocalcaneal bursa right foot/ankle #2: If this is not beneficial then we will need to reevaluate her.  Follow-Up Instructions: No follow-ups on file.   Orders:  No orders of the defined types were placed in this encounter.  No orders of the defined types were placed in this encounter.     Procedures: Medium Joint Inj: R ankle on 06/11/2020 3:10 PM Indications: pain Details: 25 G 1.5 in needle, medial approach Medications: 1 mL lidocaine 1 %; 0.66 mL bupivacaine 0.25 %; 30 mg methylPREDNISolone acetate 40 MG/ML  Performed by Kristina Sanders Procedure, treatment alternatives, risks and benefits explained, specific risks discussed. Consent was given by the patient. Immediately prior to procedure a time out was called to verify the correct patient, procedure, equipment, support staff and site/side marked as required. Patient was prepped and draped in the usual sterile fashion.       Clinical Data: No additional findings.   Subjective: Chief Complaint  Patient presents with  . Right Ankle - Pain    HPI  Kristina Sanders is a 59 year old white female who is seen today for review of an MRI scan of her right foot and heel.  She had denied any history of injury or trauma but was recently evaluated noted to have pain in the posterior aspect of the foot at the Achilles and calcaneus.  She had tried Pennsaid as well as Nitro-Dur which had no relief.  Advil was only somewhat beneficial.  She had a course of physical therapy but this was mainly strengthening exercises.  No  modalities were noted.  She has tried appropriate shoe choices for her symptoms.  Because of her frustration an MRI scan was ordered and she is seen today for review of the MRI scan.  Review of Systems  Constitutional: Negative.   HENT: Negative.   Respiratory: Negative.   Cardiovascular: Negative.   Gastrointestinal: Negative.      Objective: Vital Signs: LMP 07/17/2013 Comment: had slight spotting post insertion, IUD removed in 03/2017, blood  work confirmed- menopause  Physical Exam Constitutional:      Appearance: She is well-developed.  Eyes:     Pupils: Pupils are equal, round, and reactive to light.  Pulmonary:     Effort: Pulmonary effort is normal.  Skin:    General: Skin is warm and dry.  Neurological:     Mental Status: She is alert and oriented to person, place, and time.  Psychiatric:        Behavior: Behavior normal.     Ortho Exam  Exam today reveals some tenderness to palpation over the insertion of the Achilles tendon on the calcaneus.  Little bit of a pump bump but this is minimal at best.  No swelling is noted.  No particular painful along the Achilles tendon either.   Specialty Comments:  No specialty comments available.  Imaging:  MR HEEL RIGHT WO CONTRAST  Result Date: 06/08/2020 CLINICAL DATA:  Posterior ankle/heel pain. EXAM: MR OF THE RIGHT HEEL WITHOUT CONTRAST TECHNIQUE: Multiplanar, multisequence MR imaging of the ankle was performed. No intravenous contrast was administered. COMPARISON:  Radiographs 05/16/2020 FINDINGS: TENDONS Peroneal: Mild tendinopathy and tenosynovitis mainly involving the peroneus brevis tendon. No complete tear/rupture. Posteromedial: Mild tenosynovitis but no significant tendinopathy or tear. Anterior: Intact. No significant tendinopathy or tenosynovitis. Achilles: Diffuse thickening and slight increased T2 signal intensity consistent with chronic Achilles tendinopathy. Distally there is interstitial tears and a small  partial-thickness tear involving the deep attachment fibers. No complete tear/rupture. There is also associated complex retrocalcaneal bursitis with some surrounding inflammatory changes. Associated mild reactive marrow edema involving the calcaneus at the attachment site. No calcaneal stress fracture. Plantar Fascia: Calcaneal heel spurs noted along with mild changes of plantar fasciitis. Mild reactive changes in the calcaneus. LIGAMENTS Lateral: Intact Medial: Intact CARTILAGE Ankle Joint: No significant degenerative changes. No cartilage defects or osteochondral lesion. No joint effusion. Subtalar Joints/Sinus Tarsi: The subtalar joints are maintained. Minimal degenerative changes. The sinus tarsi is normal. The cervical and interosseous ligaments are intact. The spring ligament is intact. Bones: Mild reactive marrow edema noted at the attachment of the Achilles tendon and plantar fascia but no calcaneal stress fracture. The visualized midfoot bony structures are intact. Other: Unremarkable foot and ankle musculature. IMPRESSION: 1. Changes of chronic Achilles tendinopathy. Distally there are interstitial tears and a small partial-thickness tear involving the deep attachment fibers. No complete tear/rupture. Associated complex retrocalcaneal bursitis with some surrounding inflammatory changes. 2. Mild tendinopathy and tenosynovitis involving the peroneus brevis tendon. 3. Intact medial and lateral ankle ligaments and tendons. 4. Calcaneal heel spurs along with mild changes of plantar fasciitis. Electronically Signed   By: Kristina Sanders M.D.   On: 06/08/2020 09:50      PMFS History: Current Outpatient Medications  Medication Sig Dispense Refill  . acetaminophen (TYLENOL) 500 MG tablet Take 1,000 mg by mouth every 6 (six) hours as needed for mild pain.    Marland Kitchen amLODipine (NORVASC) 5 MG tablet Take 1 tablet (5 mg total) by mouth daily. 90 tablet 1  . anastrozole (ARIMIDEX) 1 MG tablet TAKE 1 TABLET(1 MG) BY  MOUTH DAILY 90 tablet 3  . famotidine (PEPCID) 20 MG tablet Take 20 mg by mouth daily as needed for heartburn or indigestion.    . fexofenadine (ALLEGRA) 180 MG tablet Take 180 mg by mouth as needed.     . fluticasone (FLONASE) 50 MCG/ACT nasal spray Place 2 sprays into both nostrils daily. 16 g 6  . losartan-hydrochlorothiazide (HYZAAR) 100-12.5 MG tablet Take 1 tablet by mouth daily. 90 tablet 1  . nitroGLYCERIN (NITRODUR - DOSED IN MG/24 HR) 0.2 mg/hr patch Place 1 patch onto the skin every morning.    . valACYclovir (VALTREX) 500 MG tablet Take by mouth.     No current facility-administered medications for this visit.    Patient Active Problem List   Diagnosis Date Noted  . Retrocalcaneal bursitis 06/11/2020  . Achilles tendinitis, right leg 05/16/2020  . Prediabetes 06/01/2018  . Malignant neoplasm of upper-inner quadrant of left breast in female, estrogen receptor positive ([place]) 04/22/2017  . Allergic rhinitis due to other allergen 10/08/2012  . HTN (hypertension)    Past Medical History:  Diagnosis Date  . Allergy   . Anemia    hx  . GERD (gastroesophageal reflux disease)   . History of abnormal cervical Pap smear   . HSV infection  h/o  . HTN (hypertension)   . Malignant neoplasm of upper-inner quadrant of left breast in female, estrogen receptor positive ([place]) 04/22/2017  . Ovarian cyst    h/o  . Perennial allergic rhinitis    [place]   . Seasonal allergic rhinitis    [place]    Family History  Problem Relation Age of Onset  . Mitral valve prolapse Mother   . Prostate cancer Father   . Transient ischemic attack Father   . Breast cancer Paternal Aunt 75       bile duct cancer  . Colon cancer Neg Hx   . Rectal cancer Neg Hx   . Stomach cancer Neg Hx   . Esophageal cancer Neg Hx     Past Surgical History:  Procedure Laterality Date  . ADENOIDECTOMY     only adenoids still have tonsils  . BREAST LUMPECTOMY WITH RADIOACTIVE SEED AND SENTINEL LYMPH NODE BIOPSY  Left 05/17/2017   Procedure: LEFT BREAST LUMPECTOMY WITH RADIOACTIVE SEED AND SENTINEL LYMPH NODE BIOPSY;  Surgeon: Jovita Kussmaul, MD;  Location: [facility];  Service: General;  Laterality: Left;  . CESAREAN SECTION     x3  . excision of precancerous mole     lft arm, chest   Social History   Occupational History  . Not on file  Tobacco Use  . Smoking status: Never Smoker  . Smokeless tobacco: Never Used  Vaping Use  . Vaping Use: Never used  Substance and Sexual Activity  . Alcohol use: Yes    Alcohol/week: 2.0 standard drinks    Types: 2 Standard drinks or equivalent per week  . Drug use: No  . Sexual activity: Yes    Partners: Male    Birth control/protection: Other-see comments    Comment: vasectomy

## 2020-08-11 ENCOUNTER — Other Ambulatory Visit: Payer: Self-pay | Admitting: Family Medicine

## 2020-08-11 NOTE — Telephone Encounter (Signed)
Requested Prescriptions  Pending Prescriptions Disp Refills  . losartan-hydrochlorothiazide (HYZAAR) 100-12.5 MG tablet [Pharmacy Med Name: LOSARTAN/HCTZ 100/12.5MG  TABLETS] 90 tablet 0    Sig: TAKE 1 TABLET BY MOUTH DAILY     Cardiovascular: ARB + Diuretic Combos Failed - 08/11/2020  3:15 PM      Failed - K in normal range and within 180 days    Potassium  Date Value Ref Range Status  01/29/2020 3.7 3.5 - 5.1 mmol/L Final  04/28/2017 3.7 3.5 - 5.1 mEq/L Final         Failed - Na in normal range and within 180 days    Sodium  Date Value Ref Range Status  01/29/2020 139 135 - 145 mmol/L Final  06/01/2018 138 134 - 144 mmol/L Final  04/28/2017 140 136 - 145 mEq/L Final         Failed - Cr in normal range and within 180 days    Creatinine  Date Value Ref Range Status  04/28/2017 0.7 0.6 - 1.1 mg/dL Final   Creatinine, Ser  Date Value Ref Range Status  01/29/2020 0.72 0.44 - 1.00 mg/dL Final         Failed - Ca in normal range and within 180 days    Calcium  Date Value Ref Range Status  01/29/2020 9.9 8.9 - 10.3 mg/dL Final  04/28/2017 9.7 8.4 - 10.4 mg/dL Final   Calcium, Ion  Date Value Ref Range Status  07/29/2014 1.10 (L) 1.12 - 1.23 mmol/L Final         Failed - Last BP in normal range    BP Readings from Last 1 Encounters:  02/15/20 (!) 151/76         Passed - Patient is not pregnant      Passed - Valid encounter within last 6 months    Recent Outpatient Visits          5 months ago Essential hypertension   [facility], Lilia Argue, MD   1 year ago Prediabetes   [facility], Lilia Argue, MD   1 year ago Neck pain on left side   [facility], Lilia Argue, MD   2 years ago Essential hypertension   [facility], [place] D, PA-C   2 years ago Cough   [facility], Reather Laurence, PA-C      Future Appointments            In 5 days Rutherford Guys, MD [facility] [place], [facility]

## 2020-08-16 ENCOUNTER — Other Ambulatory Visit: Payer: Self-pay

## 2020-08-16 ENCOUNTER — Encounter: Payer: Self-pay | Admitting: Family Medicine

## 2020-08-16 ENCOUNTER — Ambulatory Visit: Payer: BC Managed Care – PPO | Admitting: Family Medicine

## 2020-08-16 VITALS — BP 137/82 | HR 78 | Temp 98.2°F | Resp 16 | Ht 65.5 in | Wt 183.0 lb

## 2020-08-16 DIAGNOSIS — I10 Essential (primary) hypertension: Secondary | ICD-10-CM | POA: Diagnosis not present

## 2020-08-16 DIAGNOSIS — R7303 Prediabetes: Secondary | ICD-10-CM

## 2020-08-16 DIAGNOSIS — R252 Cramp and spasm: Secondary | ICD-10-CM

## 2020-08-16 DIAGNOSIS — Z23 Encounter for immunization: Secondary | ICD-10-CM

## 2020-08-16 LAB — HEMOGLOBIN A1C
Est. average glucose Bld gHb Est-mCnc: 111 mg/dL
Hgb A1c MFr Bld: 5.5 % (ref 4.8–5.6)

## 2020-08-16 LAB — COMPREHENSIVE METABOLIC PANEL
ALT: 21 IU/L (ref 0–32)
AST: 20 IU/L (ref 0–40)
Albumin/Globulin Ratio: 2.4 — ABNORMAL HIGH (ref 1.2–2.2)
Albumin: 4.8 g/dL (ref 3.8–4.9)
Alkaline Phosphatase: 69 IU/L (ref 44–121)
BUN/Creatinine Ratio: 20 (ref 9–23)
BUN: 11 mg/dL (ref 6–24)
Bilirubin Total: 0.7 mg/dL (ref 0.0–1.2)
CO2: 24 mmol/L (ref 20–29)
Calcium: 9.4 mg/dL (ref 8.7–10.2)
Chloride: 100 mmol/L (ref 96–106)
Creatinine, Ser: 0.56 mg/dL — ABNORMAL LOW (ref 0.57–1.00)
GFR calc Af Amer: 118 mL/min/{1.73_m2} (ref >59)
GFR calc non Af Amer: 102 mL/min/{1.73_m2} (ref >59)
Globulin, Total: 2 g/dL (ref 1.5–4.5)
Glucose: 106 mg/dL — ABNORMAL HIGH (ref 65–99)
Potassium: 4.1 mmol/L (ref 3.5–5.2)
Sodium: 136 mmol/L (ref 134–144)
Total Protein: 6.8 g/dL (ref 6.0–8.5)

## 2020-08-16 LAB — LIPID PANEL
Chol/HDL Ratio: 4.3 ratio (ref 0.0–4.4)
Cholesterol, Total: 229 mg/dL — ABNORMAL HIGH (ref 100–199)
HDL: 53 mg/dL (ref >39)
LDL Chol Calc (NIH): 160 mg/dL — ABNORMAL HIGH (ref 0–99)
Triglycerides: 93 mg/dL (ref 0–149)
VLDL Cholesterol Cal: 16 mg/dL (ref 5–40)

## 2020-08-16 MED ORDER — LOSARTAN POTASSIUM-HCTZ 100-12.5 MG PO TABS: 1.0000 | ORAL_TABLET | Freq: Every day | ORAL | 1 refills | Status: DC

## 2020-08-16 MED ORDER — AMLODIPINE BESYLATE 5 MG PO TABS: 5.0000 mg | ORAL_TABLET | Freq: Every day | ORAL | 1 refills | Status: DC

## 2020-08-16 NOTE — Progress Notes (Signed)
10/1/20219:25 AM  Kristina Sanders Apr 11, 1961, 59 y.o., female [id number]  Chief Complaint  Patient presents with  . Hypertension    pt notes she took her BP consistently after starting new medication and then slowly stopped as the medication seemed to work.   . Prediabetes    last A1c was within range at 5.5 pt has remained consistenet with her diet   . Immunizations    pt would like to discuss Shingles and Influenza, but is concerend about getting them the same time and she has a cold sore and wasnt sure if her having that and taking valcyclovir would affect the Shingles shot     HPI:   Patient is a 59 y.o. female with past medical history significant for HTN, prediabetes, left breast cancer who presents today for routine followup  Last OV April 2021 - added hctz 12.5mg   She is overall doing well She is tolerating meds well Having some cramping of left foot, h/o achilles tendonitis, not doing her exercises as much anymore She continues to work on her diet, limited exercise due to foot pain  Lab Results  Component Value Date   HGBA1C 5.5 02/15/2020   HGBA1C 5.4 06/01/2018   HGBA1C 5.7 (H) 10/22/2015   Lab Results  Component Value Date   LDLCALC 151 (H) 02/15/2020   CREATININE 0.72 01/29/2020   Lab Results  Component Value Date   CREATININE 0.72 01/29/2020   BUN 14 01/29/2020   NA 139 01/29/2020   K 3.7 01/29/2020   CL 102 01/29/2020   CO2 26 01/29/2020   Wt Readings from Last 3 Encounters:  08/16/20 183 lb (83 kg)  02/15/20 180 lb (81.6 kg)  01/29/20 177 lb 12.8 oz (80.6 kg)   BP Readings from Last 3 Encounters:  08/16/20 137/82  02/15/20 (!) 151/76  01/29/20 (!) 151/78    Depression screen PHQ 2/9 08/16/2020 02/15/2020 07/11/2019  Decreased Interest 0 0 0  Down, Depressed, Hopeless 0 0 0  PHQ - 2 Score 0 0 0    Fall Risk  08/16/2020 02/15/2020 07/11/2019 07/11/2019 03/27/2019  Falls in the past year? 0 0 0 0 0  Number falls in past yr: 0 - 0 0 0  Injury  with Fall? 0 - 0 0 0  Risk for fall due to : No Fall Risks - - - -  Follow up Falls evaluation completed Falls evaluation completed - - -     No Known Allergies  Prior to Admission medications   Medication Sig Start Date End Date Taking? Authorizing Provider  acetaminophen (TYLENOL) 500 MG tablet Take 1,000 mg by mouth every 6 (six) hours as needed for mild pain.    [provider]  amLODipine (NORVASC) 5 MG tablet Take 1 tablet (5 mg total) by mouth daily. 02/15/20   Rutherford Guys, MD  anastrozole (ARIMIDEX) 1 MG tablet TAKE 1 TABLET(1 MG) BY MOUTH DAILY 05/13/20   Alla Feeling, NP  famotidine (PEPCID) 20 MG tablet Take 20 mg by mouth daily as needed for heartburn or indigestion.    [provider]  fexofenadine (ALLEGRA) 180 MG tablet Take 180 mg by mouth as needed.     [provider]  fluticasone (FLONASE) 50 MCG/ACT nasal spray Place 2 sprays into both nostrils daily. 05/06/17   McVey, Gelene Mink, PA-C  losartan-hydrochlorothiazide (HYZAAR) 100-12.5 MG tablet TAKE 1 TABLET BY MOUTH DAILY 08/11/20   Rutherford Guys, MD  nitroGLYCERIN (NITRODUR - DOSED IN MG/24  HR) 0.2 mg/hr patch Place 1 patch onto the skin every morning.    [provider]  valACYclovir (VALTREX) 500 MG tablet Take by mouth. 06/10/20   [provider]    Past Medical History:  Diagnosis Date  . Allergy   . Anemia    hx  . GERD (gastroesophageal reflux disease)   . History of abnormal cervical Pap smear   . HSV infection    h/o  . HTN (hypertension)   . Malignant neoplasm of upper-inner quadrant of left breast in female, estrogen receptor positive ([place]) 04/22/2017  . Ovarian cyst    h/o  . Perennial allergic rhinitis    [place]   . Seasonal allergic rhinitis    [place]    Past Surgical History:  Procedure Laterality Date  . ADENOIDECTOMY     only adenoids still have tonsils  . BREAST LUMPECTOMY WITH RADIOACTIVE SEED AND SENTINEL LYMPH NODE BIOPSY Left  05/17/2017   Procedure: LEFT BREAST LUMPECTOMY WITH RADIOACTIVE SEED AND SENTINEL LYMPH NODE BIOPSY;  Surgeon: Jovita Kussmaul, MD;  Location: [facility];  Service: General;  Laterality: Left;  . CESAREAN SECTION     x3  . excision of precancerous mole     lft arm, chest    Social History   Tobacco Use  . Smoking status: Never Smoker  . Smokeless tobacco: Never Used  Substance Use Topics  . Alcohol use: Yes    Alcohol/week: 2.0 standard drinks    Types: 2 Standard drinks or equivalent per week    Family History  Problem Relation Age of Onset  . Mitral valve prolapse Mother   . Prostate cancer Father   . Transient ischemic attack Father   . Breast cancer Paternal Aunt 52       bile duct cancer  . Colon cancer Neg Hx   . Rectal cancer Neg Hx   . Stomach cancer Neg Hx   . Esophageal cancer Neg Hx     Review of Systems  Constitutional: Negative for chills and fever.  Respiratory: Negative for cough and shortness of breath.   Cardiovascular: Negative for chest pain, palpitations and leg swelling.  Gastrointestinal: Negative for abdominal pain, nausea and vomiting.  per hpi   OBJECTIVE:  Today's Vitals   08/16/20 0846  BP: 137/82  Pulse: 78  Resp: 16  Temp: 98.2 F (36.8 C)  TempSrc: Temporal  SpO2: 99%  Weight: 183 lb (83 kg)  Height: 5' 5.5" (1.664 m)   Body mass index is 29.99 kg/m.   Physical Exam Vitals and nursing note reviewed.  Constitutional:      Appearance: She is well-developed.  HENT:     Head: Normocephalic and atraumatic.     Mouth/Throat:     Pharynx: No oropharyngeal exudate.  Eyes:     General: No scleral icterus.    Extraocular Movements: Extraocular movements intact.     Conjunctiva/sclera: Conjunctivae normal.     Pupils: Pupils are equal, round, and reactive to light.  Cardiovascular:     Rate and Rhythm: Normal rate and regular rhythm.     Heart sounds: Normal heart sounds. No murmur heard.  No friction rub. No gallop.   Pulmonary:       Effort: Pulmonary effort is normal.     Breath sounds: Normal breath sounds. No wheezing, rhonchi or rales.  Musculoskeletal:     Cervical back: Neck supple.     Right lower leg: No edema.     Left lower  leg: No edema.  Skin:    General: Skin is warm and dry.  Neurological:     Mental Status: She is alert and oriented to person, place, and time.     No results found for this or any previous visit (from the past 24 hour(s)).  No results found.   ASSESSMENT and PLAN  1. Essential hypertension Controlled. Continue current regime.  - amLODipine (NORVASC) 5 MG tablet; Take 1 tablet (5 mg total) by mouth daily. - Comprehensive metabolic panel - Lipid panel  2. Prediabetes Cont with LFM - Hemoglobin A1c  3. Muscle cramps Discussed supportive measures. Keep working on achilles tendonitis.  - Magnesium  Other orders - losartan-hydrochlorothiazide (HYZAAR) 100-12.5 MG tablet; Take 1 tablet by mouth daily. - Flu Vaccine QUAD 6+ mos PF IM (Fluarix Quad PF)  Return in about 6 months (around 02/14/2021) for nurse vistit for shingrix vaccine .    Rutherford Guys, MD [facility] [place], [place] [address] Ph.  [phone] Fax [phone]

## 2020-08-16 NOTE — Patient Instructions (Signed)
° ° ° °  If you have lab work done today you will be contacted with your lab results within the next 2 weeks.  If you have not heard from us then please contact us. The fastest way to get your results is to register for My Chart. ° ° °IF you received an x-ray today, you will receive an invoice from [facility]. Please contact [facility] at [phone] with questions or concerns regarding your invoice.  ° °IF you received labwork today, you will receive an invoice from [facility]. Please contact [facility] at [phone] with questions or concerns regarding your invoice.  ° °Our billing staff will not be able to assist you with questions regarding bills from these companies. ° °You will be contacted with the lab results as soon as they are available. The fastest way to get your results is to activate your My Chart account. Instructions are located on the last page of this paperwork. If you have not heard from us regarding the results in 2 weeks, please contact this office. °  ° ° ° °

## 2020-08-17 LAB — MAGNESIUM: Magnesium: 1.9 mg/dL (ref 1.6–2.3)

## 2020-08-27 ENCOUNTER — Encounter: Payer: Self-pay | Admitting: Radiology

## 2020-08-30 ENCOUNTER — Ambulatory Visit: Payer: BC Managed Care – PPO

## 2020-09-05 NOTE — Progress Notes (Signed)
59 y.o. [id number] Married White or Caucasian female here for annual exam.  Denies vaginal bleeding.  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    Patient's last menstrual period was 07/17/2013.          Sexually active: Yes.    The current method of family planning is post menopausal status.    Exercising: Yes.    walking Smoker:  no  Health Maintenance: Pap:  04-04-18 neg HPV HR neg, 07-07-2019 neg History of abnormal Pap:  yes MMG:  05-07-2020 category b density birads 2:neg Colonoscopy:  02-22-15 normal f/u 10 yrs BMD:   02-05-2020 TDaP:  2019 Pneumonia vaccine(s):  no Shingrix:   No, scheduled for shingrix in the morning Hep C testing: neg 2017 Screening Labs: 10/21 with Dr. Pamella Pert   reports that she has never smoked. She has never used smokeless tobacco. She reports current alcohol use of about 2.0 standard drinks of alcohol per week. She reports that she does not use drugs.  Past Medical History:  Diagnosis Date  . Allergy   . Anemia    hx  . GERD (gastroesophageal reflux disease)   . History of abnormal cervical Pap smear   . HSV infection    h/o  . HTN (hypertension)   . Malignant neoplasm of upper-inner quadrant of left breast in female, estrogen receptor positive ([place]) 04/22/2017  . Ovarian cyst    h/o  . Perennial allergic rhinitis    [place]   . Seasonal allergic rhinitis    [place]    Past Surgical History:  Procedure Laterality Date  . ADENOIDECTOMY     only adenoids still have tonsils  . BREAST LUMPECTOMY WITH RADIOACTIVE SEED AND SENTINEL LYMPH NODE BIOPSY Left 05/17/2017   Procedure: LEFT BREAST LUMPECTOMY WITH RADIOACTIVE SEED AND SENTINEL LYMPH NODE BIOPSY;  Surgeon: Jovita Kussmaul, MD;  Location: [facility];  Service: General;  Laterality: Left;  . CESAREAN SECTION     x3  . excision of precancerous mole     lft arm, chest    Current Outpatient Medications  Medication Sig Dispense Refill  . amLODipine (NORVASC) 5 MG tablet Take 1 tablet (5  mg total) by mouth daily. 90 tablet 1  . anastrozole (ARIMIDEX) 1 MG tablet TAKE 1 TABLET(1 MG) BY MOUTH DAILY 90 tablet 3  . famotidine (PEPCID) 20 MG tablet Take 20 mg by mouth daily as needed for heartburn or indigestion.    . fexofenadine (ALLEGRA) 180 MG tablet Take 180 mg by mouth as needed.     . IBUPROFEN PO Take by mouth as needed.    Marland Kitchen losartan-hydrochlorothiazide (HYZAAR) 100-12.5 MG tablet Take 1 tablet by mouth daily. 90 tablet 1  . valACYclovir (VALTREX) 500 MG tablet Take by mouth.    . nitrofurantoin, macrocrystal-monohydrate, (MACROBID) 100 MG capsule Take 100 mg by mouth as needed. (Patient not taking: Reported on 09/09/2020)     No current facility-administered medications for this visit.    Family History  Problem Relation Age of Onset  . Mitral valve prolapse Mother   . Prostate cancer Father   . Transient ischemic attack Father   . Breast cancer Paternal Aunt 39       bile duct cancer  . Colon cancer Neg Hx   . Rectal cancer Neg Hx   . Stomach cancer Neg Hx   . Esophageal cancer Neg Hx     Review of Systems  Constitutional: Negative.   HENT: Negative.  Eyes: Negative.   Respiratory: Negative.   Cardiovascular: Negative.   Gastrointestinal: Negative.   Endocrine: Negative.   Genitourinary: Negative.   Musculoskeletal: Negative.   Skin: Negative.   Allergic/Immunologic: Negative.   Neurological: Negative.   Hematological: Negative.   Psychiatric/Behavioral: Negative.     Exam:   BP 132/80   Pulse 70   Resp 16   Ht 5' 5.25" (1.657 m)   Wt 183 lb (83 kg)   LMP 07/17/2013   BMI 30.22 kg/m   Height: 5' 5.25" (165.7 cm)  General appearance: alert, cooperative and appears stated age Head: Normocephalic, without obvious abnormality, atraumatic Neck: no adenopathy, supple, symmetrical, trachea midline and thyroid normal to inspection and palpation Lungs: clear to auscultation bilaterally Breasts: no masses, abnormal skin changes, LAD, nipple  discharge; left breast scars are well healed Heart: regular rate and rhythm Abdomen: soft, non-tender; bowel sounds normal; no masses,  no organomegaly Extremities: extremities normal, atraumatic, no cyanosis or edema Skin: Skin color, texture, turgor normal. No rashes or lesions Lymph nodes: Cervical, supraclavicular, and axillary nodes normal. No abnormal inguinal nodes palpated Neurologic: Grossly normal   Pelvic: External genitalia:  no lesions              Urethra:  normal appearing urethra with no masses, tenderness or lesions              Bartholins and Skenes: normal                 Vagina: normal appearing vagina with normal color and discharge, no lesions              Cervix: no lesions              Pap taken: Yes.   Bimanual Exam:  Uterus:  normal size, contour, position, consistency, mobility, non-tender              Adnexa: normal adnexa and no mass, fullness, tenderness               Rectovaginal: Confirms               Anus:  normal sphincter tone, no lesions  Chaperone, Terence Lux, CMA, was present for exam.  A:  Well Woman with normal exam PMP, no HRT H/o invasive ductal breast ca, s/p lumpectomy 2018.  On anastrozole.  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  Doing yearly diagnostic due to breast cancer hx pap smear obtained today.  She desires yearly.  Neg HR HPV obtained 5/19, not indicated today New PCPs discussed.  She will plan blood work once established with PCP BMD is due and has been ordered by Dr. Burr Medico Vaccines are up to date Return annually or prn

## 2020-09-09 ENCOUNTER — Ambulatory Visit: Payer: BC Managed Care – PPO | Admitting: Obstetrics & Gynecology

## 2020-09-09 ENCOUNTER — Other Ambulatory Visit: Payer: Self-pay

## 2020-09-09 ENCOUNTER — Other Ambulatory Visit (HOSPITAL_COMMUNITY)
Admission: RE | Admit: 2020-09-09 | Discharge: 2020-09-09 | Disposition: A | Discharge status: Home / Self Care | Payer: BC Managed Care – PPO | Source: Ambulatory Visit | Attending: Obstetrics & Gynecology | Admitting: Obstetrics & Gynecology

## 2020-09-09 ENCOUNTER — Encounter: Payer: Self-pay | Admitting: Obstetrics & Gynecology

## 2020-09-09 VITALS — BP 132/80 | HR 70 | Resp 16 | Ht 65.25 in | Wt 183.0 lb

## 2020-09-09 DIAGNOSIS — Z124 Encounter for screening for malignant neoplasm of cervix: Secondary | ICD-10-CM | POA: Diagnosis not present

## 2020-09-09 DIAGNOSIS — Z01419 Encounter for gynecological examination (general) (routine) without abnormal findings: Secondary | ICD-10-CM

## 2020-09-09 MED ORDER — NITROFURANTOIN MONOHYD MACRO 100 MG PO CAPS: ORAL_CAPSULE | ORAL | 0 refills | Status: DC

## 2020-09-09 NOTE — Patient Instructions (Signed)
Kristina Sanders [name] [facility]

## 2020-09-10 ENCOUNTER — Ambulatory Visit (INDEPENDENT_AMBULATORY_CARE_PROVIDER_SITE_OTHER): Payer: BC Managed Care – PPO | Admitting: Registered Nurse

## 2020-09-10 DIAGNOSIS — Z23 Encounter for immunization: Secondary | ICD-10-CM | POA: Diagnosis not present

## 2020-09-10 DIAGNOSIS — Z299 Encounter for prophylactic measures, unspecified: Secondary | ICD-10-CM

## 2020-09-10 LAB — CYTOLOGY - PAP: Diagnosis: NEGATIVE

## 2020-09-10 NOTE — Patient Instructions (Signed)
° ° ° °  If you have lab work done today you will be contacted with your lab results within the next 2 weeks.  If you have not heard from us then please contact us. The fastest way to get your results is to register for My Chart. ° ° °IF you received an x-ray today, you will receive an invoice from [facility]. Please contact [facility] at [phone] with questions or concerns regarding your invoice.  ° °IF you received labwork today, you will receive an invoice from [facility]. Please contact [facility] at [phone] with questions or concerns regarding your invoice.  ° °Our billing staff will not be able to assist you with questions regarding bills from these companies. ° °You will be contacted with the lab results as soon as they are available. The fastest way to get your results is to activate your My Chart account. Instructions are located on the last page of this paperwork. If you have not heard from us regarding the results in 2 weeks, please contact this office. °  ° ° ° °

## 2020-11-06 IMAGING — MR MR HEEL *R* W/O CM
4 of 6 series · 15 of 40 positions shown · non-contrast
Comparison: Radiographs 05/16/2020

CLINICAL DATA: Posterior ankle/heel pain.

EXAM:
MR OF THE RIGHT HEEL WITHOUT CONTRAST
TECHNIQUE: Multiplanar, multisequence MR imaging of the ankle was performed. No
intravenous contrast was administered.

[Series 3: T2 fat-sat · axial · 3.5mm · 0.22mm/px · z∈[-51,+22]mm · 3 of 29 slices shown (1 of 2)]
[im 5/29]
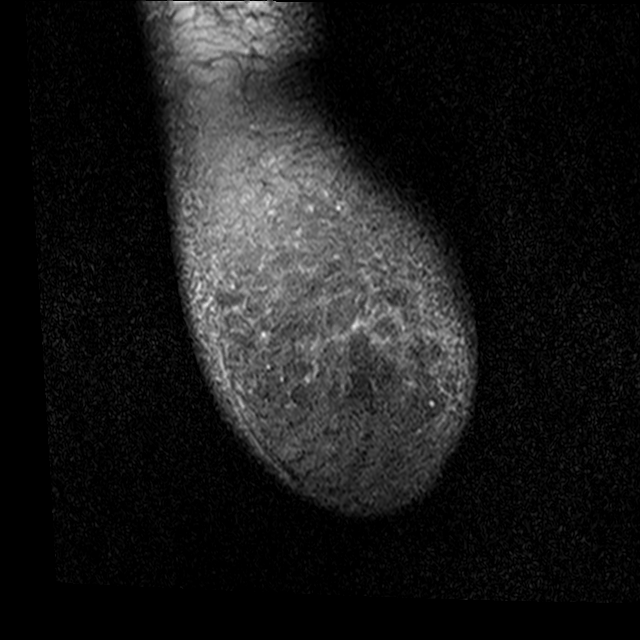
[im 15/29]
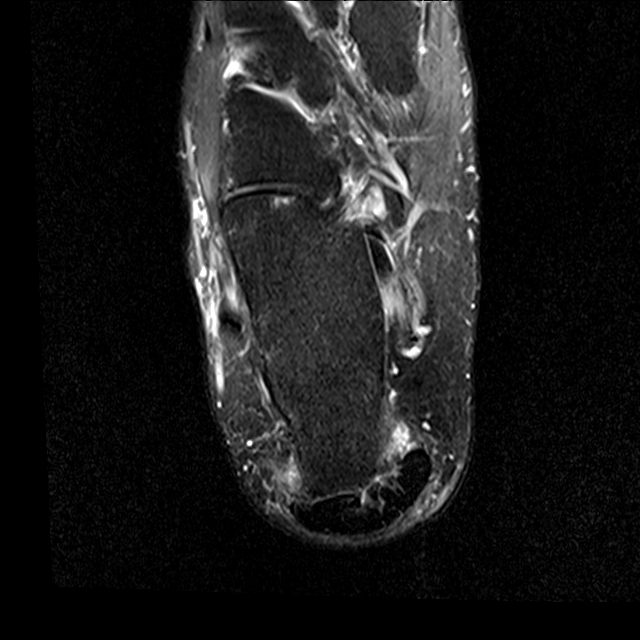
[im 24/29]
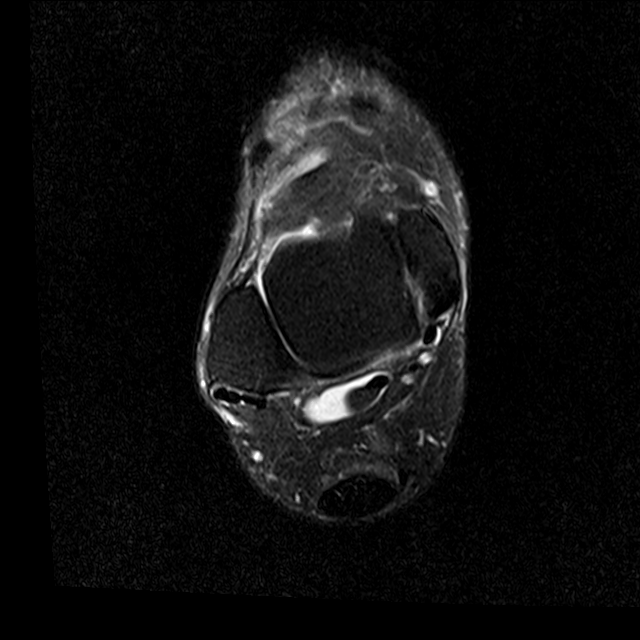

[Series 4: PD fat-sat · axial · 3.5mm · 0.22mm/px · z∈[-66,+22]mm · 6 of 29 slices shown (1 of 2)]
[im 1/29]
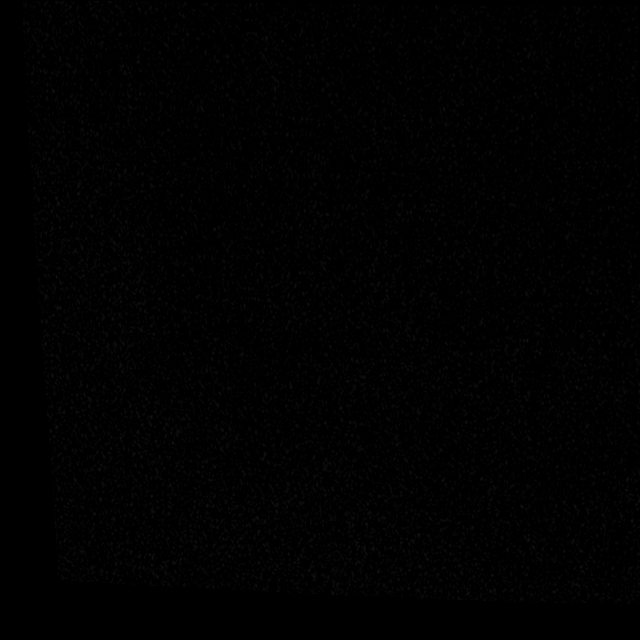
[im 5/29]
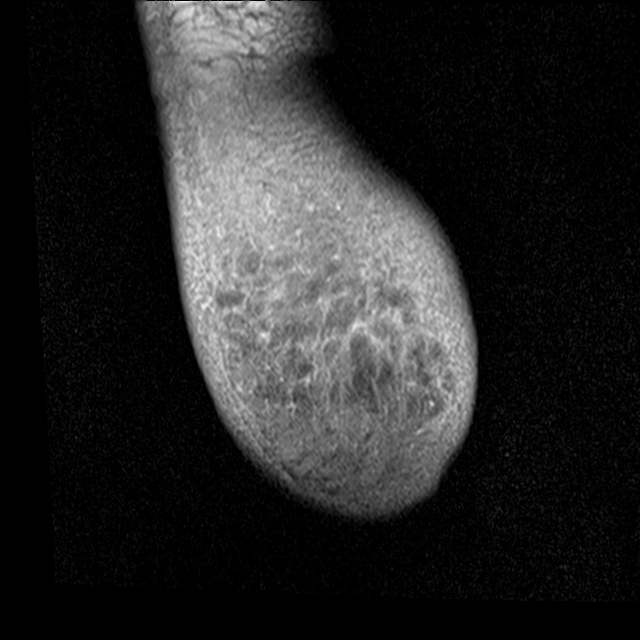
[im 10/29]
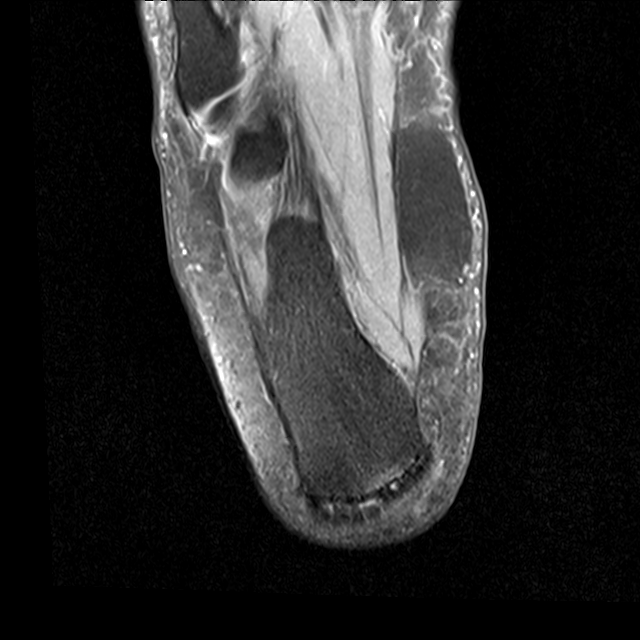
[im 15/29]
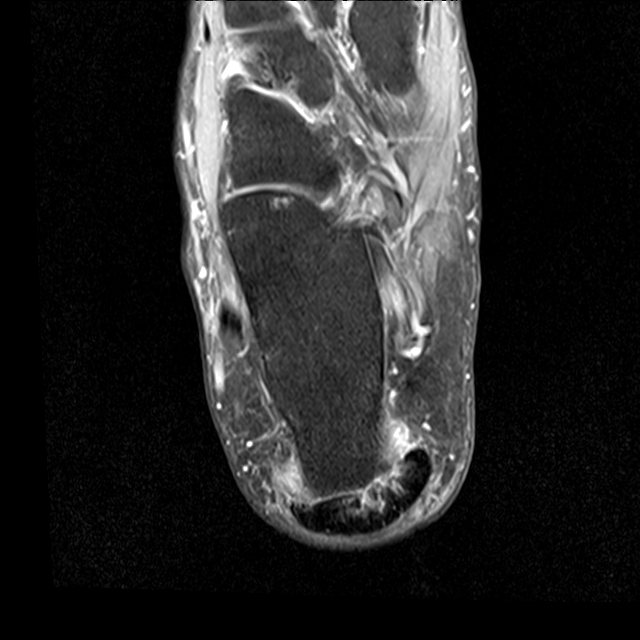
[im 19/29]
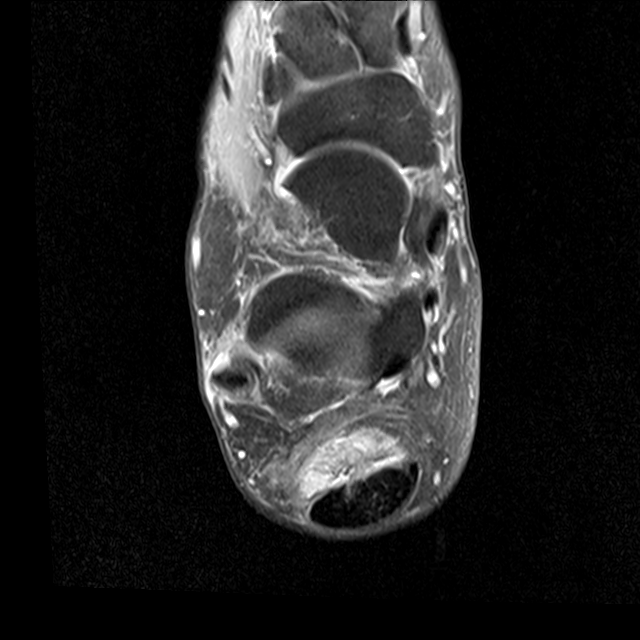
[im 24/29]
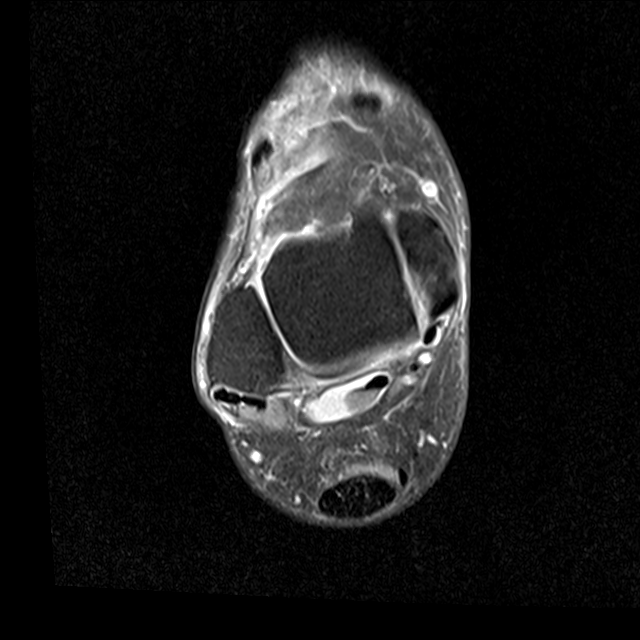

[Series 5: T2 fat-sat · sagittal · 3.5mm · 0.25mm/px · 3 of 22 slices shown (2 of 2)]
[im 5/22]
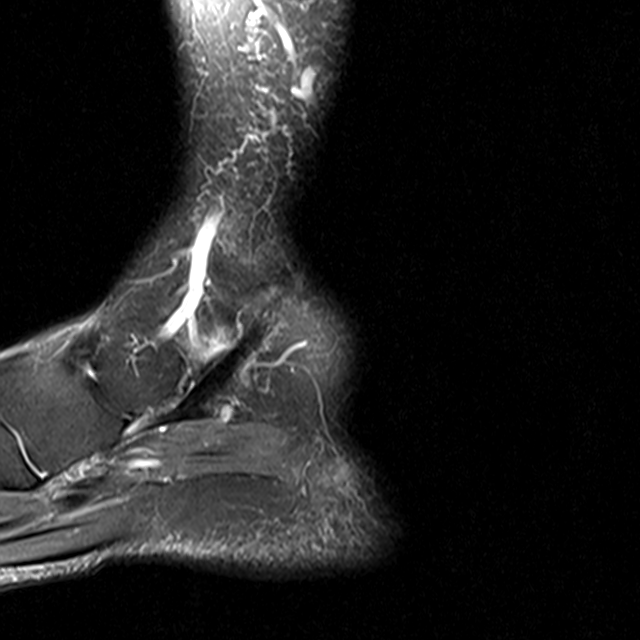
[im 13/22]
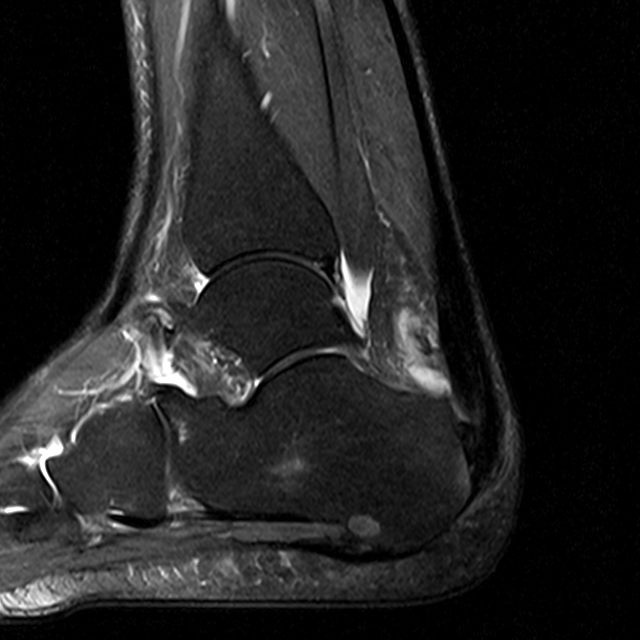
[im 22/22]
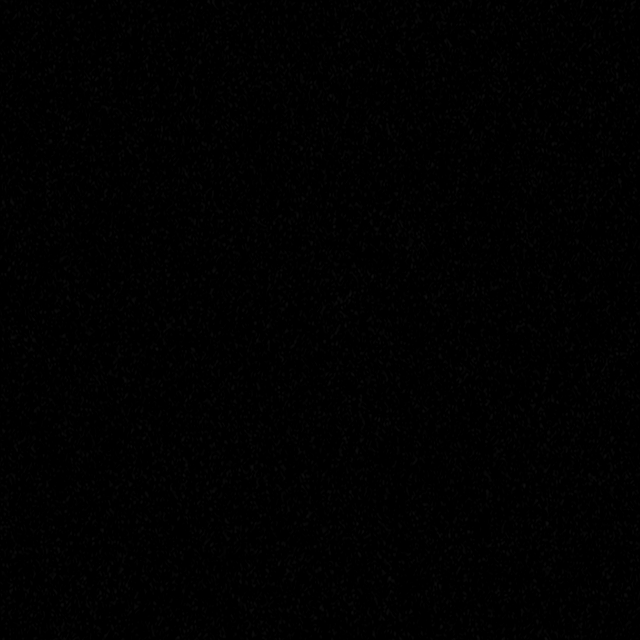

[Series 8: PD fat-sat · sagittal · 3.5mm · 0.47mm/px · 3 of 21 slices shown (2 of 2)]
[im 5/21]
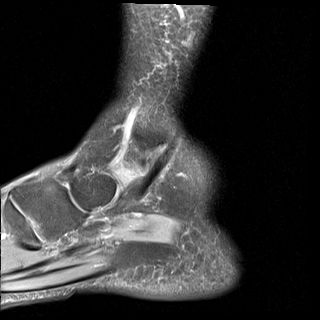
[im 13/21]
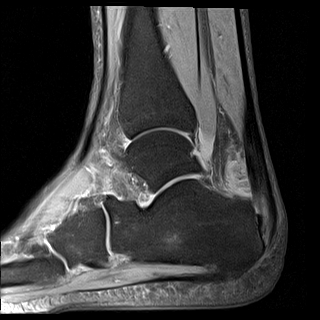
[im 21/21]
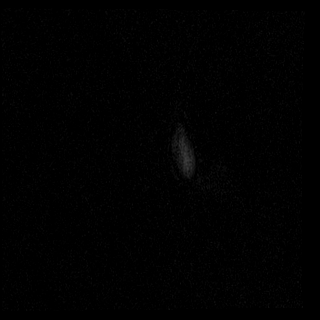

[15 of 40 positions shown; findings below may reference images not displayed]

FINDINGS: TENDONS

Peroneal: Mild tendinopathy and tenosynovitis mainly involving the
peroneus brevis tendon. No complete tear/rupture.

Posteromedial: Mild tenosynovitis but no significant tendinopathy or
tear.

Anterior: Intact. No significant tendinopathy or tenosynovitis.

Achilles: Diffuse thickening and slight increased T2 signal
intensity consistent with chronic Achilles tendinopathy. Distally
there is interstitial tears and a small partial-thickness tear
involving the deep attachment fibers. No complete tear/rupture.
There is also associated complex retrocalcaneal bursitis with some
surrounding inflammatory changes. Associated mild reactive marrow
edema involving the calcaneus at the attachment site. No calcaneal
stress fracture.

Plantar Fascia: Calcaneal heel spurs noted along with mild changes
of plantar fasciitis. Mild reactive changes in the calcaneus.

LIGAMENTS

Lateral: Intact

Medial: Intact

CARTILAGE

Ankle Joint: No significant degenerative changes. No cartilage
defects or osteochondral lesion. No joint effusion.

Subtalar Joints/Sinus Tarsi: The subtalar joints are maintained.
Minimal degenerative changes. The sinus tarsi is normal. The
cervical and interosseous ligaments are intact. The spring ligament
is intact.

Bones: Mild reactive marrow edema noted at the attachment of the
Achilles tendon and plantar fascia but no calcaneal stress fracture.
The visualized midfoot bony structures are intact.

Other: Unremarkable foot and ankle musculature.
IMPRESSION: 1. Changes of chronic Achilles tendinopathy. Distally there are
interstitial tears and a small partial-thickness tear involving the
deep attachment fibers. No complete tear/rupture. Associated complex
retrocalcaneal bursitis with some surrounding inflammatory changes.
2. Mild tendinopathy and tenosynovitis involving the peroneus brevis
tendon.
3. Intact medial and lateral ankle ligaments and tendons.
4. Calcaneal heel spurs along with mild changes of plantar
fasciitis.

## 2020-11-28 ENCOUNTER — Other Ambulatory Visit: Payer: Self-pay

## 2020-11-28 ENCOUNTER — Ambulatory Visit: Payer: BC Managed Care – PPO

## 2020-12-05 ENCOUNTER — Ambulatory Visit (INDEPENDENT_AMBULATORY_CARE_PROVIDER_SITE_OTHER): Payer: BC Managed Care – PPO | Admitting: Family Medicine

## 2020-12-05 ENCOUNTER — Other Ambulatory Visit: Payer: Self-pay

## 2020-12-05 DIAGNOSIS — Z23 Encounter for immunization: Secondary | ICD-10-CM

## 2021-01-28 NOTE — Progress Notes (Signed)
[facility] [facility]   [phone] [phone]   Clinic Follow up Note   Patient Care Team: Jacelyn Pi, Lilia Argue, MD as PCP - General (Family Medicine) Megan Salon, MD as Consulting Physician (Gynecology) Harold Hedge, Darrick Grinder, MD (Allergy and Immunology) Truitt Merle, MD as Consulting Physician (Hematology) Jovita Kussmaul, MD as Consulting Physician (General Surgery) Eppie Gibson, MD as Attending Physician (Radiation Oncology) Gardenia Phlegm, NP as Nurse Practitioner (Hematology and Oncology) 01/29/2021  CHIEF COMPLAINT: Follow-up left breast cancer  SUMMARY OF ONCOLOGIC HISTORY: Oncology History Overview Note  Cancer Staging Malignant neoplasm of upper-inner quadrant of left breast in female, estrogen receptor positive ([place]) Staging form: Breast, AJCC 8th Edition - Clinical stage from 04/19/2017: Stage IA (cT1b, cN0, cM0, G2, ER: Positive, PR: Positive, HER2: Negative) - Signed by Truitt Merle, MD on 04/28/2017 - Pathologic stage from 05/17/2017: Stage IA (pT1c, pN0, cM0, G2, ER: Positive, PR: Positive, HER2: Negative) - Signed by Truitt Merle, MD on 07/24/2017     Malignant neoplasm of upper-inner quadrant of left breast in female, estrogen receptor positive ([place])  04/15/2017 Mammogram   Diagnostic mammogram and ultrasound of left breast:  The new 1.2 cm oval solid mass in the left breast 11:00 position, posterior depth, is highly suspicious for malignancy. The grouped mucus of calcium calcifications in the left breast at the 1:00 and 10:00 middle depth are benign. Ultrasound of the left axilla was negative for adenopathy.   04/19/2017 Receptors her2   Estrogen Receptor: 100%, POSITIVE, STRONG STAINING INTENSITY Progesterone Receptor: 100%, POSITIVE, STRONG STAINING INTENSITY Proliferation Marker Ki67: 30% HER2 (-)   04/19/2017 Initial Biopsy   Breast, left, needle core biopsy INVASIVE DUCTAL CARCINOMA, GRADE 1 DUCTAL CARCINOMA IN SITU, GRADE 1    04/22/2017 Initial Diagnosis   Malignant neoplasm of upper-inner quadrant of left breast in female, estrogen receptor positive ([place])   05/17/2017 Surgery   LEFT BREAST LUMPECTOMY WITH RADIOACTIVE SEED AND SENTINEL LYMPH NODE BIOPSY by Dr. Marlou Starks   05/17/2017 Pathology Results   Diagnosis 05/17/17 1. 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 Lymph node, sentinel, biopsy, Left axillary ONE LYMPH NODE, NEGATIVE FOR CARCINOMA (0/1) 3. Lymph node, sentinel, biopsy, Left axillary ONE LYMPH NODE, NEGATIVE FOR CARCINOMA (0/1) 4. Breast, excision, Left new anterior margin SMALL FOCUS OF DUCTAL CARCINOMA IN SITU THE CARCINOMA IS 1 MM FROM THE ANTERIOR INKED MARGIN 5. Breast, excision, Left new medial margin BENIGN BREAST TISSUE NEGATIVE FOR CARCINOMA 6. Breast, excision, Left new superior margin FIBROCYSTIC CHANGES WITH CALCIFICATIONS NEGATIVE FOR CARCINOMA 7. Breast, excision, Left new lateral margin FIBROCYSTIC CHANGES WITH CALCIFICATIONS USUAL DUCTAL HYPERPLASIA NEGATIVE FOR CARCINOMA     05/17/2017 Miscellaneous   Oncotype   Recurrence score: 0 Low Risk 10-year risk of recurrence of 3% with Tamoxifen alone   06/24/2017 - 07/23/2017 Radiation Therapy   Radiation with Dr. Isidore Moos   07/2017 -  Anti-estrogen oral therapy   Anastrozole 1 mg daily     04/21/2018 Mammogram   04/21/2018 Mammogram Benign     CURRENT THERAPY: Anastrozole 1 mg daily starting 07/2017  INTERVAL HISTORY: Ms. Rebman returns for follow-up as scheduled.  She was last seen by Dr. Burr Medico on 01/29/2020.  BMD shows osteopenia on 02/05/2020. Routine mammogram on 05/07/2020 showed stable benign left  breast calcifications, no evidence of malignancy.  She is doing well, denies changes in her health since last visit.   Energy and appetite are normal.  She has occasional sharp right breast pain, does not persist or limit activities.  Denies new lump/mass, nipple discharge or inversion, or skin change.  Denies bone or joint pain, increased hot flashes, change in bowel habits, bleeding, fever, chills, cough, chest pain, dyspnea, or new concerns.  She continues calcium and vitamin D.   MEDICAL HISTORY:  Past Medical History:  Diagnosis Date  . Allergy   . Anemia    hx  . GERD (gastroesophageal reflux disease)   . History of abnormal cervical Pap smear   . HSV infection    h/o  . HTN (hypertension)   . Malignant neoplasm of upper-inner quadrant of left breast in female, estrogen receptor positive ([place]) 04/22/2017  . Ovarian cyst    h/o  . Perennial allergic rhinitis    [place]   . Seasonal allergic rhinitis    [place]    SURGICAL HISTORY: Past Surgical History:  Procedure Laterality Date  . ADENOIDECTOMY     only adenoids still have tonsils  . BREAST LUMPECTOMY WITH RADIOACTIVE SEED AND SENTINEL LYMPH NODE BIOPSY Left 05/17/2017   Procedure: LEFT BREAST LUMPECTOMY WITH RADIOACTIVE SEED AND SENTINEL LYMPH NODE BIOPSY;  Surgeon: Jovita Kussmaul, MD;  Location: [facility];  Service: General;  Laterality: Left;  . CESAREAN SECTION     x3  . excision of precancerous mole     lft arm, chest    I have reviewed the social history and family history with the patient and they are unchanged from previous note.  ALLERGIES:  has No Known Allergies.  MEDICATIONS:  Current Outpatient Medications  Medication Sig Dispense Refill  . amLODipine (NORVASC) 5 MG tablet Take 1 tablet (5 mg total) by mouth daily. 90 tablet 1  . anastrozole (ARIMIDEX) 1 MG tablet TAKE 1 TABLET(1 MG) BY MOUTH DAILY 90 tablet 3  . famotidine (PEPCID) 20 MG tablet Take 20 mg by mouth daily as needed for heartburn or indigestion.    . fexofenadine (ALLEGRA) 180 MG tablet Take 180 mg by mouth as needed.     . IBUPROFEN PO Take by mouth as  needed.    Marland Kitchen losartan-hydrochlorothiazide (HYZAAR) 100-12.5 MG tablet Take 1 tablet by mouth daily. 90 tablet 1  . nitrofurantoin, macrocrystal-monohydrate, (MACROBID) 100 MG capsule Take 1 tab po as needed for UTI prevention 30 capsule 0  . valACYclovir (VALTREX) 500 MG tablet Take by mouth.     No current facility-administered medications for this visit.    PHYSICAL EXAMINATION: ECOG PERFORMANCE STATUS: 0 - Asymptomatic  Vitals:   01/29/21 1326  BP: (!) 151/75  Pulse: 78  Resp: 17  Temp: 98.7 F (37.1 C)  SpO2: 100%   Filed Weights   01/29/21 1326  Weight: 185 lb 9.6 oz (84.2 kg)    GENERAL:alert, no distress and comfortable SKIN: No rash EYES: sclera clear LYMPH:  no palpable cervical or supraclavicular lymphadenopathy  LUNGS:  normal breathing effort HEART: no lower extremity edema NEURO: alert & oriented x 3 with fluent speech, no focal motor/sensory deficits Breast exam: Breasts are symmetric without nipple discharge or inversion.  S/p left lumpectomy, incisions completely healed.  No palpable mass in either breast or axilla that I could appreciate.  LABORATORY DATA:  I have reviewed the data as listed CBC Latest Ref Rng & Units 01/29/2021 01/29/2020 05/01/2019  WBC 4.0 -  10.5 K/uL 7.5 9.2 9.5  Hemoglobin 12.0 - 15.0 g/dL 12.4 12.6 12.2  Hematocrit 36.0 - 46.0 % 36.3 37.3 35.4(L)  Platelets 150 - 400 K/uL 237 258 211     CMP Latest Ref Rng & Units 01/29/2021 08/16/2020 01/29/2020  Glucose 70 - 99 mg/dL 176(H) 106(H) 116(H)  BUN 6 - 20 mg/dL _0 Creatinine 0.44 - 1.00 mg/dL 0.73 0.56(L) 0.72  Sodium 135 - 145 mmol/L 139 136 139  Potassium 3.5 - 5.1 mmol/L 3.8 4.1 3.7  Chloride 98 - 111 mmol/L 102 100 102  CO2 22 - 32 mmol/L _1 Calcium 8.9 - 10.3 mg/dL 9.5 9.4 9.9  Total Protein 6.5 - 8.1 g/dL 7.1 6.8 7.2  Total Bilirubin 0.3 - 1.2 mg/dL 0.9 0.7 0.6  Alkaline Phos 38 - 126 U/L 66 69 76  AST 15 - 41 U/L _2 ALT 0 - 44 U/L _3 RADIOGRAPHIC STUDIES: I have personally reviewed the radiological images as listed and agreed with the findings in the report. No results found.   ASSESSMENT & PLAN: JERSI MCMASTER a 60 y.o.caucasian female who has a history of Anemia, HTN, and Ovarian cysts.   1. Malignant neoplasm of upper-inner quadrant of left breast in female, pT1cN0M0, stage IA, ER+/PR+/HER2-, Grade2 -diagnosed 04/2017, s/p lumpectomy, adjuvant radiation, and adjuvant AI with anastrozole. Oncotype recurrence score is 0, no benefit of chemotherapy for low risk group.  -She began adjuvant antiestrogen therapy with anastrozole in 06/2017, tolerating well. Plan to continue for total 5-7 years. -annual mammogram 04/2020 negative -continue AI and surveillance   2. HTN -on losartain; f/u with PCP   3. Osteopenia  -Her 11/24/2017 and 01/2020 DEXA shows osteopenia, overall mostly stable.  -continue calcium and vitamin D  Disposition: Ms. Montel Clock is clinically doing well.  She continues anastrozole which she tolerates very well.  Breast exam is benign, CBC and CMP are unremarkable.  Mammogram 04/2020 is negative.  There is no clinical concern for breast cancer recurrence.  She will continue anastrozole and surveillance.  She will continue calcium and vitamin D, next DEXA in 01/2022.  I have placed orders for mammogram in 04/2021.  She is 4 years out from her initial diagnosis, the recurrence risk is lower now.  She will see OB/GYN Dr. Sabra Heck with breast exam in 08/2021 and return to [place] for annual surveillance visit in 1 year.    Orders Placed This Encounter  Procedures  . MM DIAG BREAST TOMO BILATERAL    Standing Status:   Future    Standing Expiration Date:   01/29/2022    Scheduling Instructions:     [facility]    Order Specific Question:   Reason for Exam (SYMPTOM  OR DIAGNOSIS REQUIRED)    Answer:   History of left breast cancer 2018 s/p lumpectomy and radiation    Order Specific Question:   Is the patient  pregnant?    Answer:   No    Order Specific Question:   Preferred imaging location?    Answer:   External   All questions were answered. The patient knows to call the clinic with any problems, questions or concerns. No barriers to learning were detected.     Alla Feeling, NP 01/29/21

## 2021-01-29 ENCOUNTER — Other Ambulatory Visit: Payer: Self-pay

## 2021-01-29 ENCOUNTER — Inpatient Hospital Stay: Payer: BC Managed Care – PPO | Attending: Nurse Practitioner | Admitting: Nurse Practitioner

## 2021-01-29 ENCOUNTER — Encounter: Payer: Self-pay | Admitting: Nurse Practitioner

## 2021-01-29 ENCOUNTER — Inpatient Hospital Stay: Payer: BC Managed Care – PPO

## 2021-01-29 DIAGNOSIS — M8580 Other specified disorders of bone density and structure, unspecified site: Secondary | ICD-10-CM | POA: Insufficient documentation

## 2021-01-29 DIAGNOSIS — K219 Gastro-esophageal reflux disease without esophagitis: Secondary | ICD-10-CM | POA: Insufficient documentation

## 2021-01-29 DIAGNOSIS — Z923 Personal history of irradiation: Secondary | ICD-10-CM | POA: Insufficient documentation

## 2021-01-29 DIAGNOSIS — Z170 Estrogen receptor positive status [ER+]: Secondary | ICD-10-CM | POA: Diagnosis not present

## 2021-01-29 DIAGNOSIS — C50212 Malignant neoplasm of upper-inner quadrant of left female breast: Secondary | ICD-10-CM

## 2021-01-29 DIAGNOSIS — Z79899 Other long term (current) drug therapy: Secondary | ICD-10-CM | POA: Insufficient documentation

## 2021-01-29 DIAGNOSIS — Z79811 Long term (current) use of aromatase inhibitors: Secondary | ICD-10-CM | POA: Insufficient documentation

## 2021-01-29 DIAGNOSIS — I10 Essential (primary) hypertension: Secondary | ICD-10-CM | POA: Insufficient documentation

## 2021-01-29 LAB — CBC WITH DIFFERENTIAL/PLATELET
Abs Immature Granulocytes: 0.02 10*3/uL (ref 0.00–0.07)
Basophils Absolute: 0.1 10*3/uL (ref 0.0–0.1)
Basophils Relative: 1 %
Eosinophils Absolute: 0.3 10*3/uL (ref 0.0–0.5)
Eosinophils Relative: 4 %
HCT: 36.3 % (ref 36.0–46.0)
Hemoglobin: 12.4 g/dL (ref 12.0–15.0)
Immature Granulocytes: 0 %
Lymphocytes Relative: 22 %
Lymphs Abs: 1.7 10*3/uL (ref 0.7–4.0)
MCH: 32.9 pg (ref 26.0–34.0)
MCHC: 34.2 g/dL (ref 30.0–36.0)
MCV: 96.3 fL (ref 80.0–100.0)
Monocytes Absolute: 0.5 10*3/uL (ref 0.1–1.0)
Monocytes Relative: 6 %
Neutro Abs: 5 10*3/uL (ref 1.7–7.7)
Neutrophils Relative %: 67 %
Platelets: 237 10*3/uL (ref 150–400)
RBC: 3.77 MIL/uL — ABNORMAL LOW (ref 3.87–5.11)
RDW: 11.9 % (ref 11.5–15.5)
WBC: 7.5 10*3/uL (ref 4.0–10.5)
nRBC: 0 % (ref 0.0–0.2)

## 2021-01-29 LAB — COMPREHENSIVE METABOLIC PANEL
ALT: 21 U/L (ref 0–44)
AST: 20 U/L (ref 15–41)
Albumin: 4.2 g/dL (ref 3.5–5.0)
Alkaline Phosphatase: 66 U/L (ref 38–126)
Anion gap: 9 (ref 5–15)
BUN: 12 mg/dL (ref 6–20)
CO2: 28 mmol/L (ref 22–32)
Calcium: 9.5 mg/dL (ref 8.9–10.3)
Chloride: 102 mmol/L (ref 98–111)
Creatinine, Ser: 0.73 mg/dL (ref 0.44–1.00)
GFR, Estimated: >60 mL/min (ref >60)
Glucose, Bld: 176 mg/dL — ABNORMAL HIGH (ref 70–99)
Potassium: 3.8 mmol/L (ref 3.5–5.1)
Sodium: 139 mmol/L (ref 135–145)
Total Bilirubin: 0.9 mg/dL (ref 0.3–1.2)
Total Protein: 7.1 g/dL (ref 6.5–8.1)

## 2021-01-29 MED ORDER — ANASTROZOLE 1 MG PO TABS: ORAL_TABLET | ORAL | 3 refills | Status: DC

## 2021-04-10 ENCOUNTER — Encounter (HOSPITAL_BASED_OUTPATIENT_CLINIC_OR_DEPARTMENT_OTHER): Payer: Self-pay | Admitting: Nurse Practitioner

## 2021-04-10 ENCOUNTER — Ambulatory Visit (HOSPITAL_BASED_OUTPATIENT_CLINIC_OR_DEPARTMENT_OTHER): Payer: BC Managed Care – PPO | Admitting: Nurse Practitioner

## 2021-04-10 ENCOUNTER — Other Ambulatory Visit: Payer: Self-pay

## 2021-04-10 VITALS — BP 134/70 | HR 86 | Ht 65.5 in | Wt 182.6 lb

## 2021-04-10 DIAGNOSIS — Z7689 Persons encountering health services in other specified circumstances: Secondary | ICD-10-CM | POA: Diagnosis not present

## 2021-04-10 DIAGNOSIS — Z6829 Body mass index (BMI) 29.0-29.9, adult: Secondary | ICD-10-CM | POA: Diagnosis not present

## 2021-04-10 DIAGNOSIS — Z23 Encounter for immunization: Secondary | ICD-10-CM

## 2021-04-10 DIAGNOSIS — I10 Essential (primary) hypertension: Secondary | ICD-10-CM

## 2021-04-10 MED ORDER — LOSARTAN POTASSIUM-HCTZ 100-12.5 MG PO TABS: 1.0000 | ORAL_TABLET | Freq: Every day | ORAL | 1 refills | Status: DC

## 2021-04-10 MED ORDER — AMLODIPINE BESYLATE 5 MG PO TABS: 5.0000 mg | ORAL_TABLET | Freq: Every day | ORAL | 1 refills | Status: DC

## 2021-04-10 NOTE — Progress Notes (Signed)
Worthy Keeler, DNP, AGNP-c Primary Care Services ______________________________________________________________________________________________________________________________________________  HPI Kristina Sanders is a 60 y.o. female presenting to [facility] today to establish care.   Patient Care Team: Jacelyn Pi, Lilia Argue, MD as PCP - General (Family Medicine) Megan Salon, MD as Consulting Physician (Gynecology) Harold Hedge, Darrick Grinder, MD (Allergy and Immunology) Truitt Merle, MD as Consulting Physician (Hematology) Jovita Kussmaul, MD as Consulting Physician (General Surgery) Eppie Gibson, MD as Attending Physician (Radiation Oncology) Gardenia Phlegm, NP as Nurse Practitioner (Hematology and Oncology)  Last CPE: Fall 2021  Concerns today: . HTN F/U  Health maintenance due:  COVID 4th booster - Cancer Hx, Pre-DM, HTN  Narrative: Kristina Sanders is married and has 3 adult children.  She reports she is safe in his current relationships and home environment.  She does not have a history or partner abuse.  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  She denies nicotine use, denies recreational drugs, admits to alcohol consumption a few times a week. . She is currently sexually active with no concerns for STD's.  She reports last menstrual period about the age of 61.  She denies recent changes to bowel habits, denies recent changes to bladder habits, reports recent changes to skin- mole removed from right temple about 3 weeks ago with pre-cancerous cells.  She denies recent mood related changes. PHQ and GAD listed below.  PHQ9 Today: Depression screen [facility] 2/9 08/16/2020 02/15/2020 07/11/2019  Decreased Interest 0 0 0  Down, Depressed, Hopeless 0 0 0  PHQ - 2 Score 0 0 0   GAD7 Today: No flowsheet data found.  Health Maintenance Due  Topic  Date Due  . Zoster Vaccines- Shingrix (1 of 2) 08/11/2011  . COVID-19 Vaccine (4 - Booster for [employer] series) 12/27/2020     PMH Past Medical History:  Diagnosis Date  . Allergy   . Anemia    hx  . GERD (gastroesophageal reflux disease)   . History of abnormal cervical Pap smear   . HSV infection    h/o  . HTN (hypertension)   . Malignant neoplasm of upper-inner quadrant of left breast in female, estrogen receptor positive ([place]) 04/22/2017  . Ovarian cyst    h/o  . Perennial allergic rhinitis    [place]   . Seasonal allergic rhinitis    [place]    ROS All review of systems negative except what is listed in the HPI  PHYSICAL EXAM General appearance: alert, cooperative, appears stated age and no distress, Resp: clear to auscultation bilaterally and normal percussion bilaterally, Cardio: regular rate and rhythm, S1, S2 normal, no murmur, click, rub or gallop and normal apical impulse, GI: soft, non-tender; bowel sounds normal; no masses,  no organomegaly and Extremities: extremities normal, atraumatic, no cyanosis or edema   ASSESSMENT AND PLAN Problem List Items Addressed This Visit    Essential hypertension    BP well controlled on current regimen No alarm signs present today No changes to medications or plan of care Plan to CPE with f/U in 6 months      Relevant Medications   amLODipine (NORVASC) 5 MG tablet   losartan-hydrochlorothiazide (HYZAAR) 100-12.5 MG tablet   Encounter to establish care - Primary    Review of current and past medical history, social history, medication, and family history.  Review of care gaps and health maintenance recommendations.  Records from recent providers to be requested if not available in Chart Review or Care Everywhere.  Recommendations for health maintenance, diet, and exercise provided.         Other Visit Diagnoses    Body mass index 29.0-29.9, adult          Education provided today during visit and on AVS for patient  to review at home.  Diet and Exercise recommendations provided.  Current diagnoses and recommendations discussed. HM recommendations reviewed with recommendations.    Outpatient Encounter Medications as of 04/10/2021  Medication Sig  . anastrozole (ARIMIDEX) 1 MG tablet TAKE 1 TABLET(1 MG) BY MOUTH DAILY  . famotidine (PEPCID) 20 MG tablet Take 20 mg by mouth daily as needed for heartburn or indigestion.  . fexofenadine (ALLEGRA) 180 MG tablet Take 180 mg by mouth as needed.   . IBUPROFEN PO Take by mouth as needed.  . valACYclovir (VALTREX) 500 MG tablet Take by mouth.  . [DISCONTINUED] amLODipine (NORVASC) 5 MG tablet Take 1 tablet (5 mg total) by mouth daily.  . [DISCONTINUED] losartan-hydrochlorothiazide (HYZAAR) 100-12.5 MG tablet Take 1 tablet by mouth daily.  Marland Kitchen amLODipine (NORVASC) 5 MG tablet Take 1 tablet (5 mg total) by mouth daily.  Marland Kitchen losartan-hydrochlorothiazide (HYZAAR) 100-12.5 MG tablet Take 1 tablet by mouth daily.  . [DISCONTINUED] nitrofurantoin, macrocrystal-monohydrate, (MACROBID) 100 MG capsule Take 1 tab po as needed for UTI prevention (Patient not taking: Reported on 04/10/2021)   No facility-administered encounter medications on file as of 04/10/2021.    Return in about 6 months (around 10/11/2021) for CPE with labs.  Time: 45 minutes, >50% spent counseling, care coordination, chart review, and documentation.   Orma Render, DNP, AGNP-c

## 2021-04-10 NOTE — Patient Instructions (Signed)
Recommendations from today's visit: . Your annual physical exam is not due until October. You may schedule this today or later in the year, if you would like.  . I recommend a 4th COVID booster- having a history of cancer, high blood pressure, and pre-diabetes leaves you more vulnerable to severe illness from COVID-19.  Information on diet, exercise, and health maintenance recommendations are listed below. This is information to help you be sure you are on track for optimal health and monitoring.   Please look over this and let us know if you have any questions or if you have completed any of the health maintenance outside of [facility] so that we can be sure your records are up to date.  ___________________________________________________________  Thank you for choosing [place] [facility] for your Primary Care needs. I am excited for the opportunity to partner with you to meet your health care goals. It was a pleasure meeting you today!  I am an Adult-Geriatric Nurse Practitioner with a background in caring for patients for more than 20 years. I received my Paediatric nurse in Nursing and my Doctor of Nursing Practice degrees at [employer]. I received additional fellowship training in primary care and sports medicine after receiving my doctorate degree. I provide primary care and sports medicine services to patients age 58 and older within this office. I am also a provider with the [facility] and the director of the APP Fellowship with [facility].  I am a [place] native, but have called the [place] area home for nearly 20 years and am proud to be a member of this community.   I am passionate about providing the best service to you through preventive medicine and supportive care. I consider you a part of the medical team and value your input. I work diligently to ensure that you are heard and your needs are met in a safe  and effective manner. I want you to feel comfortable with me as your provider and want you to know that your health concerns are important to me.   For your information, our office hours are Monday- Friday 8:00 AM - 5:00 PM At this time I am not in the office on Wednesdays.  If you have questions or concerns, please call our office at [phone] or send [place] a MyChart message and we will respond as quickly as possible.   For all urgent or time sensitive needs we ask that you please call the office to avoid delays. MyChart is not constantly monitored and replies may take up to 72 business hours.  MyChart Policy: . MyChart allows for you to see your visit notes, after visit summary, provider recommendations, lab and tests results, make an appointment, request refills, and contact your provider or the office for non-urgent questions or concerns.  . Providers are seeing patients during normal business hours and do not have built in time to review MyChart messages. We ask that you allow a minimum of 72 business hours for MyChart message responses.  . Complex MyChart concerns may require a visit. Your provider may request you schedule a virtual or in person visit to ensure we are providing the best care possible. . MyChart messages sent after 4:00 PM on Friday will not be received by the provider until Monday morning.    Lab and Test Results: . You will receive your lab and test results on MyChart as soon as they are completed and  results have been sent by the lab or testing facility. Due to this service, you will receive your results BEFORE your provider.  . Please allow a minimum of 72 business hours for your provider to receive and review lab and test results and contact you about.   . Most lab and test result comments from the provider will be sent through [place]. Your provider may recommend changes to the plan of care, follow-up visits, repeat testing, ask questions, or request an office visit to  discuss these results. You may reply directly to this message or call the office at [phone] to provide information for the provider or set up an appointment. . In some instances, you will be called with test results and recommendations. Please let us know if this is preferred and we will make note of this in your chart to provide this for you.    . If you have not heard a response to your lab or test results in 72 business hours, please call the office to let us know.   After Hours: . For all non-emergency after hours needs, please call the office at [phone] and select the option to reach the on-call provider service. On-call services are shared between multiple [facility] offices and therefore it will not be possible to speak directly with your provider. On-call providers may provide medical advice and recommendations, but are unable to provide refills for maintenance medications.  . For all emergency or urgent medical needs after normal business hours, we recommend that you seek care at the closest Urgent Care or Emergency Department to ensure appropriate treatment in a timely manner.  [facility] [place] [facility] has a 24 hour emergency room located on the ground floor for your convenience.    Please do not hesitate to reach out to [place] with concerns.   Thank you, again, for choosing me as your health care partner. I appreciate your trust and look forward to learning more about you.   Worthy Keeler, DNP, AGNP-c ___________________________________________________________  Health Maintenance Recommendations Screening Testing  Mammogram  Every 1 -2 years based on history and risk factors  Starting at age 58  Pap Smear  Ages 21-39 every 3 years  Ages 23-65 every 5 years with HPV testing  More frequent testing may be required based on results and history  Colon Cancer Screening  Every 1-10 years based on test performed, risk factors, and history  Starting at age  55  Bone Density Screening  Every 2-10 years based on history  Starting at age 80 for women  Recommendations for men differ based on medication usage, history, and risk factors  AAA Screening  One time ultrasound  Men 107-5 years old who have every smoked  Lung Cancer Screening  Low Dose Lung CT every 12 months  Age 20-80 years with a 30 pack-year smoking history who still smoke or who have quit within the last 15 years  Screening Labs  Routine  Labs: Complete Blood Count (CBC), Complete Metabolic Panel (CMP), Cholesterol (Lipid Panel)  Every 6-12 months based on history and medications  May be recommended more frequently based on current conditions or previous results  Hemoglobin A1c Lab  Every 3-12 months based on history and previous results  Starting at age 73 or earlier with diagnosis of diabetes, high cholesterol, BMI >26, and/or risk factors  Frequent monitoring for patients with diabetes to ensure blood sugar control  Thyroid Panel (TSH w/ T3 & T4)  Every 6 months based  on history, symptoms, and risk factors  May be repeated more often if on medication  HIV  One time testing for all patients 77 and older  May be repeated more frequently for patients with increased risk factors or exposure  Hepatitis C  One time testing for all patients 55 and older  May be repeated more frequently for patients with increased risk factors or exposure  Gonorrhea, Chlamydia  Every 12 months for all sexually active persons 13-24 years  Additional monitoring may be recommended for those who are considered high risk or who have symptoms  PSA  Men 43-31 years old with risk factors  Additional screening may be recommended from age 41-69 based on risk factors, symptoms, and history  Vaccine Recommendations  Tetanus Booster  All adults every 10 years  Flu Vaccine  All patients 6 months and older every year  COVID Vaccine  All patients 12 years and  older  Initial dosing with booster  May recommend additional booster based on age and health history  HPV Vaccine  2 doses all patients age 27-26  Dosing may be considered for patients over 26  Shingles Vaccine (Shingrix)  2 doses all adults 59 years and older  Pneumonia (Pneumovax 23)  All adults 4 years and older  May recommend earlier dosing based on health history  Pneumonia (Prevnar 83)  All adults 74 years and older  Dosed 1 year after Pneumovax 23  Additional Screening, Testing, and Vaccinations may be recommended on an individualized basis based on family history, health history, risk factors, and/or exposure.  __________________________________________________________  Diet Recommendations for All Patients  I recommend that all patients maintain a diet low in saturated fats, carbohydrates, and cholesterol. While this can be challenging at first, it is not impossible and small changes can make big differences.  Things to try: [place] [employer] Decreasing the amount of soda, sweet tea, and/or juice to one or less per day and replace with water o While water is always the first choice, if you do not like water you may consider - adding a water additive without sugar to improve the taste - other sugar free drinks . Replace potatoes with a brightly colored vegetable at dinner . Use healthy oils, such as canola oil or olive oil, instead of butter or hard margarine . Limit your bread intake to two pieces or less a day . Replace regular pasta with low carb pasta options . Bake, broil, or grill foods instead of frying . Monitor portion sizes  . Eat smaller, more frequent meals throughout the day instead of large meals  An important thing to remember is, if you love foods that are not great for your health, you don't have to give them up completely. Instead, allow these foods to be a reward when you have done well. Allowing yourself to still have special treats every once in a while is  a nice way to tell yourself thank you for working hard to keep yourself healthy.   Also remember that every day is a new day. If you have a bad day and "fall off the wagon", you can still climb right back up and keep moving along on your journey!  We have resources available to help you!  Some websites that may be helpful include: . www.http://carter.biz/  . Www.VeryWellFit.com _____________________________________________________________  Activity Recommendations for All Patients  I recommend that all adults get at least 20 minutes of moderate physical activity that elevates your heart rate at least 5 days out  of the week.  Some examples include: . Walking or jogging at a pace that allows you to carry on a conversation . Cycling (stationary bike or outdoors) . Water aerobics . Yoga . Weight lifting . Dancing If physical limitations prevent you from putting stress on your joints, exercise in a pool or seated in a chair are excellent options.  Do determine your MAXIMUM heart rate for activity: YOUR AGE - 220 = MAX HeartRate   Remember! . Do not push yourself too hard.  . Start slowly and build up your pace, speed, weight, time in exercise, etc.  . Allow your body to rest between exercise and get good sleep. . You will need more water than normal when you are exerting yourself. Do not wait until you are thirsty to drink. Drink with a purpose of getting in at least 8, 8 ounce glasses of water a day plus more depending on how much you exercise and sweat.    If you begin to develop dizziness, chest pain, abdominal pain, jaw pain, shortness of breath, headache, vision changes, lightheadedness, or other concerning symptoms, stop the activity and allow your body to rest. If your symptoms are severe, seek emergency evaluation immediately. If your symptoms are concerning, but not severe, please let us know so that we can recommend further evaluation.    ________________________________________________________________

## 2021-04-11 DIAGNOSIS — Z7689 Persons encountering health services in other specified circumstances: Secondary | ICD-10-CM | POA: Insufficient documentation

## 2021-04-11 DIAGNOSIS — Z0000 Encounter for general adult medical examination without abnormal findings: Secondary | ICD-10-CM | POA: Insufficient documentation

## 2021-04-11 NOTE — Assessment & Plan Note (Signed)
Review of current and past medical history, social history, medication, and family history.  Review of care gaps and health maintenance recommendations.  Records from recent providers to be requested if not available in Chart Review or Care Everywhere.  Recommendations for health maintenance, diet, and exercise provided.   

## 2021-04-11 NOTE — Assessment & Plan Note (Signed)
BP well controlled on current regimen No alarm signs present today No changes to medications or plan of care Plan to CPE with f/U in 6 months

## 2021-08-19 ENCOUNTER — Other Ambulatory Visit (HOSPITAL_BASED_OUTPATIENT_CLINIC_OR_DEPARTMENT_OTHER): Payer: BC Managed Care – PPO

## 2021-08-19 ENCOUNTER — Encounter (HOSPITAL_BASED_OUTPATIENT_CLINIC_OR_DEPARTMENT_OTHER): Payer: BC Managed Care – PPO | Admitting: Nurse Practitioner

## 2021-09-11 ENCOUNTER — Other Ambulatory Visit (HOSPITAL_COMMUNITY)
Admission: RE | Admit: 2021-09-11 | Discharge: 2021-09-11 | Disposition: A | Discharge status: Home / Self Care | Payer: BC Managed Care – PPO | Source: Ambulatory Visit | Attending: Obstetrics & Gynecology | Admitting: Obstetrics & Gynecology

## 2021-09-11 ENCOUNTER — Ambulatory Visit (INDEPENDENT_AMBULATORY_CARE_PROVIDER_SITE_OTHER): Payer: BC Managed Care – PPO | Admitting: Obstetrics & Gynecology

## 2021-09-11 ENCOUNTER — Ambulatory Visit (INDEPENDENT_AMBULATORY_CARE_PROVIDER_SITE_OTHER): Payer: BC Managed Care – PPO | Admitting: Nurse Practitioner

## 2021-09-11 ENCOUNTER — Encounter (HOSPITAL_BASED_OUTPATIENT_CLINIC_OR_DEPARTMENT_OTHER): Payer: Self-pay | Admitting: Obstetrics & Gynecology

## 2021-09-11 ENCOUNTER — Encounter (HOSPITAL_BASED_OUTPATIENT_CLINIC_OR_DEPARTMENT_OTHER): Payer: Self-pay | Admitting: Nurse Practitioner

## 2021-09-11 ENCOUNTER — Other Ambulatory Visit: Payer: Self-pay

## 2021-09-11 ENCOUNTER — Other Ambulatory Visit (HOSPITAL_BASED_OUTPATIENT_CLINIC_OR_DEPARTMENT_OTHER): Payer: BC Managed Care – PPO

## 2021-09-11 VITALS — BP 139/85 | HR 90 | Ht 65.5 in | Wt 169.6 lb

## 2021-09-11 VITALS — BP 144/74 | HR 83 | Ht 65.5 in | Wt 171.0 lb

## 2021-09-11 DIAGNOSIS — Z8742 Personal history of other diseases of the female genital tract: Secondary | ICD-10-CM

## 2021-09-11 DIAGNOSIS — I10 Essential (primary) hypertension: Secondary | ICD-10-CM

## 2021-09-11 DIAGNOSIS — C50212 Malignant neoplasm of upper-inner quadrant of left female breast: Secondary | ICD-10-CM

## 2021-09-11 DIAGNOSIS — B009 Herpesviral infection, unspecified: Secondary | ICD-10-CM | POA: Diagnosis not present

## 2021-09-11 DIAGNOSIS — Z170 Estrogen receptor positive status [ER+]: Secondary | ICD-10-CM | POA: Diagnosis not present

## 2021-09-11 DIAGNOSIS — Z01419 Encounter for gynecological examination (general) (routine) without abnormal findings: Secondary | ICD-10-CM | POA: Diagnosis not present

## 2021-09-11 DIAGNOSIS — Z130 Encounter for screening for diseases of the blood and blood-forming organs and certain disorders involving the immune mechanism: Secondary | ICD-10-CM

## 2021-09-11 DIAGNOSIS — N390 Urinary tract infection, site not specified: Secondary | ICD-10-CM

## 2021-09-11 DIAGNOSIS — Z124 Encounter for screening for malignant neoplasm of cervix: Secondary | ICD-10-CM | POA: Diagnosis not present

## 2021-09-11 DIAGNOSIS — Z0000 Encounter for general adult medical examination without abnormal findings: Secondary | ICD-10-CM | POA: Diagnosis not present

## 2021-09-11 DIAGNOSIS — Z1329 Encounter for screening for other suspected endocrine disorder: Secondary | ICD-10-CM

## 2021-09-11 DIAGNOSIS — K219 Gastro-esophageal reflux disease without esophagitis: Secondary | ICD-10-CM

## 2021-09-11 DIAGNOSIS — R7303 Prediabetes: Secondary | ICD-10-CM

## 2021-09-11 DIAGNOSIS — Z87440 Personal history of urinary (tract) infections: Secondary | ICD-10-CM | POA: Diagnosis not present

## 2021-09-11 DIAGNOSIS — Z13220 Encounter for screening for lipoid disorders: Secondary | ICD-10-CM

## 2021-09-11 DIAGNOSIS — J302 Other seasonal allergic rhinitis: Secondary | ICD-10-CM

## 2021-09-11 DIAGNOSIS — Z13228 Encounter for screening for other metabolic disorders: Secondary | ICD-10-CM

## 2021-09-11 DIAGNOSIS — Z1321 Encounter for screening for nutritional disorder: Secondary | ICD-10-CM

## 2021-09-11 DIAGNOSIS — Z79899 Other long term (current) drug therapy: Secondary | ICD-10-CM

## 2021-09-11 MED ORDER — NITROFURANTOIN MONOHYD MACRO 100 MG PO CAPS: ORAL_CAPSULE | ORAL | 1 refills | Status: DC

## 2021-09-11 MED ORDER — FAMOTIDINE 20 MG PO TABS: 20.0000 mg | ORAL_TABLET | Freq: Every day | ORAL | 3 refills | Status: DC | PRN

## 2021-09-11 MED ORDER — VALACYCLOVIR HCL 1 G PO TABS: ORAL_TABLET | ORAL | 1 refills | Status: DC

## 2021-09-11 MED ORDER — AMLODIPINE BESYLATE 5 MG PO TABS: 5.0000 mg | ORAL_TABLET | Freq: Every day | ORAL | 3 refills | Status: DC

## 2021-09-11 MED ORDER — LOSARTAN POTASSIUM-HCTZ 100-12.5 MG PO TABS: 1.0000 | ORAL_TABLET | Freq: Every day | ORAL | 3 refills | Status: DC

## 2021-09-11 MED ORDER — FEXOFENADINE HCL 180 MG PO TABS: 180.0000 mg | ORAL_TABLET | ORAL | 3 refills | Status: DC | PRN

## 2021-09-11 NOTE — Patient Instructions (Signed)
Recommendations from today's visit: I have sent refills on your medication to the pharmacy for the next year.  Please let me know if you have any concerns over the next year.  I have included some information on memory loss/alzheimer's and care giver for you to look over. Please feel free to reach out with any questions or concerns.

## 2021-09-11 NOTE — Progress Notes (Addendum)
BP 139/85   Pulse 90   Ht 5' 5.5" (1.664 m)   Wt 169 lb 9.6 oz (76.9 kg)   LMP 07/17/2013   SpO2 100%   BMI 27.79 kg/m    Subjective:    Patient ID: Kristina Sanders, female    DOB: 05-May-1961, 60 y.o.   MRN: [id number]  HPI: Kristina Sanders is a 60 y.o. female presenting on 09/11/2021 for comprehensive medical examination.   Current medical concerns include:none  Past Medical History:  Past Medical History:  Diagnosis Date   Allergy    Anemia    hx   GERD (gastroesophageal reflux disease)    History of abnormal cervical Pap smear    HSV infection    h/o   HTN (hypertension)    Malignant neoplasm of upper-inner quadrant of left breast in female, estrogen receptor positive ([place]) 04/22/2017   Ovarian cyst    h/o   Perennial allergic rhinitis    [facility]    Seasonal allergic rhinitis    [place]   Medications:  Current Outpatient Medications on File Prior to Visit  Medication Sig   anastrozole (ARIMIDEX) 1 MG tablet TAKE 1 TABLET(1 MG) BY MOUTH DAILY   IBUPROFEN PO Take by mouth as needed.   valACYclovir (VALTREX) 500 MG tablet Take by mouth.   No current facility-administered medications on file prior to visit.    She currently lives with: her husband and her 2 dogs.  Interim Problems from her last visit: no   She reports regular vision exams q1-5y: yes: Last eye exam 05/2021 She reports regular dental exams q 60m: yes  Last exam 06/2021 Her diet consists of:  Overall healthy, home cooked options. Avoid sugary foods, but does eat more carbs that she feels like she should. Husband is an excellent cook and she does eat rich sauces on a regular basis. She loves vegetables and fruits.  She endorses exercise and/or activity of:  Not much outside of everyday walking at work (7-8,000 steps minimum). Helping to care for her elderly parents in the evenings and this limits her time for routine physical activity right now.  She works at:  Market researcher at [employer]  She denies ETOH use  She denies nictoine use She denies illegal substance use   She is postmenopausal She reports no irregular bleeding or concerning findings. GYN appt is later this afternoon.  Current menopausal symptoms: No concerning symptoms at this time  She is currently sexually active with her husband in monogamous relationship with no concerns about STI  She denies concerns about skin changes today:  She denies concerns about bowel changes today:  She denies concerns about bladder changes today:   She has questions about prevention of dementia today. Her mother has Alzheimer's and is currently residing in a memory care facility. Her father lives in independent care in the facility, but is showing signs of some dementia and requires some daily help. She has had her COVID vaccine with 2 boosters.  She has had her shingles vaccine She is waiting on her flu vaccine until next week, as she had a cold recently.  She is UTD on mammogram and followed by Dr. Burr Medico with oncology annual in March for hx of Br. Ca She has had DEXA scanning done, which showed osteopenia She is UTD on pap- sees Dr. Sabra Heck She is UTD on colorectal ca screening  Sees Dr. Herminio Heads for Dermatology Sees Dr Valetta Close for eye care   Surgical History:  Past Surgical History:  Procedure Laterality Date   ADENOIDECTOMY     only adenoids still have tonsils   BREAST LUMPECTOMY WITH RADIOACTIVE SEED AND SENTINEL LYMPH NODE BIOPSY Left 05/17/2017   Procedure: LEFT BREAST LUMPECTOMY WITH RADIOACTIVE SEED AND SENTINEL LYMPH NODE BIOPSY;  Surgeon: Jovita Kussmaul, MD;  Location: [place] [facility];  Service: General;  Laterality: Left;   CESAREAN SECTION     x3   excision of precancerous mole     lft arm, chest    Allergies:  No Known Allergies  Social History:  Social History   Socioeconomic History   Marital status: Married    Spouse name: Not on file   Number of children: 3   Years of education: Not on file    Highest education level: Not on file  Occupational History   Not on file  Tobacco Use   Smoking status: Never   Smokeless tobacco: Never  Vaping Use   Vaping Use: Never used  Substance and Sexual Activity   Alcohol use: Yes    Alcohol/week: 2.0 standard drinks    Types: 2 Standard drinks or equivalent per week   Drug use: No   Sexual activity: Yes    Partners: Male    Birth control/protection: Other-see comments    Comment: vasectomy  Other Topics Concern   Not on file  Social History Narrative   Not on file   Social Determinants of Health   Financial Resource Strain: Not on file  Food Insecurity: Not on file  Transportation Needs: Not on file  Physical Activity: Not on file  Stress: Not on file  Social Connections: Not on file  Intimate Partner Violence: Not on file   Social History   Tobacco Use  Smoking Status Never  Smokeless Tobacco Never   Social History   Substance and Sexual Activity  Alcohol Use Yes   Alcohol/week: 2.0 standard drinks   Types: 2 Standard drinks or equivalent per week    Family History:  Family History  Problem Relation Age of Onset   Mitral valve prolapse Mother    Alzheimer's disease Mother    Prostate cancer Father    Transient ischemic attack Father    Breast cancer Paternal Aunt 11       bile duct cancer   Colon cancer Neg Hx    Rectal cancer Neg Hx    Stomach cancer Neg Hx    Esophageal cancer Neg Hx     Past medical history, surgical history, medications, allergies, family history and social history reviewed with patient today and changes made to appropriate areas of the chart.   All ROS negative except what is listed above and in the HPI.      Objective:    BP 139/85   Pulse 90   Ht 5' 5.5" (1.664 m)   Wt 169 lb 9.6 oz (76.9 kg)   LMP 07/17/2013   SpO2 100%   BMI 27.79 kg/m   Wt Readings from Last 3 Encounters:  09/11/21 169 lb 9.6 oz (76.9 kg)  04/10/21 182 lb 9.6 oz (82.8 kg)  01/29/21 185 lb 9.6 oz  (84.2 kg)    Physical Exam Vitals and nursing note reviewed.  Constitutional:      General: She is not in acute distress.    Appearance: Normal appearance.  HENT:     Head: Normocephalic and atraumatic.     Right Ear: Hearing, tympanic membrane, ear canal and external ear normal.  Left Ear: Hearing, tympanic membrane, ear canal and external ear normal.     Nose: Nose normal.     Right Sinus: No maxillary sinus tenderness or frontal sinus tenderness.     Left Sinus: No maxillary sinus tenderness or frontal sinus tenderness.     Mouth/Throat:     Lips: Pink.     Mouth: Mucous membranes are moist.     Pharynx: Oropharynx is clear.  Eyes:     General: Lids are normal. Vision grossly intact.     Extraocular Movements: Extraocular movements intact.     Conjunctiva/sclera: Conjunctivae normal.     Pupils: Pupils are equal, round, and reactive to light.     Funduscopic exam:    Right eye: Red reflex present.        Left eye: Red reflex present.    Visual Fields: Right eye visual fields normal and left eye visual fields normal.  Neck:     Thyroid: No thyromegaly.     Vascular: No carotid bruit.  Cardiovascular:     Rate and Rhythm: Normal rate and regular rhythm.     Chest Wall: PMI is not displaced.     Pulses: Normal pulses.          Dorsalis pedis pulses are 2+ on the right side and 2+ on the left side.       Posterior tibial pulses are 2+ on the right side and 2+ on the left side.     Heart sounds: Normal heart sounds. No murmur heard. Pulmonary:     Effort: Pulmonary effort is normal. No respiratory distress.     Breath sounds: Normal breath sounds.  Abdominal:     General: Abdomen is flat. Bowel sounds are normal. There is no distension.     Palpations: Abdomen is soft. There is no hepatomegaly, splenomegaly or mass.     Tenderness: There is no abdominal tenderness. There is no right CVA tenderness, left CVA tenderness, guarding or rebound.  Musculoskeletal:         General: Normal range of motion.     Cervical back: Full passive range of motion without pain, normal range of motion and neck supple. No tenderness.     Right lower leg: No edema.     Left lower leg: No edema.  Feet:     Left foot:     Toenail Condition: Left toenails are normal.  Lymphadenopathy:     Cervical: No cervical adenopathy.     Upper Body:     Right upper body: No supraclavicular adenopathy.     Left upper body: No supraclavicular adenopathy.  Skin:    General: Skin is warm and dry.     Capillary Refill: Capillary refill takes less than 2 seconds.     Nails: There is no clubbing.  Neurological:     General: No focal deficit present.     Mental Status: She is alert and oriented to person, place, and time.     GCS: GCS eye subscore is 4. GCS verbal subscore is 5. GCS motor subscore is 6.     Sensory: Sensation is intact.     Motor: Motor function is intact.     Coordination: Coordination is intact.     Gait: Gait is intact.     Deep Tendon Reflexes: Reflexes are normal and symmetric.  Psychiatric:        Attention and Perception: Attention normal.        Mood and Affect: Mood normal.  Speech: Speech normal.        Behavior: Behavior normal. Behavior is cooperative.        Thought Content: Thought content normal.        Cognition and Memory: Cognition and memory normal.        Judgment: Judgment normal.    Results for orders placed or performed in visit on 01/29/21  Comprehensive metabolic panel  Result Value Ref Range   Sodium 139 135 - 145 mmol/L   Potassium 3.8 3.5 - 5.1 mmol/L   Chloride 102 98 - 111 mmol/L   CO2 28 22 - 32 mmol/L   Glucose, Bld 176 (H) 70 - 99 mg/dL   BUN 12 6 - 20 mg/dL   Creatinine, Ser 0.73 0.44 - 1.00 mg/dL   Calcium 9.5 8.9 - 10.3 mg/dL   Total Protein 7.1 6.5 - 8.1 g/dL   Albumin 4.2 3.5 - 5.0 g/dL   AST 20 15 - 41 U/L   ALT 21 0 - 44 U/L   Alkaline Phosphatase 66 38 - 126 U/L   Total Bilirubin 0.9 0.3 - 1.2 mg/dL   GFR,  Estimated >60 >60 mL/min   Anion gap 9 5 - 15  CBC with Differential  Result Value Ref Range   WBC 7.5 4.0 - 10.5 K/uL   RBC 3.77 (L) 3.87 - 5.11 MIL/uL   Hemoglobin 12.4 12.0 - 15.0 g/dL   HCT 36.3 36.0 - 46.0 %   MCV 96.3 80.0 - 100.0 fL   MCH 32.9 26.0 - 34.0 pg   MCHC 34.2 30.0 - 36.0 g/dL   RDW 11.9 11.5 - 15.5 %   Platelets 237 150 - 400 K/uL   nRBC 0.0 0.0 - 0.2 %   Neutrophils Relative % 67 %   Neutro Abs 5.0 1.7 - 7.7 K/uL   Lymphocytes Relative 22 %   Lymphs Abs 1.7 0.7 - 4.0 K/uL   Monocytes Relative 6 %   Monocytes Absolute 0.5 0.1 - 1.0 K/uL   Eosinophils Relative 4 %   Eosinophils Absolute 0.3 0.0 - 0.5 K/uL   Basophils Relative 1 %   Basophils Absolute 0.1 0.0 - 0.1 K/uL   Immature Granulocytes 0 %   Abs Immature Granulocytes 0.02 0.00 - 0.07 K/uL      Assessment & Plan:   Problem List Items Addressed This Visit     Essential hypertension    BP well controlled on amlodipine and losartan-HCTZ No concerning findings today or red flags Slight elevation in office, likely due to white coat Monitor at home and notify if BP is >130/85 on routine basis Discussed optimal control of HTN, glucose, and Lipids to lower CVD risk and dementia associated.  Labs today- will follow CMP with Dr. Burr Medico in [place].       Relevant Medications   amLODipine (NORVASC) 5 MG tablet   losartan-hydrochlorothiazide (HYZAAR) 100-12.5 MG tablet   Other Relevant Orders   CBC   Comprehensive metabolic panel   Lipid panel   Malignant neoplasm of upper-inner quadrant of left breast in female, estrogen receptor positive ([place])    Followed by Dr. Burr Medico on annual basis. No concerning findings present.  Will monitor basic labs for evaluation due to anastrozole use.       Prediabetes    Historical Will monitor labs today No symptoms or red flags present today Discussed management of HTN, Lipids, and BG for prevention of CVD and associated dementia.  Information provided today  Relevant Orders   CBC   Hemoglobin A1c   Lipid panel   Encounter for annual physical exam - Primary    CPE today with no concerning findings Will monitor labs for further evaluation GYN appt this afternoon.  Education/discussion today on dementia and caregiver burden as well as preventative measures and signs to monitor for.  HM UTD at this time.  She will get her flu vaccine next week.  F/U in 1 year or sooner if needed.       Other Visit Diagnoses     Screening for deficiency anemia       Relevant Orders   CBC   Screening for lipid disorders       Relevant Orders   Lipid panel   Screening for endocrine, nutritional, metabolic and immunity disorder       Relevant Orders   Comprehensive metabolic panel   Hemoglobin A1c   VITAMIN D 25 Hydroxy (Vit-D Deficiency, Fractures)   TSH   High risk medication use       Relevant Orders   CBC   Comprehensive metabolic panel   Lipid panel   VITAMIN D 25 Hydroxy (Vit-D Deficiency, Fractures)   TSH   Gastroesophageal reflux disease, unspecified whether esophagitis present       Relevant Medications   famotidine (PEPCID) 20 MG tablet   Seasonal allergies       Relevant Medications   fexofenadine (ALLEGRA) 180 MG tablet        Follow up plan: Return in about 1 year (around 09/11/2022) for CPE w/Labs.   LABORATORY TESTING:  - Pap smear: up to date - STI testing: deferred  IMMUNIZATIONS:   - Tdap: Tetanus vaccination status reviewed: last tetanus booster within 10 years. - Influenza:  Next week - Pneumovax: Not applicable - Prevnar: Not applicable - HPV: Not applicable - Zostavax vaccine: Up to date  SCREENING: -Mammogram: Up to date  - Colonoscopy: Up to date  - Bone Density: Up to date  -Hearing Test: Not applicable  -Spirometry: Not applicable   PATIENT COUNSELING:   For all adult patients, I recommend   A well balanced diet low in saturated fats, cholesterol, and moderation in carbohydrates.   This can be as  simple as monitoring portion sizes and cutting back on sugary beverages such as soda and  juice to start with.    Daily water consumption of at least 64 ounces.  Physical activity at least 180 minutes per week, if just starting out.   This can be as simple as taking the stairs instead of the elevator and walking 2-3 laps around the office  purposefully every day.   STD protection, partner selection, and regular testing if high risk.  Limited consumption of alcoholic beverages if alcohol is consumed.  For women, I recommend no more than 7 alcoholic beverages per week, spread out throughout the week.  Avoid "binge" drinking or consuming large quantities of alcohol in one setting.   Please let me know if you feel you may need help with reduction or quitting alcohol consumption.   Avoidance of nicotine, if used.  Please let me know if you feel you may need help with reduction or quitting nicotine use.   Daily mental health attention.  This can be in the form of 5 minute daily meditation, prayer, journaling, yoga, reflection, etc.   Purposeful attention to your emotions and mental state can significantly improve your overall wellbeing  and  Health.  Please know that I  am here to help you with all of your health care goals and am happy to work with you to find a solution that works best for you.  The greatest advice I have received with any changes in life are to take it one step at a time, that even means if all you can focus on is the next 60 seconds, then do that and celebrate your victories.  With any changes in life, you will have set backs, and that is OK. The important thing to remember is, if you have a set back, it is not a failure, it is an opportunity to try again!  Health Maintenance Recommendations Screening Testing Mammogram Every 1 -2 years based on history and risk factors Starting at age 50 Pap Smear Ages 21-39 every 3 years Ages 18-65 every 5 years with HPV testing More  frequent testing may be required based on results and history Colon Cancer Screening Every 1-10 years based on test performed, risk factors, and history Starting at age 7 Bone Density Screening Every 2-10 years based on history Starting at age 52 for women Recommendations for men differ based on medication usage, history, and risk factors AAA Screening One time ultrasound Men 32-78 years old who have every smoked Lung Cancer Screening Low Dose Lung CT every 12 months Age 36-80 years with a 30 pack-year smoking history who still smoke or who have quit within the last 15 years  Screening Labs Routine  Labs: Complete Blood Count (CBC), Complete Metabolic Panel (CMP), Cholesterol (Lipid Panel) Every 6-12 months based on history and medications May be recommended more frequently based on current conditions or previous results Hemoglobin A1c Lab Every 3-12 months based on history and previous results Starting at age 43 or earlier with diagnosis of diabetes, high cholesterol, BMI >26, and/or risk factors Frequent monitoring for patients with diabetes to ensure blood sugar control Thyroid Panel (TSH w/ T3 & T4) Every 6 months based on history, symptoms, and risk factors May be repeated more often if on medication HIV One time testing for all patients 98 and older May be repeated more frequently for patients with increased risk factors or exposure Hepatitis C One time testing for all patients 37 and older May be repeated more frequently for patients with increased risk factors or exposure Gonorrhea, Chlamydia Every 12 months for all sexually active persons 13-24 years Additional monitoring may be recommended for those who are considered high risk or who have symptoms PSA Men 58-71 years old with risk factors Additional screening may be recommended from age 33-69 based on risk factors, symptoms, and history  Vaccine Recommendations Tetanus Booster All adults every 10 years Flu  Vaccine All patients 6 months and older every year COVID Vaccine All patients 12 years and older Initial dosing with booster May recommend additional booster based on age and health history HPV Vaccine 2 doses all patients age 79-26 Dosing may be considered for patients over 26 Shingles Vaccine (Shingrix) 2 doses all adults 36 years and older Pneumonia (Pneumovax 23) All adults 8 years and older May recommend earlier dosing based on health history Pneumonia (Prevnar 63) All adults 51 years and older Dosed 1 year after Pneumovax 23  Additional Screening, Testing, and Vaccinations may be recommended on an individualized basis based on family history, health history, risk factors, and/or exposure.      NEXT PREVENTATIVE PHYSICAL DUE IN 1 YEAR. Return in about 1 year (around 09/11/2022) for CPE w/Labs.

## 2021-09-11 NOTE — Assessment & Plan Note (Signed)
CPE today with no concerning findings Will monitor labs for further evaluation GYN appt this afternoon.  Education/discussion today on dementia and caregiver burden as well as preventative measures and signs to monitor for.  HM UTD at this time.  She will get her flu vaccine next week.  F/U in 1 year or sooner if needed.

## 2021-09-11 NOTE — Progress Notes (Signed)
60 y.o. [id number] Married White or Caucasian female here for annual exam.  Doing well.  No vaginal bleeding.  Is 4+ years out from breast cancer diagnosis.    Patient's last menstrual period was 07/17/2013.          Sexually active: Yes.    The current method of family planning is post menopausal status.    Exercising: "trying" Smoker:  no  Health Maintenance: Pap:  09/09/2020 Negative History of abnormal Pap:  yes MMG:  04/[facility] at [facility] Colonoscopy:  02/22/2015, follow 10 years BMD:   02/05/2020, -1.4 Screening Labs: done this morning   reports that she has never smoked. She has never used smokeless tobacco. She reports current alcohol use of about 2.0 standard drinks per week. She reports that she does not use drugs.  Past Medical History:  Diagnosis Date   Allergy    Anemia    hx   GERD (gastroesophageal reflux disease)    History of abnormal cervical Pap smear    HSV infection    h/o   HTN (hypertension)    Malignant neoplasm of upper-inner quadrant of left breast in female, estrogen receptor positive ([place]) 04/22/2017   Ovarian cyst    h/o   Perennial allergic rhinitis    [place]    Seasonal allergic rhinitis    [place]    Past Surgical History:  Procedure Laterality Date   ADENOIDECTOMY     only adenoids still have tonsils   BREAST LUMPECTOMY WITH RADIOACTIVE SEED AND SENTINEL LYMPH NODE BIOPSY Left 05/17/2017   Procedure: LEFT BREAST LUMPECTOMY WITH RADIOACTIVE SEED AND SENTINEL LYMPH NODE BIOPSY;  Surgeon: Autumn Messing III, MD;  Location: [place];  Service: General;  Laterality: Left;   CESAREAN SECTION     x3   excision of precancerous mole     lft arm, chest    Current Outpatient Medications  Medication Sig Dispense Refill   amLODipine (NORVASC) 5 MG tablet Take 1 tablet (5 mg total) by mouth daily. 90 tablet 3   anastrozole (ARIMIDEX) 1 MG tablet TAKE 1 TABLET(1 MG) BY MOUTH DAILY 90 tablet 3   famotidine (PEPCID) 20 MG tablet Take 1 tablet (20 mg total) by mouth  daily as needed for heartburn or indigestion. 90 tablet 3   fexofenadine (ALLEGRA) 180 MG tablet Take 1 tablet (180 mg total) by mouth as needed. 90 tablet 3   IBUPROFEN PO Take by mouth as needed.     losartan-hydrochlorothiazide (HYZAAR) 100-12.5 MG tablet Take 1 tablet by mouth daily. 90 tablet 3   valACYclovir (VALTREX) 1000 MG tablet 2 grams x 1, repeat in 12 hours.  Use with symptom onset. 30 tablet 1   nitrofurantoin, macrocrystal-monohydrate, (MACROBID) 100 MG capsule Take 1 tab daily prn UTI prophylaxis 30 capsule 1   No current facility-administered medications for this visit.    Family History  Problem Relation Age of Onset   Mitral valve prolapse Mother    Alzheimer's disease Mother    Prostate cancer Father    Transient ischemic attack Father    Breast cancer Paternal Aunt 70       bile duct cancer   Colon cancer Neg Hx    Rectal cancer Neg Hx    Stomach cancer Neg Hx    Esophageal cancer Neg Hx     Review of Systems  All other systems reviewed and are negative.  Exam:   BP (!) 144/74 (BP Location: Right Arm, Patient Position: Sitting, Cuff Size: Normal)  Pulse 83   Ht 5' 5.5" (1.664 m)   Wt 171 lb (77.6 kg)   LMP 07/17/2013   BMI 28.02 kg/m   Height: 5' 5.5" (166.4 cm)  General appearance: alert, cooperative and appears stated age Head: Normocephalic, without obvious abnormality, atraumatic Neck: no adenopathy, supple, symmetrical, trachea midline and thyroid normal to inspection and palpation Lungs: clear to auscultation bilaterally Breasts: normal appearance, no masses or tenderness, well healed breast scars Heart: regular rate and rhythm Abdomen: soft, non-tender; bowel sounds normal; no masses,  no organomegaly Extremities: extremities normal, atraumatic, no cyanosis or edema Skin: Skin color, texture, turgor normal. No rashes or lesions Lymph nodes: Cervical, supraclavicular, and axillary nodes normal. No abnormal inguinal nodes palpated Neurologic:  Grossly normal  Pelvic: External genitalia:  no lesions              Urethra:  normal appearing urethra with no masses, tenderness or lesions              Bartholins and Skenes: normal                 Vagina: normal appearing vagina with normal color and no discharge, no lesions              Cervix: no lesions              Pap taken: Yes.   Bimanual Exam:  Uterus:  normal size, contour, position, consistency, mobility, non-tender              Adnexa: normal adnexa and no mass, fullness, tenderness               Rectovaginal: Confirms               Anus:  normal sphincter tone, no lesions  Chaperone, Octaviano Batty, CMA, was present for exam.  Assessment/Plan: 1. Well woman exam with routine gynecological exam - pap smear obtained today - MMG 04/2021 - Colonoscopy 02/2015.  Repeat 10 years. - BMD 01/2020 - establishing care with new PCP - care gaps updated/reviewed  2. Malignant neoplasm of upper-inner quadrant of left breast in female, estrogen receptor positive ([facility]) - on anastrozole  3. Essential hypertension  4. Prediabetes  5. History of recurrent UTI (urinary tract infection) - nitrofurantoin, macrocrystal-monohydrate, (MACROBID) 100 MG capsule; Take 1 tab daily prn UTI prophylaxis  Dispense: 30 capsule; Refill: 1  6. HSV (herpes simplex virus) infection - valACYclovir (VALTREX) 1000 MG tablet; 2 grams x 1, repeat in 12 hours.  Use with symptom onset.  Dispense: 30 tablet; Refill: 1

## 2021-09-11 NOTE — Assessment & Plan Note (Addendum)
BP well controlled on amlodipine and losartan-HCTZ No concerning findings today or red flags Slight elevation in office, likely due to white coat Monitor at home and notify if BP is >130/85 on routine basis Discussed optimal control of HTN, glucose, and Lipids to lower CVD risk and dementia associated.  Labs today- will follow CMP with Dr. Burr Medico in [place].

## 2021-09-11 NOTE — Assessment & Plan Note (Signed)
Historical Will monitor labs today No symptoms or red flags present today Discussed management of HTN, Lipids, and BG for prevention of CVD and associated dementia.  Information provided today

## 2021-09-11 NOTE — Assessment & Plan Note (Signed)
Followed by Dr. Burr Medico on annual basis. No concerning findings present.  Will monitor basic labs for evaluation due to anastrozole use.

## 2021-09-12 LAB — COMPREHENSIVE METABOLIC PANEL
ALT: 24 IU/L (ref 0–32)
AST: 21 IU/L (ref 0–40)
Albumin/Globulin Ratio: 2.2 (ref 1.2–2.2)
Albumin: 4.8 g/dL (ref 3.8–4.9)
Alkaline Phosphatase: 68 IU/L (ref 44–121)
BUN/Creatinine Ratio: 15 (ref 12–28)
BUN: 9 mg/dL (ref 8–27)
Bilirubin Total: 0.7 mg/dL (ref 0.0–1.2)
CO2: 22 mmol/L (ref 20–29)
Calcium: 10 mg/dL (ref 8.7–10.3)
Chloride: 97 mmol/L (ref 96–106)
Creatinine, Ser: 0.61 mg/dL (ref 0.57–1.00)
Globulin, Total: 2.2 g/dL (ref 1.5–4.5)
Glucose: 99 mg/dL (ref 70–99)
Potassium: 3.8 mmol/L (ref 3.5–5.2)
Sodium: 136 mmol/L (ref 134–144)
Total Protein: 7 g/dL (ref 6.0–8.5)
eGFR: 102 mL/min/{1.73_m2} (ref >59)

## 2021-09-12 LAB — CBC
Hematocrit: 41.4 % (ref 34.0–46.6)
Hemoglobin: 14.1 g/dL (ref 11.1–15.9)
MCH: 33.8 pg — ABNORMAL HIGH (ref 26.6–33.0)
MCHC: 34.1 g/dL (ref 31.5–35.7)
MCV: 99 fL — ABNORMAL HIGH (ref 79–97)
Platelets: 294 10*3/uL (ref 150–450)
RBC: 4.17 x10E6/uL (ref 3.77–5.28)
RDW: 11.1 % — ABNORMAL LOW (ref 11.7–15.4)
WBC: 9.6 10*3/uL (ref 3.4–10.8)

## 2021-09-12 LAB — CYTOLOGY - PAP
Comment: NEGATIVE
Diagnosis: NEGATIVE
High risk HPV: NEGATIVE

## 2021-09-12 LAB — HEMOGLOBIN A1C
Est. average glucose Bld gHb Est-mCnc: 111 mg/dL
Hgb A1c MFr Bld: 5.5 % (ref 4.8–5.6)

## 2021-09-12 LAB — LIPID PANEL
Chol/HDL Ratio: 4 ratio (ref 0.0–4.4)
Cholesterol, Total: 217 mg/dL — ABNORMAL HIGH (ref 100–199)
HDL: 54 mg/dL (ref >39)
LDL Chol Calc (NIH): 147 mg/dL — ABNORMAL HIGH (ref 0–99)
Triglycerides: 90 mg/dL (ref 0–149)
VLDL Cholesterol Cal: 16 mg/dL (ref 5–40)

## 2021-09-12 LAB — VITAMIN D 25 HYDROXY (VIT D DEFICIENCY, FRACTURES): Vit D, 25-Hydroxy: 45.1 ng/mL (ref 30.0–100.0)

## 2021-09-12 LAB — TSH: TSH: 2.23 u[IU]/mL (ref 0.450–4.500)

## 2021-10-24 ENCOUNTER — Encounter (HOSPITAL_BASED_OUTPATIENT_CLINIC_OR_DEPARTMENT_OTHER): Payer: Self-pay | Admitting: *Deleted

## 2021-10-24 DIAGNOSIS — C50212 Malignant neoplasm of upper-inner quadrant of left female breast: Secondary | ICD-10-CM

## 2021-10-24 DIAGNOSIS — Z170 Estrogen receptor positive status [ER+]: Secondary | ICD-10-CM

## 2022-01-19 ENCOUNTER — Telehealth: Payer: Self-pay | Admitting: Hematology

## 2022-01-19 NOTE — Telephone Encounter (Signed)
Rescheduled upcoming appointment per provider's request. Patient is aware of changes. 

## 2022-02-06 ENCOUNTER — Inpatient Hospital Stay: Payer: BC Managed Care – PPO

## 2022-02-06 ENCOUNTER — Inpatient Hospital Stay: Payer: BC Managed Care – PPO | Attending: Hematology

## 2022-02-06 ENCOUNTER — Inpatient Hospital Stay: Payer: BC Managed Care – PPO | Admitting: Hematology

## 2022-02-06 ENCOUNTER — Other Ambulatory Visit: Payer: Self-pay

## 2022-02-06 VITALS — BP 154/78 | HR 73 | Temp 98.4°F | Resp 19 | Ht 65.5 in | Wt 178.5 lb

## 2022-02-06 DIAGNOSIS — M8580 Other specified disorders of bone density and structure, unspecified site: Secondary | ICD-10-CM | POA: Diagnosis not present

## 2022-02-06 DIAGNOSIS — Z79811 Long term (current) use of aromatase inhibitors: Secondary | ICD-10-CM | POA: Diagnosis not present

## 2022-02-06 DIAGNOSIS — I10 Essential (primary) hypertension: Secondary | ICD-10-CM | POA: Insufficient documentation

## 2022-02-06 DIAGNOSIS — Z79899 Other long term (current) drug therapy: Secondary | ICD-10-CM | POA: Insufficient documentation

## 2022-02-06 DIAGNOSIS — Z170 Estrogen receptor positive status [ER+]: Secondary | ICD-10-CM | POA: Insufficient documentation

## 2022-02-06 DIAGNOSIS — Z923 Personal history of irradiation: Secondary | ICD-10-CM | POA: Insufficient documentation

## 2022-02-06 DIAGNOSIS — C50212 Malignant neoplasm of upper-inner quadrant of left female breast: Secondary | ICD-10-CM | POA: Insufficient documentation

## 2022-02-06 DIAGNOSIS — E2839 Other primary ovarian failure: Secondary | ICD-10-CM

## 2022-02-06 LAB — CBC WITH DIFFERENTIAL/PLATELET
Abs Immature Granulocytes: 0.02 10*3/uL (ref 0.00–0.07)
Basophils Absolute: 0 10*3/uL (ref 0.0–0.1)
Basophils Relative: 1 %
Eosinophils Absolute: 0.2 10*3/uL (ref 0.0–0.5)
Eosinophils Relative: 3 %
HCT: 36.2 % (ref 36.0–46.0)
Hemoglobin: 12.4 g/dL (ref 12.0–15.0)
Immature Granulocytes: 0 %
Lymphocytes Relative: 23 %
Lymphs Abs: 1.7 10*3/uL (ref 0.7–4.0)
MCH: 32.3 pg (ref 26.0–34.0)
MCHC: 34.3 g/dL (ref 30.0–36.0)
MCV: 94.3 fL (ref 80.0–100.0)
Monocytes Absolute: 0.6 10*3/uL (ref 0.1–1.0)
Monocytes Relative: 7 %
Neutro Abs: 5.1 10*3/uL (ref 1.7–7.7)
Neutrophils Relative %: 66 %
Platelets: 237 10*3/uL (ref 150–400)
RBC: 3.84 MIL/uL — ABNORMAL LOW (ref 3.87–5.11)
RDW: 11.4 % — ABNORMAL LOW (ref 11.5–15.5)
WBC: 7.7 10*3/uL (ref 4.0–10.5)
nRBC: 0 % (ref 0.0–0.2)

## 2022-02-06 LAB — COMPREHENSIVE METABOLIC PANEL
ALT: 13 U/L (ref 0–44)
AST: 16 U/L (ref 15–41)
Albumin: 4.3 g/dL (ref 3.5–5.0)
Alkaline Phosphatase: 55 U/L (ref 38–126)
Anion gap: 6 (ref 5–15)
BUN: 13 mg/dL (ref 6–20)
CO2: 31 mmol/L (ref 22–32)
Calcium: 9.9 mg/dL (ref 8.9–10.3)
Chloride: 101 mmol/L (ref 98–111)
Creatinine, Ser: 0.62 mg/dL (ref 0.44–1.00)
GFR, Estimated: >60 mL/min (ref >60)
Glucose, Bld: 125 mg/dL — ABNORMAL HIGH (ref 70–99)
Potassium: 3.7 mmol/L (ref 3.5–5.1)
Sodium: 138 mmol/L (ref 135–145)
Total Bilirubin: 0.9 mg/dL (ref 0.3–1.2)
Total Protein: 6.8 g/dL (ref 6.5–8.1)

## 2022-02-06 NOTE — Progress Notes (Signed)
?[facility]   ?[phone] [phone]   ?Clinic Follow up Note  ? ?Patient Care Team: ?Early, Coralee Pesa, NP as PCP - General (Nurse Practitioner) ?Megan Salon, MD as Consulting Physician (Gynecology) ?Harold Hedge, Darrick Grinder, MD (Allergy and Immunology) ?Truitt Merle, MD as Consulting Physician (Hematology) ?Jovita Kussmaul, MD as Consulting Physician (General Surgery) ?Eppie Gibson, MD as Attending Physician (Radiation Oncology) ?Gardenia Phlegm, NP as Nurse Practitioner (Hematology and Oncology) ? ?Date of Service:  02/06/2022 ? ?CHIEF COMPLAINT: f/u of left breast cancer ? ?CURRENT THERAPY:  ?Anastrozole 1 mg daily starting 07/2017 ? ?ASSESSMENT & PLAN:  ?Kristina Sanders is a 61 y.o. post-menopausal female with  ? ?1.  Malignant neoplasm of upper-inner quadrant of left breast in female, pT1cN0M0, stage IA, ER+/PR+/HER2-,  Grade 2 ?-diagnosed 04/2017, s/p lumpectomy and adjuvant radiation. Oncotype RS of 0, low risk. ?-She began adjuvant antiestrogen therapy with anastrozole in 06/2017, tolerating well. Plan to continue for total 5-7 years. She is tolerating well, so we will continue for 2 more years. ?-most recent mammogram on 05/20/21 was benign. She notes she is already scheduled for mammogram this year. ?-she is clinically doing well, no noticeable side effects from anastrozole. Labs overall WNL. Physical exam was unremarkable. There is no clinical concern for recurrence. ?-continue AI and surveillance  ?  ?2. HTN ?-on losartain; f/u with PCP  ?  ?3. Osteopenia  ?-Her 01/2020 DEXA shows osteopenia, overall mostly stable.  ?-continue calcium and vitamin D ?-I ordered repeat to be done with her mammogram in 05/2022. ? ? ?PLAN: ?-continue anastrozole ?-mammogram and DEXA in 05/2022 ?-lab and f/u with NP Lacie in 1 year ? ? ?No problem-specific Assessment & Plan notes found for this encounter. ? ? ?SUMMARY OF ONCOLOGIC HISTORY: ?Oncology History Overview Note  ?Cancer  Staging ?Malignant neoplasm of upper-inner quadrant of left breast in female, estrogen receptor positive ([place]) ?Staging form: Breast, AJCC 8th Edition ?- Clinical stage from 04/19/2017: Stage IA (cT1b, cN0, cM0, G2, ER: Positive, PR: Positive, HER2: Negative) - Signed by Truitt Merle, MD on 04/28/2017 ?- Pathologic stage from 05/17/2017: Stage IA (pT1c, pN0, cM0, G2, ER: Positive, PR: Positive, HER2: Negative) - Signed by Truitt Merle, MD on 07/24/2017 ? ? ?  ?Malignant neoplasm of upper-inner quadrant of left breast in female, estrogen receptor positive ([place])  ?04/15/2017 Mammogram  ? Diagnostic mammogram and ultrasound of left breast:  ?The new 1.2 cm oval solid mass in the left breast 11:00 position, posterior depth, is highly suspicious for malignancy. The grouped mucus of calcium calcifications in the left breast at the 1:00 and 10:00 middle depth are benign. Ultrasound of the left axilla was negative for adenopathy. ?  ?04/19/2017 Receptors her2  ? Estrogen Receptor: 100%, POSITIVE, STRONG STAINING INTENSITY ?Progesterone Receptor: 100%, POSITIVE, STRONG STAINING INTENSITY ?Proliferation Marker Ki67: 30% ?HER2 (-) ?  ?04/19/2017 Initial Biopsy  ? Breast, left, needle core biopsy ?INVASIVE DUCTAL CARCINOMA, GRADE 1 ?DUCTAL CARCINOMA IN SITU, GRADE 1 ?  ?04/22/2017 Initial Diagnosis  ? Malignant neoplasm of upper-inner quadrant of left breast in female, estrogen receptor positive ([place]) ?  ?05/17/2017 Surgery  ? LEFT BREAST LUMPECTOMY WITH RADIOACTIVE SEED AND SENTINEL LYMPH NODE BIOPSY by Dr. Marlou Starks ?  ?05/17/2017 Pathology Results  ? Diagnosis 05/17/17 ?1. Breast, lumpectomy, Left ?INVASIVE DUCTAL CARCINOMA, GRADE 2, SPANNIGN 1.4 CM ?DUCTAL CARCINOMA IN SITU IS PRESENT ?ALL MARGINS OF RESECTION ARE NEGATIVE FOR CARCINOMA ?CARCINOMA IN SITU IS FOCALLY 1.5 MM FROM THE  ANTERIOR INKED MARGIN (FINAL STATUS REF PART 4) ?2. Lymph node, sentinel, biopsy, Left axillary ?ONE LYMPH NODE, NEGATIVE FOR CARCINOMA (0/1) ?3. Lymph node, sentinel, biopsy,  Left axillary ?ONE LYMPH NODE, NEGATIVE FOR CARCINOMA (0/1) ?4. Breast, excision, Left new anterior margin ?SMALL FOCUS OF DUCTAL CARCINOMA IN SITU ?THE CARCINOMA IS 1 MM FROM THE ANTERIOR INKED MARGIN ?5. Breast, excision, Left new medial margin ?BENIGN BREAST TISSUE ?NEGATIVE FOR CARCINOMA ?6. Breast, excision, Left new superior margin ?FIBROCYSTIC CHANGES WITH CALCIFICATIONS ?NEGATIVE FOR CARCINOMA ?7. Breast, excision, Left new lateral margin ?FIBROCYSTIC CHANGES WITH CALCIFICATIONS ?USUAL DUCTAL HYPERPLASIA ?NEGATIVE FOR CARCINOMA ? ? ?  ?05/17/2017 Miscellaneous  ? Oncotype   ?Recurrence score: 0 ?Low Risk ?10-year risk of recurrence of 3% with Tamoxifen alone ?  ?06/24/2017 - 07/23/2017 Radiation Therapy  ? Radiation with Dr. Isidore Moos ?  ?07/2017 -  Anti-estrogen oral therapy  ? Anastrozole 1 mg daily  ? ?  ?04/21/2018 Mammogram  ? 04/21/2018 Mammogram ?Benign ?  ? ? ? ?INTERVAL HISTORY:  ?Kristina Sanders is here for a follow up of breast cancer. She was last seen by NP Lacie a year ago. She presents to the clinic alone. ?She reports she is doing well overall, no new concerns or complaints.  ?  ?All other systems were reviewed with the patient and are negative. ? ?MEDICAL HISTORY:  ?Past Medical History:  ?Diagnosis Date  ? Allergy   ? Anemia   ? hx  ? GERD (gastroesophageal reflux disease)   ? History of abnormal cervical Pap smear   ? HSV infection   ? h/o  ? HTN (hypertension)   ? Malignant neoplasm of upper-inner quadrant of left breast in female, estrogen receptor positive ([place]) 04/22/2017  ? Ovarian cyst   ? h/o  ? Perennial allergic rhinitis   ? [place]   ? Seasonal allergic rhinitis   ? [place]  ? ? ?SURGICAL HISTORY: ?Past Surgical History:  ?Procedure Laterality Date  ? ADENOIDECTOMY    ? only adenoids still have tonsils  ? BREAST LUMPECTOMY WITH RADIOACTIVE SEED AND SENTINEL LYMPH NODE BIOPSY Left 05/17/2017  ? Procedure: LEFT BREAST LUMPECTOMY WITH RADIOACTIVE SEED AND SENTINEL LYMPH NODE BIOPSY;  Surgeon: Jovita Kussmaul, MD;  Location: [facility];  Service: General;  Laterality: Left;  ? CESAREAN SECTION    ? x3  ? excision of precancerous mole    ? lft arm, chest  ? ? ?I have reviewed the social history and family history with the patient and they are unchanged from previous note. ? ?ALLERGIES:  has No Known Allergies. ? ?MEDICATIONS:  ?Current Outpatient Medications  ?Medication Sig Dispense Refill  ? amLODipine (NORVASC) 5 MG tablet Take 1 tablet (5 mg total) by mouth daily. 90 tablet 3  ? anastrozole (ARIMIDEX) 1 MG tablet TAKE 1 TABLET(1 MG) BY MOUTH DAILY 90 tablet 3  ? famotidine (PEPCID) 20 MG tablet Take 1 tablet (20 mg total) by mouth daily as needed for heartburn or indigestion. 90 tablet 3  ? fexofenadine (ALLEGRA) 180 MG tablet Take 1 tablet (180 mg total) by mouth as needed. 90 tablet 3  ? IBUPROFEN PO Take by mouth as needed.    ? losartan-hydrochlorothiazide (HYZAAR) 100-12.5 MG tablet Take 1 tablet by mouth daily. 90 tablet 3  ? nitrofurantoin, macrocrystal-monohydrate, (MACROBID) 100 MG capsule Take 1 tab daily prn UTI prophylaxis 30 capsule 1  ? valACYclovir (VALTREX) 1000 MG tablet 2 grams x 1, repeat in 12 hours.  Use with symptom  onset. 30 tablet 1  ? ?No current facility-administered medications for this visit.  ? ? ?PHYSICAL EXAMINATION: ?ECOG PERFORMANCE STATUS: 0 - Asymptomatic ? ?Vitals:  ? 02/06/22 1107  ?BP: (!) 154/78  ?Pulse: 73  ?Resp: 19  ?Temp: 98.4 ?F (36.9 ?C)  ?SpO2: 98%  ? ?Wt Readings from Last 3 Encounters:  ?02/06/22 178 lb 8 oz (81 kg)  ?09/11/21 171 lb (77.6 kg)  ?09/11/21 169 lb 9.6 oz (76.9 kg)  ?  ? ?GENERAL:alert, no distress and comfortable ?SKIN: skin color, texture, turgor are normal, no rashes or significant lesions ?EYES: normal, Conjunctiva are pink and non-injected, sclera clear  ?NECK: supple, thyroid normal size, non-tender, without nodularity ?LYMPH:  no palpable lymphadenopathy in the cervical, axillary ?LUNGS: clear to auscultation and percussion with normal breathing  effort ?HEART: regular rate & rhythm and no murmurs and no lower extremity edema ?ABDOMEN:abdomen soft, non-tender and normal bowel sounds ?Musculoskeletal:no cyanosis of digits and no clubbing  ?NEURO: alert & ori

## 2022-02-07 ENCOUNTER — Encounter: Payer: Self-pay | Admitting: Hematology

## 2022-03-16 ENCOUNTER — Other Ambulatory Visit: Payer: Self-pay | Admitting: Nurse Practitioner

## 2022-03-16 DIAGNOSIS — Z170 Estrogen receptor positive status [ER+]: Secondary | ICD-10-CM

## 2022-05-25 ENCOUNTER — Encounter (HOSPITAL_BASED_OUTPATIENT_CLINIC_OR_DEPARTMENT_OTHER): Payer: Self-pay | Admitting: *Deleted

## 2022-08-05 ENCOUNTER — Ambulatory Visit: Payer: BC Managed Care – PPO | Admitting: Orthopaedic Surgery

## 2022-08-05 ENCOUNTER — Encounter: Payer: Self-pay | Admitting: Orthopaedic Surgery

## 2022-08-05 DIAGNOSIS — M7751 Other enthesopathy of right foot: Secondary | ICD-10-CM

## 2022-08-05 MED ORDER — METHYLPREDNISOLONE ACETATE 40 MG/ML IJ SUSP
40.0000 mg | INTRAMUSCULAR | Status: AC | PRN
  Administered 2022-08-05: 40 mg

## 2022-08-05 MED ORDER — LIDOCAINE HCL 1 % IJ SOLN
1.0000 mL | INTRAMUSCULAR | Status: AC | PRN
  Administered 2022-08-05: 1 mL

## 2022-08-05 NOTE — Progress Notes (Signed)
Office Visit Note   Patient: Kristina Sanders           Date of Birth: [birth year]           MRN: [id number] Visit Date: 08/05/2022              Requested by: Orma Render, NP [address],  [place] [address] PCP: Orma Render, NP  Chief Complaint  Patient presents with   Right Heel - Pain      HPI: Kristina Sanders is a pleasant 61 year old woman who is a patient of Dr. Rudene Anda.  She has a history of right posterior heel pain.  She was last seen for this in 2021.  An x-ray and an MRI demonstrated some tendinosis and partial tearing in the Achilles tendon but no complete tear.  A lot of her pain was in the retrocalcaneal bursa which by MRI was inflamed.  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  She is going to [place] in the next week and would like to know if she could get an injection.  Assessment & Plan: Visit Diagnoses:  1. Postcalcaneal bursitis of right foot     Plan: We will go forward with an injection today.  She understands this may be a little sore may follow-up as needed.  Also encouraged her to use supportive shoe wear while she is walking.  Would consider repeat MRI scan if no improvement over time  Follow-Up Instructions: Return if symptoms worsen or fail to improve.   Ortho Exam  Patient is alert, oriented, no adenopathy, well-dressed, normal affect, normal respiratory effort. Examination of her right foot no erythema no swelling she has good strength with dorsiflexion plantarflexion eversion inversion.  Achilles tendon is palpable and intact and she has good plantarflexion strength.  No masses.  She does have some pain and fullness over the retrocalcaneal bursa however no erythema.  Mild pain over the peroneal tendons behind the lateral malleolus.  Good strength with eversion  Imaging: No results found. No images are attached to the  encounter.  Labs: Lab Results  Component Value Date   HGBA1C 5.5 09/11/2021   HGBA1C 5.5 08/16/2020   HGBA1C 5.5 02/15/2020     Lab Results  Component Value Date   ALBUMIN 4.3 02/06/2022   ALBUMIN 4.8 09/11/2021   ALBUMIN 4.2 01/29/2021    Lab Results  Component Value Date   MG 1.9 08/16/2020   MG 1.7 07/29/2014   Lab Results  Component Value Date   VD25OH 45.1 09/11/2021    No results found for: "PREALBUMIN"    Latest Ref Rng & Units 02/06/2022   10:52 AM 09/11/2021    9:30 AM 01/29/2021    1:11 PM  CBC EXTENDED  WBC 4.0 - 10.5 K/uL 7.7  9.6  7.5   RBC 3.87 - 5.11 MIL/uL 3.84  4.17  3.77   Hemoglobin 12.0 - 15.0 g/dL 12.4  14.1  12.4   HCT 36.0 - 46.0 % 36.2  41.4  36.3   Platelets 150 - 400 K/uL 237  294  237   NEUT# 1.7 - 7.7 K/uL 5.1   5.0   Lymph# 0.7 - 4.0 K/uL 1.7   1.7      There is no height or weight on file to calculate BMI.  Orders:  Orders Placed This Encounter  Procedures  Medium Joint Inj: R ankle   No orders of the defined types were placed in this encounter.    Procedures: Medium Joint Inj: R ankle on 08/05/2022 9:59 AM Indications: pain and diagnostic evaluation Details: 25 G 1.5 in needle Medications: 1 mL lidocaine 1 %; 40 mg methylPREDNISolone acetate 40 MG/ML Outcome: tolerated well, no immediate complications  After obtaining verbal consent the lateral side of the heel was prepped with alcohol and Betadine.  Retrocalcaneal bursa was identified and 1 cc of lidocaine and 1 cc of Depo-Medrol 40 mg was injected on a 25 cc syringe Band-Aid was applied Procedure, treatment alternatives, risks and benefits explained, specific risks discussed. Consent was given by the patient. Immediately prior to procedure a time out was called to verify the correct patient, procedure, equipment, support staff and site/side marked as required. Patient was prepped and draped in the usual sterile fashion.      Clinical Data: No additional  findings.  ROS:  All other systems negative, except as noted in the HPI. Review of Systems  Objective: Vital Signs: LMP 07/17/2013   Specialty Comments:  No specialty comments available.  PMFS History: Patient Active Problem List   Diagnosis Date Noted   Encounter for annual physical exam 04/11/2021   Retrocalcaneal bursitis 06/11/2020   Achilles tendinitis, right leg 05/16/2020   Prediabetes 06/01/2018   Malignant neoplasm of upper-inner quadrant of left breast in female, estrogen receptor positive ([place]) 04/22/2017   Allergic rhinitis due to other allergen 10/08/2012   Essential hypertension    Past Medical History:  Diagnosis Date   Allergy    Anemia    hx   GERD (gastroesophageal reflux disease)    History of abnormal cervical Pap smear    HSV infection    h/o   HTN (hypertension)    Malignant neoplasm of upper-inner quadrant of left breast in female, estrogen receptor positive ([place]) 04/22/2017   Ovarian cyst    h/o   Perennial allergic rhinitis    [place]    Seasonal allergic rhinitis    [place]    Family History  Problem Relation Age of Onset   Mitral valve prolapse Mother    Alzheimer's disease Mother    Prostate cancer Father    Transient ischemic attack Father    Breast cancer Paternal Aunt 2       bile duct cancer   Colon cancer Neg Hx    Rectal cancer Neg Hx    Stomach cancer Neg Hx    Esophageal cancer Neg Hx     Past Surgical History:  Procedure Laterality Date   ADENOIDECTOMY     only adenoids still have tonsils   BREAST LUMPECTOMY WITH RADIOACTIVE SEED AND SENTINEL LYMPH NODE BIOPSY Left 05/17/2017   Procedure: LEFT BREAST LUMPECTOMY WITH RADIOACTIVE SEED AND SENTINEL LYMPH NODE BIOPSY;  Surgeon: Jovita Kussmaul, MD;  Location: [facility];  Service: General;  Laterality: Left;   CESAREAN SECTION     x3   excision of precancerous mole     lft arm, chest   Social History   Occupational History   Not on file  Tobacco Use   Smoking status: Never    Smokeless tobacco: Never  Vaping Use   Vaping Use: Never used  Substance and Sexual Activity   Alcohol use: Yes    Alcohol/week: 2.0 standard drinks of alcohol    Types: 2 Standard drinks or equivalent per week   Drug use: No   Sexual activity: Yes  Partners: Male    Birth control/protection: Other-see comments    Comment: vasectomy

## 2022-08-15 ENCOUNTER — Other Ambulatory Visit (HOSPITAL_BASED_OUTPATIENT_CLINIC_OR_DEPARTMENT_OTHER): Payer: Self-pay | Admitting: Nurse Practitioner

## 2022-08-15 DIAGNOSIS — J302 Other seasonal allergic rhinitis: Secondary | ICD-10-CM

## 2022-09-14 ENCOUNTER — Encounter (HOSPITAL_BASED_OUTPATIENT_CLINIC_OR_DEPARTMENT_OTHER): Payer: Self-pay | Admitting: Nurse Practitioner

## 2022-09-14 ENCOUNTER — Ambulatory Visit (INDEPENDENT_AMBULATORY_CARE_PROVIDER_SITE_OTHER): Payer: BC Managed Care – PPO | Admitting: Nurse Practitioner

## 2022-09-14 VITALS — BP 137/90 | HR 96 | Ht 65.0 in | Wt 182.0 lb

## 2022-09-14 DIAGNOSIS — K219 Gastro-esophageal reflux disease without esophagitis: Secondary | ICD-10-CM

## 2022-09-14 DIAGNOSIS — I10 Essential (primary) hypertension: Secondary | ICD-10-CM

## 2022-09-14 DIAGNOSIS — I4891 Unspecified atrial fibrillation: Secondary | ICD-10-CM

## 2022-09-14 DIAGNOSIS — I499 Cardiac arrhythmia, unspecified: Secondary | ICD-10-CM

## 2022-09-14 DIAGNOSIS — R7303 Prediabetes: Secondary | ICD-10-CM | POA: Diagnosis not present

## 2022-09-14 DIAGNOSIS — J302 Other seasonal allergic rhinitis: Secondary | ICD-10-CM | POA: Insufficient documentation

## 2022-09-14 DIAGNOSIS — Z23 Encounter for immunization: Secondary | ICD-10-CM | POA: Diagnosis not present

## 2022-09-14 DIAGNOSIS — Z0000 Encounter for general adult medical examination without abnormal findings: Secondary | ICD-10-CM | POA: Diagnosis not present

## 2022-09-14 MED ORDER — FEXOFENADINE HCL 180 MG PO TABS: 180.0000 mg | ORAL_TABLET | ORAL | 3 refills | Status: DC | PRN

## 2022-09-14 MED ORDER — FAMOTIDINE 20 MG PO TABS: 20.0000 mg | ORAL_TABLET | Freq: Every day | ORAL | 3 refills | Status: DC | PRN

## 2022-09-14 MED ORDER — LOSARTAN POTASSIUM-HCTZ 100-12.5 MG PO TABS: 1.0000 | ORAL_TABLET | Freq: Every day | ORAL | 3 refills | Status: DC

## 2022-09-14 MED ORDER — AMLODIPINE BESYLATE 5 MG PO TABS: 5.0000 mg | ORAL_TABLET | Freq: Every day | ORAL | 3 refills | Status: DC

## 2022-09-14 MED ORDER — METOPROLOL TARTRATE 25 MG PO TABS: 25.0000 mg | ORAL_TABLET | Freq: Two times a day (BID) | ORAL | 3 refills | Status: DC

## 2022-09-14 NOTE — Assessment & Plan Note (Signed)
We will monitor labs.

## 2022-09-14 NOTE — Assessment & Plan Note (Signed)
Chronic.  Refill provided today of famotidine.  No alarm symptoms present at this time.

## 2022-09-14 NOTE — Assessment & Plan Note (Signed)
Chronic.  Refill provided on [place].  No alarm symptoms at this time.

## 2022-09-14 NOTE — Assessment & Plan Note (Signed)
Chronic. BP slightly elevated in office today. At home readings reportedly less than 135/90. Recommend monitoring several days a week and notify if consistently > 135/90. No changes in medications today. Labs pending.

## 2022-09-14 NOTE — Assessment & Plan Note (Signed)
Detected on examination.  EKG completed which does show atrial fibrillation.  Please see atrial fibrillation plan for further assessment.

## 2022-09-14 NOTE — Assessment & Plan Note (Signed)
On examination heart rate rapid and irregular.  Patient asymptomatic.  EKG does show the presence of atrial fibrillation.  Rate of 111.  No prior history of atrial fibrillation personally, however, she does have history of this in her mother.  She endorses only intermittent sensations of palpitations otherwise no symptoms at baseline. ChadsVasc Score: 2 Will plan to start metoprolol '25mg'$  BID and daily ASA for management with referral to cardiology. At this time, given her risks and history a joint decision was made to not start anticoagulant therapy and defer this decision to cardiology.  Recommend close monitoring. If unable to get in with cardiology will plan to follow-up in the next 2 weeks.  HR initially 111, but this did come back down to 90's on re-evaluation.

## 2022-09-14 NOTE — Patient Instructions (Addendum)
5-'10mg'$  of melatonin can be helpful for sleep, this can be taken if you wake up in the middle of the night or before bed on an as needed basis.   Afib: I have sent in metoprolol to get you started on to help with your heart rate and the atrial fibrillation. At this time your risk is considered LOW for significant events. I would like to wait to get you started on formal anticoagulation, but do want you to start on an Aspirin '325mg'$  daily. I have sent a referral to cardiology for evaluation and recommendations.

## 2022-09-14 NOTE — Progress Notes (Signed)
BP (!) 137/90   Pulse 96   Ht '5\' 5"'$  (1.651 m)   Wt 182 lb (82.6 kg)   LMP 07/17/2013   SpO2 100%   BMI 30.29 kg/m    Subjective:    Patient ID: Kristina Sanders, female    DOB: 1961/05/12, 61 y.o.   MRN: [id number]  HPI: Kristina Sanders is a 61 y.o. female presenting on 09/14/2022 for comprehensive medical examination.   Current medical concerns include: waking up in the night and unable to fall back asleep. Would like to know if melatonin is safe  She reports regular vision exams q1-5y: yes She reports regular dental exams q 52m yes Her diet consists of:  eating healthier options and plant based foods She endorses exercise and/or activity of:  walking 30-410m days a week She works in:  Retired scEducation officer, museumShe endorses ETOH use ( 4 drinks/wk ) She denies nictoine use  She denies illegal substance use  She is not having menstrual periods.  She  denies abnormal bleeding. She  denies menopausal symptoms.  She is is sexually active. She currently has 1 sexual partners.  She denies concerns today about STI: testing ordered: no  She denies concerns about skin changes today  She denies concerns about bowel changes today  She denies concerns about bladder changes today   Most Recent Depression Screen:     09/11/2021    3:02 PM 09/11/2021    9:26 AM 08/16/2020    8:47 AM 02/15/2020    9:48 AM 07/11/2019    4:52 PM  Depression screen PHQ 2/9  Decreased Interest 0 0 0 0 0  Down, Depressed, Hopeless 0 0 0 0 0  PHQ - 2 Score 0 0 0 0 0   Most Recent Anxiety Screen:      No data to display         Most Recent Fall Screen:    09/14/2022   12:36 PM 09/11/2021    9:26 AM 04/11/2021   12:55 PM 08/16/2020    8:47 AM 02/15/2020    9:47 AM  Fall Risk   Falls in the past year? 0 0 0 0 0  Number falls in past yr: 0 0 0 0   Injury with Fall? 0 0 0 0   Risk for fall due to : No Fall Risks No Fall Risks No Fall Risks No Fall Risks   Follow up Falls evaluation completed  Falls evaluation completed Falls evaluation completed Falls evaluation completed Falls evaluation completed    All ROS negative except what is listed above and in the HPI.   Past medical history, surgical history, medications, allergies, family history and social history reviewed with patient today and changes made to appropriate areas of the chart.  Past Medical History:  Past Medical History:  Diagnosis Date   Allergy    Anemia    hx   GERD (gastroesophageal reflux disease)    History of abnormal cervical Pap smear    HSV infection    h/o   HTN (hypertension)    Malignant ne[place] of upper-inner quadrant of left [place] in [place]   Ovarian cyst    h/o   Perennial allergic rhinitis    [place]    Seasonal allergic rhinitis       Medications:  Current Outpatient Medications on File Prior to Visit  Medication Sig   anastrozole (ARIMIDEX) 1 MG tablet TAKE 1 TABLET(1 MG) BY  MOUTH DAILY   IBUPROFEN PO Take by mouth as needed.   nitrofurantoin, macrocrystal-monohydrate, (MACROBID) 100 MG capsule Take 1 tab daily prn UTI prophylaxis   valACYclovir (VALTREX) 1000 MG tablet 2 grams x 1, repeat in 12 hours.  Use with symptom onset.   No current facility-administered medications on file prior to visit.   Surgical History:  Past Surgical History:  Procedure Laterality Date   ADENOIDECTOMY     only adenoids still have tonsils   BREAST LUMPECTOMY WITH RADIOACTIVE SEED AND SENTINEL LYMPH NODE BIOPSY Left 05/17/2017   Procedure: LEFT BREAST LUMPECTOMY WITH RADIOACTIVE SEED AND SENTINEL LYMPH NODE BIOPSY;  Surgeon: Jovita Kussmaul, MD;  Location: [facility];  Service: General;  Laterality: Left;   CESAREAN SECTION     x3   excision of precancerous mole     lft arm, chest   Allergies:  No Known Allergies Social History:  Social History   Socioeconomic History   Marital status: Married    Spouse name: Not on file   Number of children: 3    Years of education: Not on file   Highest education level: Not on file  Occupational History   Not on file  Tobacco Use   Smoking status: Never   Smokeless tobacco: Never  Vaping Use   Vaping Use: Never used  Substance and Sexual Activity   Alcohol use: Yes    Alcohol/week: 2.0 standard drinks of alcohol    Types: 2 Standard drinks or equivalent per week   Drug use: No   Sexual activity: Yes    Partners: Male    Birth control/protection: Other-see comments    Comment: vasectomy  Other Topics Concern   Not on file  Social History Narrative   Not on file   Social Determinants of Health   Financial Resource Strain: Not on file  Food Insecurity: Not on file  Transportation Needs: Not on file  Physical Activity: Not on file  Stress: Not on file  Social Connections: Not on file  Intimate Partner Violence: Not on file   Social History   Tobacco Use  Smoking Status Never  Smokeless Tobacco Never   Social History   Substance and Sexual Activity  Alcohol Use Yes   Alcohol/week: 2.0 standard drinks of alcohol   Types: 2 Standard drinks or equivalent per week   Family History:  Family History  Problem Relation Age of Onset   Mitral valve prolapse Mother    Alzheimer's disease Mother    Prostate cancer Father    Transient ischemic attack Father    Breast cancer Paternal Aunt 70       bile duct cancer   Colon cancer Neg Hx    Rectal cancer Neg Hx    Stomach cancer Neg Hx    Esophageal cancer Neg Hx        Objective:    BP (!) 137/90   Pulse 96   Ht '5\' 5"'$  (1.651 m)   Wt 182 lb (82.6 kg)   LMP 07/17/2013   SpO2 100%   BMI 30.29 kg/m   Wt Readings from Last 3 Encounters:  09/14/22 182 lb (82.6 kg)  02/06/22 178 lb 8 oz (81 kg)  09/11/21 171 lb (77.6 kg)    Physical Exam Vitals and nursing note reviewed.  Constitutional:      Appearance: Normal appearance.  HENT:     Head: Normocephalic.     Right Ear: Tympanic membrane normal.     Left Ear:  Tympanic  membrane normal.     Nose: Nose normal.     Mouth/Throat:     Mouth: Mucous membranes are moist.     Pharynx: Oropharynx is clear.  Eyes:     Extraocular Movements: Extraocular movements intact.     Pupils: Pupils are equal, round, and reactive to light.  Neck:     Vascular: No carotid bruit.  Cardiovascular:     Rate and Rhythm: Tachycardia present. Rhythm irregular.     Pulses: Normal pulses.     Heart sounds: Normal heart sounds.  Pulmonary:     Effort: Pulmonary effort is normal.     Breath sounds: Normal breath sounds.  Abdominal:     General: Abdomen is flat. Bowel sounds are normal. There is no distension.     Palpations: Abdomen is soft.     Tenderness: There is no abdominal tenderness. There is no right CVA tenderness, left CVA tenderness or guarding.  Musculoskeletal:        General: Normal range of motion.     Cervical back: Normal range of motion and neck supple. No rigidity or tenderness.     Right lower leg: No edema.     Left lower leg: No edema.  Lymphadenopathy:     Cervical: No cervical adenopathy.  Skin:    General: Skin is warm and dry.     Capillary Refill: Capillary refill takes less than 2 seconds.  Neurological:     General: No focal deficit present.     Mental Status: She is alert and oriented to person, place, and time.     Motor: No weakness.  Psychiatric:        Mood and Affect: Mood normal.        Behavior: Behavior normal.        Thought Content: Thought content normal.        Judgment: Judgment normal.     Results for orders placed or performed in visit on 02/06/22  Comprehensive metabolic panel  Result Value Ref Range   Sodium 138 135 - 145 mmol/L   Potassium 3.7 3.5 - 5.1 mmol/L   Chloride 101 98 - 111 mmol/L   CO2 31 22 - 32 mmol/L   Glucose, Bld 125 (H) 70 - 99 mg/dL   BUN 13 6 - 20 mg/dL   Creatinine, Ser 0.62 0.44 - 1.00 mg/dL   Calcium 9.9 8.9 - 10.3 mg/dL   Total Protein 6.8 6.5 - 8.1 g/dL   Albumin 4.3 3.5 - 5.0 g/dL    AST 16 15 - 41 U/L   ALT 13 0 - 44 U/L   Alkaline Phosphatase 55 38 - 126 U/L   Total Bilirubin 0.9 0.3 - 1.2 mg/dL   GFR, Estimated >60 >60 mL/min   Anion gap 6 5 - 15  CBC with Differential  Result Value Ref Range   WBC 7.7 4.0 - 10.5 K/uL   RBC 3.84 (L) 3.87 - 5.11 MIL/uL   Hemoglobin 12.4 12.0 - 15.0 g/dL   HCT 36.2 36.0 - 46.0 %   MCV 94.3 80.0 - 100.0 fL   MCH 32.3 26.0 - 34.0 pg   MCHC 34.3 30.0 - 36.0 g/dL   RDW 11.4 (L) 11.5 - 15.5 %   Platelets 237 150 - 400 K/uL   nRBC 0.0 0.0 - 0.2 %   Neutrophils Relative % 66 %   Neutro Abs 5.1 1.7 - 7.7 K/uL   Lymphocytes Relative 23 %   Lymphs Abs  1.7 0.7 - 4.0 K/uL   Monocytes Relative 7 %   Monocytes Absolute 0.6 0.1 - 1.0 K/uL   Eosinophils Relative 3 %   Eosinophils Absolute 0.2 0.0 - 0.5 K/uL   Basophils Relative 1 %   Basophils Absolute 0.0 0.0 - 0.1 K/uL   Immature Granulocytes 0 %   Abs Immature Granulocytes 0.02 0.00 - 0.07 K/uL    MMUNIZATIONS:   - Tdap: Tetanus vaccination status reviewed: last tetanus booster within 10 years. - Influenza: Declined - Pneumovax: Not applicable - Prevnar: Not applicable - HPV: Not applicable - Zostavax vaccine:  UTD  SCREENING COMPLETED: - Pap smear:  Seeing GYN this week - STI testing:Declined -Mammogram:  UTD - Colonoscopy: { UTD - Bone Density: {Not Applicable -Hearing Test: Not Applicable -Spirometry: Not Applicable     Assessment & Plan:   Problem List Items Addressed This Visit     Essential hypertension    Chronic. BP slightly elevated in office today. At home readings reportedly less than 135/90. Recommend monitoring several days a week and notify if consistently > 135/90. No changes in medications today. Labs pending.       Relevant Medications   amLODipine (NORVASC) 5 MG tablet   losartan-hydrochlorothiazide (HYZAAR) 100-12.5 MG tablet   famotidine (PEPCID) 20 MG tablet   fexofenadine (ALLERGY RELIEF) 180 MG tablet   metoprolol tartrate (LOPRESSOR) 25  MG tablet   Other Relevant Orders   B12 and Folate Panel   CBC With Diff/Platelet   Comprehensive metabolic panel   Hemoglobin A1c   VITAMIN D 25 Hydroxy (Vit-D Deficiency, Fractures)   Lipid panel   TSH   T4, free   Prediabetes    We will monitor labs.      Relevant Medications   amLODipine (NORVASC) 5 MG tablet   losartan-hydrochlorothiazide (HYZAAR) 100-12.5 MG tablet   famotidine (PEPCID) 20 MG tablet   fexofenadine (ALLERGY RELIEF) 180 MG tablet   Other Relevant Orders   B12 and Folate Panel   CBC With Diff/Platelet   Comprehensive metabolic panel   Hemoglobin A1c   VITAMIN D 25 Hydroxy (Vit-D Deficiency, Fractures)   Lipid panel   TSH   T4, free   Encounter for annual physical exam - Primary    CBE today.  Labs pending.  Refills provided.  Health maintenance reviewed. Anticipatory guidance provided.      Relevant Medications   amLODipine (NORVASC) 5 MG tablet   losartan-hydrochlorothiazide (HYZAAR) 100-12.5 MG tablet   famotidine (PEPCID) 20 MG tablet   fexofenadine (ALLERGY RELIEF) 180 MG tablet   Other Relevant Orders   B12 and Folate Panel   CBC With Diff/Platelet   Comprehensive metabolic panel   Hemoglobin A1c   VITAMIN D 25 Hydroxy (Vit-D Deficiency, Fractures)   Lipid panel   TSH   T4, free   Gastroesophageal reflux disease    Chronic.  Refill provided today of famotidine.  No alarm symptoms present at this time.      Relevant Medications   amLODipine (NORVASC) 5 MG tablet   losartan-hydrochlorothiazide (HYZAAR) 100-12.5 MG tablet   famotidine (PEPCID) 20 MG tablet   fexofenadine (ALLERGY RELIEF) 180 MG tablet   Other Relevant Orders   B12 and Folate Panel   CBC With Diff/Platelet   Comprehensive metabolic panel   Hemoglobin A1c   VITAMIN D 25 Hydroxy (Vit-D Deficiency, Fractures)   Lipid panel   TSH   T4, free   Seasonal allergies    Chronic.  Refill provided on  [place].  No alarm symptoms at this time.      Relevant Medications    fexofenadine (ALLERGY RELIEF) 180 MG tablet   Irregular heartbeat    Detected on examination.  EKG completed which does show atrial fibrillation.  Please see atrial fibrillation plan for further assessment.      Relevant Orders   EKG 12-Lead   Atrial fibrillation ([place])    On examination heart rate rapid and irregular.  Patient asymptomatic.  EKG does show the presence of atrial fibrillation.  Rate of 111.  No prior history of atrial fibrillation personally, however, she does have history of this in her mother.  She endorses only intermittent sensations of palpitations otherwise no symptoms at baseline. ChadsVasc Score: 2 Will plan to start metoprolol '25mg'$  BID and daily ASA for management with referral to cardiology. At this time, given her risks and history a joint decision was made to not start anticoagulant therapy and defer this decision to cardiology.  Recommend close monitoring. If unable to get in with cardiology will plan to follow-up in the next 2 weeks.  HR initially 111, but this did come back down to 90's on re-evaluation.          Relevant Medications   amLODipine (NORVASC) 5 MG tablet   losartan-hydrochlorothiazide (HYZAAR) 100-12.5 MG tablet   metoprolol tartrate (LOPRESSOR) 25 MG tablet   Other Relevant Orders   Ambulatory referral to Cardiology   Other Visit Diagnoses     Health care maintenance              Follow up plan: No follow-ups on file.  NEXT PREVENTATIVE PHYSICAL DUE IN 1 YEAR.  PATIENT COUNSELING PROVIDED FOR ALL ADULT PATIENTS:  Consume a well balanced diet low in saturated fats, cholesterol, and moderation in carbohydrates.   This can be as simple as monitoring portion sizes and cutting back on sugary beverages such as soda and juice to start with.    Daily water consumption of at least 64 ounces.  Physical activity at least 180 minutes per week, if just starting out.   This can be as simple as taking the stairs instead of the elevator and  walking 2-3 laps around the office  purposefully every day.   STD protection, partner selection, and regular testing if high risk.  Limited consumption of alcoholic beverages if alcohol is consumed.  For women, I recommend no more than 7 alcoholic beverages per week, spread out throughout the week.  Avoid "binge" drinking or consuming large quantities of alcohol in one setting.   Please let me know if you feel you may need help with reduction or quitting alcohol consumption.   Avoidance of nicotine, if used.  Please let me know if you feel you may need help with reduction or quitting nicotine use.   Daily mental health attention.  This can be in the form of 5 minute daily meditation, prayer, journaling, yoga, reflection, etc.   Purposeful attention to your emotions and mental state can significantly improve your overall wellbeing and Health.  Please know that I am here to help you with all of your health care goals and am happy to work with you to find a solution that works best for you.  The greatest advice I have received with any changes in life are to take it one step at a time, that even means if all you can focus on is the next 60 seconds, then do that and celebrate your victories.  With any  changes in life, you will have set backs, and that is OK. The important thing to remember is, if you have a set back, it is not a failure, it is an opportunity to try again!  Health Maintenance Recommendations Screening Testing Mammogram Every 1 -2 years based on history and risk factors Starting at age 6 Pap Smear Ages 21-39 every 3 years Ages 46-65 every 5 years with HPV testing More frequent testing may be required based on results and history Colon Cancer Screening Every 1-10 years based on test performed, risk factors, and history Starting at age 55 Bone Density Screening Every 2-10 years based on history Starting at age 29 for women Recommendations for men differ based on  medication usage, history, and risk factors AAA Screening One time ultrasound Men 68-51 years old who have every smoked Lung Cancer Screening Low Dose Lung CT every 12 months Age 58-80 years with a 30 pack-year smoking history who still smoke or who have quit within the last 15 years  Screening Labs Routine  Labs: Complete Blood Count (CBC), Complete Metabolic Panel (CMP), Cholesterol (Lipid Panel) Every 6-12 months based on history and medications May be recommended more frequently based on current conditions or previous results Hemoglobin A1c Lab Every 3-12 months based on history and previous results Starting at age 14 or earlier with diagnosis of diabetes, high cholesterol, BMI >26, and/or risk factors Frequent monitoring for patients with diabetes to ensure blood sugar control Thyroid Panel (TSH w/ T3 & T4) Every 6 months based on history, symptoms, and risk factors May be repeated more often if on medication HIV One time testing for all patients 16 and older May be repeated more frequently for patients with increased risk factors or exposure Hepatitis C One time testing for all patients 45 and older May be repeated more frequently for patients with increased risk factors or exposure Gonorrhea, Chlamydia Every 12 months for all sexually active persons 13-24 years Additional monitoring may be recommended for those who are considered high risk or who have symptoms PSA Men 64-29 years old with risk factors Additional screening may be recommended from age 61-69 based on risk factors, symptoms, and history  Vaccine Recommendations Tetanus Booster All adults every 10 years Flu Vaccine All patients 6 months and older every year COVID Vaccine All patients 12 years and older Initial dosing with booster May recommend additional booster based on age and health history HPV Vaccine 2 doses all patients age 27-26 Dosing may be considered for patients over 26 Shingles Vaccine  (Shingrix) 2 doses all adults 52 years and older Pneumonia (Pneumovax 23) All adults 53 years and older May recommend earlier dosing based on health history Pneumonia (Prevnar 3) All adults 32 years and older Dosed 1 year after Pneumovax 23  Additional Screening, Testing, and Vaccinations may be recommended on an individualized basis based on family history, health history, risk factors, and/or exposure.

## 2022-09-14 NOTE — Assessment & Plan Note (Signed)
CBE today.  Labs pending.  Refills provided.  Health maintenance reviewed. Anticipatory guidance provided.

## 2022-09-14 NOTE — Addendum Note (Signed)
Addended by: Pennelope Bracken on: 09/14/2022 04:15 PM   Modules accepted: Orders

## 2022-09-15 LAB — COMPREHENSIVE METABOLIC PANEL
ALT: 20 IU/L (ref 0–32)
AST: 18 IU/L (ref 0–40)
Albumin/Globulin Ratio: 2 (ref 1.2–2.2)
Albumin: 4.8 g/dL (ref 3.9–4.9)
Alkaline Phosphatase: 74 IU/L (ref 44–121)
BUN/Creatinine Ratio: 17 (ref 12–28)
BUN: 12 mg/dL (ref 8–27)
Bilirubin Total: 0.9 mg/dL (ref 0.0–1.2)
CO2: 22 mmol/L (ref 20–29)
Calcium: 9.6 mg/dL (ref 8.7–10.3)
Chloride: 96 mmol/L (ref 96–106)
Creatinine, Ser: 0.69 mg/dL (ref 0.57–1.00)
Globulin, Total: 2.4 g/dL (ref 1.5–4.5)
Glucose: 108 mg/dL — ABNORMAL HIGH (ref 70–99)
Potassium: 3.9 mmol/L (ref 3.5–5.2)
Sodium: 135 mmol/L (ref 134–144)
Total Protein: 7.2 g/dL (ref 6.0–8.5)
eGFR: 99 mL/min/{1.73_m2} (ref >59)

## 2022-09-15 LAB — B12 AND FOLATE PANEL
Folate: >20 ng/mL (ref >3.0)
Vitamin B-12: 1155 pg/mL (ref 232–1245)

## 2022-09-15 LAB — CBC WITH DIFF/PLATELET
Basophils Absolute: 0.1 10*3/uL (ref 0.0–0.2)
Basos: 1 %
EOS (ABSOLUTE): 0.2 10*3/uL (ref 0.0–0.4)
Eos: 2 %
Hematocrit: 41.7 % (ref 34.0–46.6)
Hemoglobin: 14.2 g/dL (ref 11.1–15.9)
Immature Grans (Abs): 0 10*3/uL (ref 0.0–0.1)
Immature Granulocytes: 0 %
Lymphocytes Absolute: 1.9 10*3/uL (ref 0.7–3.1)
Lymphs: 21 %
MCH: 33.3 pg — ABNORMAL HIGH (ref 26.6–33.0)
MCHC: 34.1 g/dL (ref 31.5–35.7)
MCV: 98 fL — ABNORMAL HIGH (ref 79–97)
Monocytes Absolute: 0.9 10*3/uL (ref 0.1–0.9)
Monocytes: 10 %
Neutrophils Absolute: 5.9 10*3/uL (ref 1.4–7.0)
Neutrophils: 66 %
Platelets: 291 10*3/uL (ref 150–450)
RBC: 4.26 x10E6/uL (ref 3.77–5.28)
RDW: 11.9 % (ref 11.7–15.4)
WBC: 9 10*3/uL (ref 3.4–10.8)

## 2022-09-15 LAB — LIPID PANEL
Chol/HDL Ratio: 4 ratio (ref 0.0–4.4)
Cholesterol, Total: 266 mg/dL — ABNORMAL HIGH (ref 100–199)
HDL: 66 mg/dL (ref >39)
LDL Chol Calc (NIH): 179 mg/dL — ABNORMAL HIGH (ref 0–99)
Triglycerides: 117 mg/dL (ref 0–149)
VLDL Cholesterol Cal: 21 mg/dL (ref 5–40)

## 2022-09-15 LAB — T4, FREE: Free T4: 1.38 ng/dL (ref 0.82–1.77)

## 2022-09-15 LAB — HEMOGLOBIN A1C
Est. average glucose Bld gHb Est-mCnc: 111 mg/dL
Hgb A1c MFr Bld: 5.5 % (ref 4.8–5.6)

## 2022-09-15 LAB — TSH: TSH: 3.26 u[IU]/mL (ref 0.450–4.500)

## 2022-09-15 LAB — VITAMIN D 25 HYDROXY (VIT D DEFICIENCY, FRACTURES): Vit D, 25-Hydroxy: 27.7 ng/mL — ABNORMAL LOW (ref 30.0–100.0)

## 2022-09-16 ENCOUNTER — Ambulatory Visit (HOSPITAL_BASED_OUTPATIENT_CLINIC_OR_DEPARTMENT_OTHER): Payer: BC Managed Care – PPO | Admitting: Obstetrics & Gynecology

## 2022-09-17 ENCOUNTER — Encounter (HOSPITAL_BASED_OUTPATIENT_CLINIC_OR_DEPARTMENT_OTHER): Payer: Self-pay | Admitting: Obstetrics & Gynecology

## 2022-09-17 ENCOUNTER — Ambulatory Visit (INDEPENDENT_AMBULATORY_CARE_PROVIDER_SITE_OTHER): Payer: BC Managed Care – PPO | Admitting: Obstetrics & Gynecology

## 2022-09-17 VITALS — BP 162/72 | HR 64 | Ht 65.5 in | Wt 183.8 lb

## 2022-09-17 DIAGNOSIS — Z87440 Personal history of urinary (tract) infections: Secondary | ICD-10-CM | POA: Diagnosis not present

## 2022-09-17 DIAGNOSIS — Z170 Estrogen receptor positive status [ER+]: Secondary | ICD-10-CM

## 2022-09-17 DIAGNOSIS — Z01419 Encounter for gynecological examination (general) (routine) without abnormal findings: Secondary | ICD-10-CM | POA: Diagnosis not present

## 2022-09-17 DIAGNOSIS — B009 Herpesviral infection, unspecified: Secondary | ICD-10-CM

## 2022-09-17 DIAGNOSIS — Z780 Asymptomatic menopausal state: Secondary | ICD-10-CM | POA: Diagnosis not present

## 2022-09-17 DIAGNOSIS — I10 Essential (primary) hypertension: Secondary | ICD-10-CM

## 2022-09-17 DIAGNOSIS — C50212 Malignant neoplasm of upper-inner quadrant of left female breast: Secondary | ICD-10-CM

## 2022-09-17 MED ORDER — ASPIRIN 325 MG PO TBEC: 325.0000 mg | DELAYED_RELEASE_TABLET | Freq: Every day | ORAL | Status: DC

## 2022-09-17 MED ORDER — NITROFURANTOIN MONOHYD MACRO 100 MG PO CAPS: ORAL_CAPSULE | ORAL | 1 refills | Status: DC

## 2022-09-17 MED ORDER — VALACYCLOVIR HCL 1 G PO TABS: ORAL_TABLET | ORAL | 1 refills | Status: DC

## 2022-09-17 NOTE — Progress Notes (Signed)
61 y.o. [id number] Married White or Caucasian female here for annual exam.  Mother is ad the end of her life.  She is at [facility] and this has been stressful.  She is having some sleep issues.  She is taking some melatonin.  Right now, sleep has been harder.   She has retired from [employer].  This has been a good thing.    Diagnosed with afib last week.  Had noticed on her watch that pulse was in the 100 - 110 range.  On metorolol and ASA.  Will see cardiology next week.     Denies vaginal bleeding.    Patient's last menstrual period was 07/17/2013.          Sexually active: Yes.    The current method of family planning is post menopausal status.    Smoker:  no  Health Maintenance: Pap:  09/11/2021 Negative History of abnormal Pap:  yes MMG:  05/21/2022 Negative Colonoscopy:  02/22/2015, follow up years BMD:   06/01/2022 Screening Labs: just done   reports that she has never smoked. She has never used smokeless tobacco. She reports current alcohol use of about 2.0 standard drinks of alcohol per week. She reports that she does not use drugs.  Past Medical History:  Diagnosis Date   Allergy    Anemia    hx   GERD (gastroesophageal reflux disease)    History of abnormal cervical Pap smear    HSV infection    h/o   HTN (hypertension)    Malignant neoplasm of upper-inner quadrant of left breast in female, estrogen receptor positive ([place]) 04/22/2017   Ovarian cyst    h/o   Perennial allergic rhinitis    [place]    Seasonal allergic rhinitis    [place]    Past Surgical History:  Procedure Laterality Date   ADENOIDECTOMY     only adenoids still have tonsils   BREAST LUMPECTOMY WITH RADIOACTIVE SEED AND SENTINEL LYMPH NODE BIOPSY Left 05/17/2017   Procedure: LEFT BREAST LUMPECTOMY WITH RADIOACTIVE SEED AND SENTINEL LYMPH NODE BIOPSY;  Surgeon: Autumn Messing III, MD;  Location: [facility];  Service: General;  Laterality: Left;   CESAREAN SECTION     x3   excision of precancerous mole      lft arm, chest    Current Outpatient Medications  Medication Sig Dispense Refill   amLODipine (NORVASC) 5 MG tablet Take 1 tablet (5 mg total) by mouth daily. 90 tablet 3   anastrozole (ARIMIDEX) 1 MG tablet TAKE 1 TABLET(1 MG) BY MOUTH DAILY 90 tablet 3   aspirin EC 325 MG tablet Take 1 tablet (325 mg total) by mouth daily.     famotidine (PEPCID) 20 MG tablet Take 1 tablet (20 mg total) by mouth daily as needed for heartburn or indigestion. 90 tablet 3   fexofenadine (ALLERGY RELIEF) 180 MG tablet Take 1 tablet (180 mg total) by mouth as needed. 90 tablet 3   IBUPROFEN PO Take by mouth as needed.     losartan-hydrochlorothiazide (HYZAAR) 100-12.5 MG tablet Take 1 tablet by mouth daily. 90 tablet 3   metoprolol tartrate (LOPRESSOR) 25 MG tablet Take 1 tablet (25 mg total) by mouth 2 (two) times daily. 60 tablet 3   nitrofurantoin, macrocrystal-monohydrate, (MACROBID) 100 MG capsule Take 1 tab daily prn UTI prophylaxis 30 capsule 1   valACYclovir (VALTREX) 1000 MG tablet 2 grams x 1, repeat in 12 hours.  Use with symptom onset. 30 tablet 1   No current  facility-administered medications for this visit.    Family History  Problem Relation Age of Onset   Mitral valve prolapse Mother    Alzheimer's disease Mother    Prostate cancer Father    Transient ischemic attack Father    Breast cancer Paternal Aunt 61       bile duct cancer   Colon cancer Neg Hx    Rectal cancer Neg Hx    Stomach cancer Neg Hx    Esophageal cancer Neg Hx     ROS: Constitutional: negative Genitourinary:negative  Exam:   BP (!) 162/72 (BP Location: Right Arm, Patient Position: Sitting, Cuff Size: Large)   Pulse 64   Ht 5' 5.5" (1.664 m) Comment: Reported  Wt 183 lb 12.8 oz (83.4 kg)   LMP 07/17/2013   BMI 30.12 kg/m   Height: 5' 5.5" (166.4 cm) (Reported)  General appearance: alert, cooperative and appears stated age Head: Normocephalic, without obvious abnormality, atraumatic Neck: no adenopathy,  supple, symmetrical, trachea midline and thyroid normal to inspection and palpation Lungs: clear to auscultation bilaterally Breasts: normal appearance, no masses or tenderness Heart: regular rate and rhythm Abdomen: soft, non-tender; bowel sounds normal; no masses,  no organomegaly Extremities: extremities normal, atraumatic, no cyanosis or edema Skin: Skin color, texture, turgor normal. No rashes or lesions Lymph nodes: Cervical, supraclavicular, and axillary nodes normal. No abnormal inguinal nodes palpated Neurologic: Grossly normal   Pelvic: External genitalia:  no lesions              Urethra:  normal appearing urethra with no masses, tenderness or lesions              Bartholins and Skenes: normal                 Vagina: normal appearing vagina with normal color and no discharge, no lesions              Cervix: no lesions              Pap taken: No. Bimanual Exam:  Uterus:  normal size, contour, position, consistency, mobility, non-tender              Adnexa: normal adnexa and no mass, fullness, tenderness               Rectovaginal: Confirms               Anus:  normal sphincter tone, no lesions  Chaperone, Octaviano Batty, CMA, was present for exam.  Assessment/Plan: 1. Well woman exam with routine gynecological exam - Pap smear 2022 - Mammogram 05/2022 - Colonoscopy 02/2015 - Bone mineral density 05/2022 - lab work done with PCP - vaccines reviewed/updated  2. Postmenopausal  3. History of recurrent UTI (urinary tract infection) - nitrofurantoin, macrocrystal-monohydrate, (MACROBID) 100 MG capsule; Take 1 tab daily prn UTI prophylaxis  Dispense: 30 capsule; Refill: 1  4. HSV (herpes simplex virus) infection - valACYclovir (VALTREX) 1000 MG tablet; 2 grams x 1, repeat in 12 hours.  Use with symptom onset.  Dispense: 30 tablet; Refill: 1  5. Essential hypertension  6. Malignant neoplasm of upper-inner quadrant of left breast in female, estrogen receptor positive  ([place])  6. Afib - has appt with cardiology next week

## 2022-09-21 NOTE — Progress Notes (Unsigned)
Cardiology Office Note:    Date:  09/22/2022   ID:  CASI WESTERFELD, DOB 1961/06/15, MRN [id number]  PCP:  Orma Render, NP   [facility] Cardiologist:  Janina Mayo, MD     Referring MD: Orma Render, NP   Chief Complaint  Patient presents with   New Patient (Initial Visit)  Paroxysmal Atrial Fibrillation  History of Present Illness:    Kristina Sanders is a 61 y.o. 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 Seen by her PCP on 10/30 and found to be in afib. She was started on metop 25 mg BID tartrate and 325 mg asa. Every 2 months she's notes a racing heart beat. Her mother had atrial fibrillation. She recently passed away and she is under a great deal of stress and grieving.  No prior cardiac dx. No thyroid dx. 2 POINTS STOP BANG- low risk for OSA. TSH is normal.   Past Medical History:  Diagnosis Date   Allergy    Anemia    hx   GERD (gastroesophageal reflux disease)    History of abnormal cervical Pap smear    HSV infection    h/o   HTN (hypertension)    Malignant neoplasm of upper-inner quadrant of left breast in female, estrogen receptor positive ([place]) 04/22/2017   Ovarian cyst    h/o   Perennial allergic rhinitis    [place]    Seasonal allergic rhinitis    [place]    Past Surgical History:  Procedure Laterality Date   ADENOIDECTOMY     only adenoids still have tonsils   BREAST LUMPECTOMY WITH RADIOACTIVE SEED AND SENTINEL LYMPH NODE BIOPSY Left 05/17/2017   Procedure: LEFT BREAST LUMPECTOMY WITH RADIOACTIVE SEED AND SENTINEL LYMPH NODE BIOPSY;  Surgeon: Autumn Messing III, MD;  Location: [facility];  Service: General;  Laterality: Left;   CESAREAN SECTION     x3   excision of precancerous mole     lft arm, chest    Current Medications: Current Meds  Medication Sig    amLODipine (NORVASC) 5 MG tablet Take 1 tablet (5 mg total) by mouth daily.   anastrozole (ARIMIDEX) 1 MG tablet TAKE 1 TABLET(1 MG) BY MOUTH DAILY   aspirin EC 81 MG tablet Take 1 tablet (81 mg total) by mouth daily. Swallow whole.   famotidine (PEPCID) 20 MG tablet Take 1 tablet (20 mg total) by mouth daily as needed for heartburn or indigestion.   fexofenadine (ALLERGY RELIEF) 180 MG tablet Take 1 tablet (180 mg total) by mouth as needed.   IBUPROFEN PO Take by mouth as needed.   losartan-hydrochlorothiazide (HYZAAR) 100-12.5 MG tablet Take 1 tablet by mouth daily.   metoprolol tartrate (LOPRESSOR) 25 MG tablet Take 1 tablet (25 mg total) by mouth 2 (two) times daily.   nitrofurantoin, macrocrystal-monohydrate, (MACROBID) 100 MG capsule Take 1 tab daily prn UTI prophylaxis   valACYclovir (VALTREX) 1000 MG tablet 2 grams x 1, repeat in 12 hours.  Use with symptom onset.   [DISCONTINUED] aspirin EC 325 MG tablet Take 1 tablet (325 mg total) by mouth daily.     Allergies:   Patient has no known allergies.   Social History   Socioeconomic History   Marital status: Married    Spouse name: Not on file   Number of children:  3   Years of education: Not on file   Highest education level: Not on file  Occupational History   Not on file  Tobacco Use   Smoking status: Never   Smokeless tobacco: Never  Vaping Use   Vaping Use: Never used  Substance and Sexual Activity   Alcohol use: Yes    Alcohol/week: 2.0 standard drinks of alcohol    Types: 2 Standard drinks or equivalent per week   Drug use: No   Sexual activity: Yes    Partners: Male    Birth control/protection: Other-see comments    Comment: vasectomy  Other Topics Concern   Not on file  Social History Narrative   Not on file   Social Determinants of Health   Financial Resource Strain: Not on file  Food Insecurity: Not on file  Transportation Needs: Not on file  Physical Activity: Not on file  Stress: Not on file   Social Connections: Not on file     Family History: The patient's family history includes Alzheimer's disease in her mother; Breast cancer (age of onset: 44) in her paternal aunt; Mitral valve prolapse in her mother; Prostate cancer in her father; Transient ischemic attack in her father. There is no history of Colon cancer, Rectal cancer, Stomach cancer, or Esophageal cancer.  ROS:   Please see the history of present illness.     All other systems reviewed and are negative.  EKGs/Labs/Other Studies Reviewed:    The following studies were reviewed today:   EKG:  EKG is  ordered today.  The ekg ordered today demonstrates   11/7- NSR  Recent Labs: 09/14/2022: ALT 20; BUN 12; Creatinine, Ser 0.69; Hemoglobin 14.2; Platelets 291; Potassium 3.9; Sodium 135; TSH 3.260  Recent Lipid Panel    Component Value Date/Time   CHOL 266 (H) 09/14/2022 1003   TRIG 117 09/14/2022 1003   HDL 66 09/14/2022 1003   CHOLHDL 4.0 09/14/2022 1003   CHOLHDL 3.3 04/03/2014 0925   VLDL 23 04/03/2014 0925   LDLCALC 179 (H) 09/14/2022 1003     Risk Assessment/Calculations:    CHA2DS2-VASc Score = 2   This indicates a 2.2% annual risk of stroke. The patient's score is based upon: CHF History: 0 HTN History: 1 Diabetes History: 0 Stroke History: 0 Vascular Disease History: 0 Age Score: 0 Gender Score: 1        Physical Exam:    VS:  BP 128/62 (BP Location: Right Arm, Patient Position: Sitting, Cuff Size: Large)   Pulse 66   Ht _0  (1.651 m)   Wt 183 lb 12.8 oz (83.4 kg)   LMP 07/17/2013   SpO2 99%   BMI 30.59 kg/m     Wt Readings from Last 3 Encounters:  09/22/22 183 lb 12.8 oz (83.4 kg)  09/17/22 183 lb 12.8 oz (83.4 kg)  09/14/22 182 lb (82.6 kg)     GEN:  Well nourished, well developed in no acute distress HEENT: Normal NECK: No JVD; No carotid bruits LYMPHATICS: No lymphadenopathy CARDIAC: RRR, no murmurs, rubs, gallops RESPIRATORY:  Clear to auscultation without rales,  wheezing or rhonchi  ABDOMEN: Soft, non-tender, non-distended MUSCULOSKELETAL:  No edema; No deformity  SKIN: Warm and dry NEUROLOGIC:  Alert and oriented x 3 PSYCHIATRIC:  Normal affect   ASSESSMENT:   Paroxysmal Atrial Fibrillation: CHADS2VASC=2. low risk of stroke. No plans for [facility] at this time. Can take ASA 81 mg daily instead of 325 mg daily.  Will plan for rate control  initially, continue metop tartrate 25 mg BID. Will obtain a TTE. Candidate for many AAD/PVI.   HTN: continue losartan 100-Hctz 12.5 mg  Cardio-Onc: no significant cardiotoxic cancer therapy. RT to the L breast recent. CVD risk is low. here's a higher incidence of Afib in cancer patients. Not on chemotherapy, low risk of DDI with AAD with current medications.   PLAN:    In order of problems listed above:  TTE ASA 325-> 81 mg daily Follow up 6 months           Medication Adjustments/Labs and Tests Ordered: Current medicines are reviewed at length with the patient today.  Concerns regarding medicines are outlined above.  Orders Placed This Encounter  Procedures   EKG 12-Lead   ECHOCARDIOGRAM COMPLETE   Meds ordered this encounter  Medications   aspirin EC 81 MG tablet    Sig: Take 1 tablet (81 mg total) by mouth daily. Swallow whole.    Dispense:  90 tablet    Refill:  3    Patient Instructions  Medication Instructions:   STOP ASPIRIN 363m  START: ASPIRIN 888mONCE DAILY   *If you need a refill on your cardiac medications before your next appointment, please call your pharmacy*  Lab Work: None Ordered At This Time.  If you have labs (blood work) drawn today and your tests are completely normal, you will receive your results only by: MyRossif you have MyChart) OR A paper copy in the mail If you have any lab test that is abnormal or we need to change your treatment, we will call you to review the results.  Testing/Procedures: Your physician has requested that you have an  echocardiogram. Echocardiography is a painless test that uses sound waves to create images of your heart. It provides your doctor with information about the size and shape of your heart and how well your heart's chambers and valves are working. You may receive an ultrasound enhancing agent through an IV if needed to better visualize your heart during the echo.This procedure takes approximately one hour. There are no restrictions for this procedure. [address]   Follow-Up: [facility].  As part of our continuing mission to provide you with exceptional heart care, we have created designated Provider Care Teams.  These Care Teams include your primary Cardiologist (physician) and Advanced Practice Providers (APPs -  Physician Assistants and Nurse Practitioners) who all work together to provide you with the care you need, when you need it.  Your next appointment:   6 month(s)  The format for your next appointment:   In Person  Provider:   BrJanina MayoMD            Signed, BrJanina MayoMD  09/22/2022 11:44 AM    [place]

## 2022-09-22 ENCOUNTER — Ambulatory Visit: Payer: BC Managed Care – PPO | Attending: Internal Medicine | Admitting: Internal Medicine

## 2022-09-22 ENCOUNTER — Encounter: Payer: Self-pay | Admitting: Internal Medicine

## 2022-09-22 VITALS — BP 128/62 | HR 66 | Ht 65.0 in | Wt 183.8 lb

## 2022-09-22 DIAGNOSIS — I480 Paroxysmal atrial fibrillation: Secondary | ICD-10-CM

## 2022-09-22 MED ORDER — ASPIRIN 81 MG PO TBEC: 81.0000 mg | DELAYED_RELEASE_TABLET | Freq: Every day | ORAL | 3 refills | Status: DC

## 2022-09-22 NOTE — Patient Instructions (Addendum)
Medication Instructions:   STOP ASPIRIN '325mg'$   START: ASPIRIN '81mg'$  ONCE DAILY   *If you need a refill on your cardiac medications before your next appointment, please call your pharmacy*  Lab Work: None Ordered At This Time.  If you have labs (blood work) drawn today and your tests are completely normal, you will receive your results only by: [place] (if you have MyChart) OR A paper copy in the mail If you have any lab test that is abnormal or we need to change your treatment, we will call you to review the results.  Testing/Procedures: Your physician has requested that you have an echocardiogram. Echocardiography is a painless test that uses sound waves to create images of your heart. It provides your doctor with information about the size and shape of your heart and how well your heart's chambers and valves are working. You may receive an ultrasound enhancing agent through an IV if needed to better visualize your heart during the echo.This procedure takes approximately one hour. There are no restrictions for this procedure. This will take place at the [address].   Follow-Up: At [facility], you and your health needs are our priority.  As part of our continuing mission to provide you with exceptional heart care, we have created designated Provider Care Teams.  These Care Teams include your primary Cardiologist (physician) and Advanced Practice Providers (APPs -  Physician Assistants and Nurse Practitioners) who all work together to provide you with the care you need, when you need it.  Your next appointment:   6 month(s)  The format for your next appointment:   In Person  Provider:   Janina Mayo, MD

## 2022-10-14 ENCOUNTER — Ambulatory Visit (HOSPITAL_COMMUNITY): Payer: BC Managed Care – PPO | Attending: Cardiovascular Disease

## 2022-10-14 DIAGNOSIS — I480 Paroxysmal atrial fibrillation: Secondary | ICD-10-CM | POA: Diagnosis present

## 2022-10-14 LAB — ECHOCARDIOGRAM COMPLETE
Area-P 1/2: 4.7 cm2
S' Lateral: 2.6 cm

## 2022-11-10 ENCOUNTER — Encounter (HOSPITAL_BASED_OUTPATIENT_CLINIC_OR_DEPARTMENT_OTHER): Payer: Self-pay | Admitting: Obstetrics & Gynecology

## 2022-11-10 ENCOUNTER — Encounter: Payer: Self-pay | Admitting: Medical

## 2022-11-10 ENCOUNTER — Telehealth: Payer: BC Managed Care – PPO | Admitting: Medical

## 2022-11-10 ENCOUNTER — Encounter: Payer: Self-pay | Admitting: Nurse Practitioner

## 2022-11-10 VITALS — Ht 65.5 in | Wt 178.0 lb

## 2022-11-10 DIAGNOSIS — U071 COVID-19: Secondary | ICD-10-CM | POA: Diagnosis not present

## 2022-11-10 DIAGNOSIS — I480 Paroxysmal atrial fibrillation: Secondary | ICD-10-CM | POA: Diagnosis not present

## 2022-11-10 MED ORDER — MOLNUPIRAVIR EUA 200MG CAPSULE: 4.0000 | ORAL_CAPSULE | Freq: Two times a day (BID) | ORAL | 0 refills | Status: AC

## 2022-11-10 NOTE — Progress Notes (Signed)
Subjective:     Patient ID: Kristina Sanders, female   DOB: [birth year], 61 y.o.   MRN: [id number]  This visit type was conducted due to national recommendations for restrictions regarding the COVID-19 Pandemic (e.g. social distancing) in an effort to limit this patient's exposure and mitigate transmission in our community.  Due to their co-morbid illnesses, this patient is at least at moderate risk for complications without adequate follow up.  This format is felt to be most appropriate for this patient at this time.    Documentation for virtual audio and video telecommunications through [place] encounter:  The patient was located at home. The provider was located in the office. The patient did consent to this visit and is aware of possible charges through their insurance for this visit.  The other persons participating in this telemedicine service were none. Time spent on call was 20 minutes and in review of previous records 20 minutes total.  This virtual service is not related to other E/M service within previous 7 days.   HPI Chief Complaint  Patient presents with   other    Tested positive yesterday, coughing, ST, lots of congestion, body aches, fever, fatigue, 102.5 last night, took advil, and tylenol,    Virtual consult for illnes.   Awoke yesterday early morning feeling bad.  Awoke with nasal congestion.   Tested positive for covid that morning.   Felt like regular cold symptoms in initially but by afternoon had aches, fever, cough, sore throat, fatigue, lots of congestion, lack of appetite, feels like full blown full.   No prior covid.  Fever up to 102.5.   is using Advil and that seems to be helping the fever.  Using some nasal saline spray, used some afrin last night.    Is a breast cancer survivor on arimidex reglalry  Has new hx/o afrib a few months ago, has paroxsymal afib, can usually tell when in afib.   Past Medical History:  Diagnosis Date   Allergy    Anemia     hx   GERD (gastroesophageal reflux disease)    History of abnormal cervical Pap smear    HSV infection    h/o   HTN (hypertension)    Malignant neoplasm of upper-inner quadrant of left breast in female, estrogen receptor positive ([place]) 04/22/2017   Ovarian cyst    h/o   Perennial allergic rhinitis    [place]    Seasonal allergic rhinitis    [place]   Current Outpatient Medications on File Prior to Visit  Medication Sig Dispense Refill   amLODipine (NORVASC) 5 MG tablet Take 1 tablet (5 mg total) by mouth daily. 90 tablet 3   anastrozole (ARIMIDEX) 1 MG tablet TAKE 1 TABLET(1 MG) BY MOUTH DAILY 90 tablet 3   aspirin EC 81 MG tablet Take 1 tablet (81 mg total) by mouth daily. Swallow whole. 90 tablet 3   famotidine (PEPCID) 20 MG tablet Take 1 tablet (20 mg total) by mouth daily as needed for heartburn or indigestion. 90 tablet 3   fexofenadine (ALLERGY RELIEF) 180 MG tablet Take 1 tablet (180 mg total) by mouth as needed. 90 tablet 3   IBUPROFEN PO Take by mouth as needed.     losartan-hydrochlorothiazide (HYZAAR) 100-12.5 MG tablet Take 1 tablet by mouth daily. 90 tablet 3   metoprolol tartrate (LOPRESSOR) 25 MG tablet Take 1 tablet (25 mg total) by mouth 2 (two) times daily. 60 tablet 3   nitrofurantoin, macrocrystal-monohydrate, (MACROBID) 100 MG  capsule Take 1 tab daily prn UTI prophylaxis (Patient not taking: Reported on 11/10/2022) 30 capsule 1   valACYclovir (VALTREX) 1000 MG tablet 2 grams x 1, repeat in 12 hours.  Use with symptom onset. (Patient not taking: Reported on 11/10/2022) 30 tablet 1   No current facility-administered medications on file prior to visit.   Review of Systems As in subjective    Objective:   Physical Exam Due to coronavirus pandemic stay at home measures, patient visit was virtual and they were not examined in person.   Ht 5' 5.5" (1.664 m)   Wt 178 lb (80.7 kg)   LMP 07/17/2013   BMI 29.17 kg/m   General: Well developed, well-nourished no  acute distress, mildly ill-appearing No obvious wheezing or labored breathing     Assessment:     Encounter Diagnoses  Name Primary?   COVID-19 virus infection Yes   Paroxysmal atrial fibrillation ([place])        Plan:      General recommendations: I recommend you rest, hydrate well with water and clear fluids throughout the day.   You can use Tylenol for pain or fever You can use over the counter Robitussin DM or Delsym for cough. You can use over the counter Emetrol for nausea.    Begin Molnupiravir to help reduce severity or risk of hospitalization  HOLD back on Amlodipine while on the Molnupiravir.  You can continue Afrin up to 3-4 days preferably just at bedtime.     If you are having trouble breathing, if you are very weak, have high fever 103 or higher consistently despite Tylenol, or uncontrollable nausea and vomiting, then call or go to the emergency department.    If you have other questions or have other symptoms or questions you are concerned about then please make a virtual visit  Covid symptoms such as fatigue and cough can linger over 2 weeks, even after the initial fever, aches, chills, and other initial symptoms.   Self Quarantine: The CDC, Centers for Disease Control has recommended a self quarantine of 5 days from the start of your illness until you are symptom-free including at least 24 hours of no symptoms including no fever, no shortness of breath, and no body aches and chills, by day 5 before returning to work or general contact with the public.  What does self quarantine mean: avoiding contact with people as much as possible.   Particularly in your house, isolate your self from others in a separate room, wear a mask when possible in the room, particularly if coughing a lot.   Have others bring food, water, medications, etc., to your door, but avoid direct contact with your household contacts during this time to avoid spreading the infection to them.   If you  have a separate bathroom and living quarters during the next 2 weeks away from others, that would be preferable.    If you can't completely isolate, then wear a mask, wash hands frequently with soap and water for at least 15 seconds, minimize close contact with others, and have a friend or family member check regularly from a distance to make sure you are not getting seriously worse.     You should not be going out in public, should not be going to stores, to work or other public places until all your symptoms have resolved and at least 5 days + 24 hours of no symptoms at all have transpired.   Ideally you should avoid contact with  others for a full 5 days if possible.  One of the goals is to limit spread to high risk people; people that are older and elderly, people with multiple health issues like diabetes, heart disease, lung disease, and anybody that has weakened immune systems such as people with cancer or on immunosuppressive therapy.   Tenya was seen today for other.  Diagnoses and all orders for this visit:  COVID-19 virus infection  Paroxysmal atrial fibrillation ([place])  Other orders -     molnupiravir EUA (LAGEVRIO) 200 mg CAPS capsule; Take 4 capsules (800 mg total) by mouth 2 (two) times daily for 5 days.  F/u prn

## 2022-11-11 ENCOUNTER — Other Ambulatory Visit: Payer: Self-pay | Admitting: Medical

## 2022-11-11 MED ORDER — HYDROCODONE BIT-HOMATROP MBR 5-1.5 MG/5ML PO SOLN: 5.0000 mL | Freq: Three times a day (TID) | ORAL | 0 refills | Status: AC | PRN

## 2022-11-20 ENCOUNTER — Other Ambulatory Visit (HOSPITAL_BASED_OUTPATIENT_CLINIC_OR_DEPARTMENT_OTHER): Payer: Self-pay | Admitting: Nurse Practitioner

## 2022-11-20 DIAGNOSIS — Z0000 Encounter for general adult medical examination without abnormal findings: Secondary | ICD-10-CM

## 2022-11-20 DIAGNOSIS — K219 Gastro-esophageal reflux disease without esophagitis: Secondary | ICD-10-CM

## 2022-11-20 DIAGNOSIS — R7303 Prediabetes: Secondary | ICD-10-CM

## 2022-11-20 DIAGNOSIS — I10 Essential (primary) hypertension: Secondary | ICD-10-CM

## 2022-11-26 ENCOUNTER — Other Ambulatory Visit (HOSPITAL_BASED_OUTPATIENT_CLINIC_OR_DEPARTMENT_OTHER): Payer: Self-pay | Admitting: Nurse Practitioner

## 2022-11-26 DIAGNOSIS — I10 Essential (primary) hypertension: Secondary | ICD-10-CM

## 2022-11-26 DIAGNOSIS — R7303 Prediabetes: Secondary | ICD-10-CM

## 2022-11-26 DIAGNOSIS — K219 Gastro-esophageal reflux disease without esophagitis: Secondary | ICD-10-CM

## 2022-11-26 DIAGNOSIS — Z0000 Encounter for general adult medical examination without abnormal findings: Secondary | ICD-10-CM

## 2022-12-30 ENCOUNTER — Encounter (HOSPITAL_BASED_OUTPATIENT_CLINIC_OR_DEPARTMENT_OTHER): Payer: Self-pay | Admitting: Obstetrics & Gynecology

## 2023-01-04 ENCOUNTER — Encounter: Payer: Self-pay | Admitting: *Deleted

## 2023-01-14 ENCOUNTER — Other Ambulatory Visit (HOSPITAL_BASED_OUTPATIENT_CLINIC_OR_DEPARTMENT_OTHER): Payer: Self-pay | Admitting: Nurse Practitioner

## 2023-01-14 ENCOUNTER — Encounter: Payer: Self-pay | Admitting: Internal Medicine

## 2023-01-14 ENCOUNTER — Other Ambulatory Visit: Payer: Self-pay | Admitting: Internal Medicine

## 2023-01-14 DIAGNOSIS — I4891 Unspecified atrial fibrillation: Secondary | ICD-10-CM

## 2023-02-03 ENCOUNTER — Other Ambulatory Visit: Payer: Self-pay | Admitting: Hematology

## 2023-02-03 DIAGNOSIS — E2839 Other primary ovarian failure: Secondary | ICD-10-CM

## 2023-02-05 ENCOUNTER — Other Ambulatory Visit: Payer: Self-pay

## 2023-02-05 DIAGNOSIS — C50212 Malignant neoplasm of upper-inner quadrant of left female breast: Secondary | ICD-10-CM

## 2023-02-06 ENCOUNTER — Other Ambulatory Visit: Payer: Self-pay | Admitting: Nurse Practitioner

## 2023-02-06 DIAGNOSIS — C50212 Malignant neoplasm of upper-inner quadrant of left female breast: Secondary | ICD-10-CM

## 2023-02-09 ENCOUNTER — Other Ambulatory Visit: Payer: Self-pay

## 2023-02-09 ENCOUNTER — Encounter: Payer: Self-pay | Admitting: Nurse Practitioner

## 2023-02-09 ENCOUNTER — Inpatient Hospital Stay: Payer: BC Managed Care – PPO | Admitting: Nurse Practitioner

## 2023-02-09 ENCOUNTER — Inpatient Hospital Stay: Payer: BC Managed Care – PPO | Attending: Nurse Practitioner

## 2023-02-09 VITALS — BP 115/61 | HR 70 | Temp 97.6°F | Resp 18 | Ht 65.5 in | Wt 189.5 lb

## 2023-02-09 DIAGNOSIS — I4891 Unspecified atrial fibrillation: Secondary | ICD-10-CM | POA: Insufficient documentation

## 2023-02-09 DIAGNOSIS — Z79811 Long term (current) use of aromatase inhibitors: Secondary | ICD-10-CM | POA: Insufficient documentation

## 2023-02-09 DIAGNOSIS — Z7982 Long term (current) use of aspirin: Secondary | ICD-10-CM | POA: Insufficient documentation

## 2023-02-09 DIAGNOSIS — Z923 Personal history of irradiation: Secondary | ICD-10-CM | POA: Insufficient documentation

## 2023-02-09 DIAGNOSIS — I10 Essential (primary) hypertension: Secondary | ICD-10-CM | POA: Diagnosis not present

## 2023-02-09 DIAGNOSIS — C50212 Malignant neoplasm of upper-inner quadrant of left female breast: Secondary | ICD-10-CM | POA: Diagnosis not present

## 2023-02-09 DIAGNOSIS — Z170 Estrogen receptor positive status [ER+]: Secondary | ICD-10-CM | POA: Insufficient documentation

## 2023-02-09 DIAGNOSIS — M8580 Other specified disorders of bone density and structure, unspecified site: Secondary | ICD-10-CM | POA: Diagnosis not present

## 2023-02-09 DIAGNOSIS — Z79899 Other long term (current) drug therapy: Secondary | ICD-10-CM | POA: Diagnosis not present

## 2023-02-09 LAB — CBC WITH DIFFERENTIAL (CANCER CENTER ONLY)
Abs Immature Granulocytes: 0.02 10*3/uL (ref 0.00–0.07)
Basophils Absolute: 0.1 10*3/uL (ref 0.0–0.1)
Basophils Relative: 1 %
Eosinophils Absolute: 0.3 10*3/uL (ref 0.0–0.5)
Eosinophils Relative: 4 %
HCT: 34.9 % — ABNORMAL LOW (ref 36.0–46.0)
Hemoglobin: 12.3 g/dL (ref 12.0–15.0)
Immature Granulocytes: 0 %
Lymphocytes Relative: 24 %
Lymphs Abs: 1.7 10*3/uL (ref 0.7–4.0)
MCH: 34.5 pg — ABNORMAL HIGH (ref 26.0–34.0)
MCHC: 35.2 g/dL (ref 30.0–36.0)
MCV: 97.8 fL (ref 80.0–100.0)
Monocytes Absolute: 0.5 10*3/uL (ref 0.1–1.0)
Monocytes Relative: 7 %
Neutro Abs: 4.6 10*3/uL (ref 1.7–7.7)
Neutrophils Relative %: 64 %
Platelet Count: 223 10*3/uL (ref 150–400)
RBC: 3.57 MIL/uL — ABNORMAL LOW (ref 3.87–5.11)
RDW: 13 % (ref 11.5–15.5)
WBC Count: 7.2 10*3/uL (ref 4.0–10.5)
nRBC: 0 % (ref 0.0–0.2)

## 2023-02-09 LAB — CMP (CANCER CENTER ONLY)
ALT: 18 U/L (ref 0–44)
AST: 17 U/L (ref 15–41)
Albumin: 4.5 g/dL (ref 3.5–5.0)
Alkaline Phosphatase: 52 U/L (ref 38–126)
Anion gap: 9 (ref 5–15)
BUN: 15 mg/dL (ref 8–23)
CO2: 27 mmol/L (ref 22–32)
Calcium: 9.7 mg/dL (ref 8.9–10.3)
Chloride: 100 mmol/L (ref 98–111)
Creatinine: 0.8 mg/dL (ref 0.44–1.00)
GFR, Estimated: >60 mL/min (ref >60)
Glucose, Bld: 146 mg/dL — ABNORMAL HIGH (ref 70–99)
Potassium: 3.7 mmol/L (ref 3.5–5.1)
Sodium: 136 mmol/L (ref 135–145)
Total Bilirubin: 0.9 mg/dL (ref 0.3–1.2)
Total Protein: 6.9 g/dL (ref 6.5–8.1)

## 2023-02-09 NOTE — Progress Notes (Signed)
Patient Care Team: Early, Coralee Pesa, NP as PCP - General (Nurse Practitioner) Janina Mayo, MD as PCP - Cardiology (Cardiology) Megan Salon, MD as Consulting Physician (Gynecology) Harold Hedge, Darrick Grinder, MD (Allergy and Immunology) Truitt Merle, MD as Consulting Physician (Hematology) Jovita Kussmaul, MD as Consulting Physician (General Surgery) Eppie Gibson, MD as Attending Physician (Radiation Oncology) Delice Bison Charlestine Massed, NP as Nurse Practitioner (Hematology and Oncology)   CHIEF COMPLAINT: Follow up left breast cancer   Oncology History Overview Note  Cancer Staging Malignant neoplasm of upper-inner quadrant of left breast in female, estrogen receptor positive ([place]) Staging form: Breast, AJCC 8th Edition - Clinical stage from 04/19/2017: Stage IA (cT1b, cN0, cM0, G2, ER: Positive, PR: Positive, HER2: Negative) - Signed by Truitt Merle, MD on 04/28/2017 - Pathologic stage from 05/17/2017: Stage IA (pT1c, pN0, cM0, G2, ER: Positive, PR: Positive, HER2: Negative) - Signed by Truitt Merle, MD on 07/24/2017     Malignant neoplasm of upper-inner quadrant of left breast in female, estrogen receptor positive ([place])  04/15/2017 Mammogram   Diagnostic mammogram and ultrasound of left breast:  The new 1.2 cm oval solid mass in the left breast 11:00 position, posterior depth, is highly suspicious for malignancy. The grouped mucus of calcium calcifications in the left breast at the 1:00 and 10:00 middle depth are benign. Ultrasound of the left axilla was negative for adenopathy.   04/19/2017 Receptors her2   Estrogen Receptor: 100%, POSITIVE, STRONG STAINING INTENSITY Progesterone Receptor: 100%, POSITIVE, STRONG STAINING INTENSITY Proliferation Marker Ki67: 30% HER2 (-)   04/19/2017 Initial Biopsy   Breast, left, needle core biopsy INVASIVE DUCTAL CARCINOMA, GRADE 1 DUCTAL CARCINOMA IN SITU, GRADE 1   04/22/2017 Initial Diagnosis   Malignant neoplasm of upper-inner quadrant of left breast in  female, estrogen receptor positive ([place])   05/17/2017 Surgery   LEFT BREAST LUMPECTOMY WITH RADIOACTIVE SEED AND SENTINEL LYMPH NODE BIOPSY by Dr. Marlou Starks   05/17/2017 Pathology Results   Diagnosis 05/17/17 1. 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 Lymph node, sentinel, biopsy, Left axillary ONE LYMPH NODE, NEGATIVE FOR CARCINOMA (0/1) 3. Lymph node, sentinel, biopsy, Left axillary ONE LYMPH NODE, NEGATIVE FOR CARCINOMA (0/1) 4. Breast, excision, Left new anterior margin SMALL FOCUS OF DUCTAL CARCINOMA IN SITU THE CARCINOMA IS 1 MM FROM THE ANTERIOR INKED MARGIN 5. Breast, excision, Left new medial margin BENIGN BREAST TISSUE NEGATIVE FOR CARCINOMA 6. Breast, excision, Left new superior margin FIBROCYSTIC CHANGES WITH CALCIFICATIONS NEGATIVE FOR CARCINOMA 7. Breast, excision, Left new lateral margin FIBROCYSTIC CHANGES WITH CALCIFICATIONS USUAL DUCTAL HYPERPLASIA NEGATIVE FOR CARCINOMA     05/17/2017 Miscellaneous   Oncotype   Recurrence score: 0 Low Risk 10-year risk of recurrence of 3% with Tamoxifen alone   06/24/2017 - 07/23/2017 Radiation Therapy   Radiation with Dr. Isidore Moos   07/2017 -  Anti-estrogen oral therapy   Anastrozole 1 mg daily     04/21/2018 Mammogram   04/21/2018 Mammogram Benign      CURRENT THERAPY: Anastrozole 1 mg, starting 07/2017; goal 5-7 years  INTERVAL HISTORY Ms. Cassata returns for follow up as scheduled. Last seen by Dr. Burr Medico 02/06/22.  Doing well in the interim.  Mammogram 05/21/22 was benign. DEXA 05/2022 with mild osteopenia and low frax score.  She was found  to be in A-fib at routine PCP follow-up, started on aspirin and metoprolol.  She is not symptomatic with episodes but can tell when she is having one.  She still has "random scar  tissue pain" which is the same since surgery, denies new lump/mass, nipple discharge or inversion, or skin change.  Denies side effects from anastrozole.  ROS  All other systems reviewed and negative  Past Medical History:  Diagnosis Date   Allergy    Anemia    hx   GERD (gastroesophageal reflux disease)    History of abnormal cervical Pap smear    HSV infection    h/o   HTN (hypertension)    Malignant neoplasm of upper-inner quadrant of left breast in female, estrogen receptor positive ([place]) 04/22/2017   Ovarian cyst    h/o   Perennial allergic rhinitis    [place]    Seasonal allergic rhinitis    [place]     Past Surgical History:  Procedure Laterality Date   ADENOIDECTOMY     only adenoids still have tonsils   BREAST LUMPECTOMY WITH RADIOACTIVE SEED AND SENTINEL LYMPH NODE BIOPSY Left 05/17/2017   Procedure: LEFT BREAST LUMPECTOMY WITH RADIOACTIVE SEED AND SENTINEL LYMPH NODE BIOPSY;  Surgeon: Jovita Kussmaul, MD;  Location: [facility] OR;  Service: General;  Laterality: Left;   CESAREAN SECTION     x3   excision of precancerous mole     lft arm, chest     Outpatient Encounter Medications as of 02/09/2023  Medication Sig   amLODipine (NORVASC) 5 MG tablet TAKE 1 TABLET(5 MG) BY MOUTH DAILY   anastrozole (ARIMIDEX) 1 MG tablet TAKE 1 TABLET(1 MG) BY MOUTH DAILY   aspirin EC 81 MG tablet Take 1 tablet (81 mg total) by mouth daily. Swallow whole.   famotidine (PEPCID) 20 MG tablet Take 1 tablet (20 mg total) by mouth daily as needed for heartburn or indigestion.   fexofenadine (ALLERGY RELIEF) 180 MG tablet Take 1 tablet (180 mg total) by mouth as needed.   IBUPROFEN PO Take by mouth as needed.   losartan-hydrochlorothiazide (HYZAAR) 100-12.5 MG tablet TAKE 1 TABLET BY MOUTH DAILY   metoprolol tartrate (LOPRESSOR) 25 MG tablet TAKE 1 TABLET(25 MG) BY MOUTH TWICE DAILY   nitrofurantoin, macrocrystal-monohydrate, (MACROBID) 100 MG capsule Take 1 tab daily prn UTI prophylaxis    valACYclovir (VALTREX) 1000 MG tablet 2 grams x 1, repeat in 12 hours.  Use with symptom onset.   No facility-administered encounter medications on file as of 02/09/2023.     Today's Vitals   02/09/23 1020  BP: 115/61  Pulse: 70  Resp: 18  Temp: 97.6 F (36.4 C)  TempSrc: Oral  SpO2: 99%  Weight: 189 lb 8 oz (86 kg)  Height: 5' 5.5" (1.664 m)   Body mass index is 31.05 kg/m.   PHYSICAL EXAM GENERAL:alert, no distress and comfortable SKIN: no rash  EYES: sclera clear NECK: without mass LYMPH:  no palpable cervical or supraclavicular lymphadenopathy  LUNGS:  normal breathing effort HEART:  no lower extremity edema ABDOMEN: abdomen soft, non-tender and normal bowel sounds NEURO: alert & oriented x 3 with fluent speech, no focal motor/sensory deficits Breast exam: No nipple discharge or inversion.  S/p left lumpectomy and radiation, incisions completely healed.  No palpable mass or nodularity in either breast or axilla that I could appreciate.   CBC    Component Value Date/Time   WBC 7.2 02/09/2023 1004   WBC 7.7 02/06/2022 1052   RBC  3.57 (L) 02/09/2023 1004   HGB 12.3 02/09/2023 1004   HGB 14.2 09/14/2022 1003   HGB 12.8 04/28/2017 0841   HCT 34.9 (L) 02/09/2023 1004   HCT 41.7 09/14/2022 1003   HCT 39.1 04/28/2017 0841   PLT 223 02/09/2023 1004   PLT 291 09/14/2022 1003   MCV 97.8 02/09/2023 1004   MCV 98 (H) 09/14/2022 1003   MCV 96.3 04/28/2017 0841   MCH 34.5 (H) 02/09/2023 1004   MCHC 35.2 02/09/2023 1004   RDW 13.0 02/09/2023 1004   RDW 11.9 09/14/2022 1003   RDW 12.0 04/28/2017 0841   LYMPHSABS 1.7 02/09/2023 1004   LYMPHSABS 1.9 09/14/2022 1003   LYMPHSABS 1.6 04/28/2017 0841   MONOABS 0.5 02/09/2023 1004   MONOABS 0.5 04/28/2017 0841   EOSABS 0.3 02/09/2023 1004   EOSABS 0.2 09/14/2022 1003   BASOSABS 0.1 02/09/2023 1004   BASOSABS 0.1 09/14/2022 1003   BASOSABS 0.0 04/28/2017 0841     CMP     Component Value Date/Time   NA 136 02/09/2023  1004   NA 135 09/14/2022 1003   NA 140 04/28/2017 0841   K 3.7 02/09/2023 1004   K 3.7 04/28/2017 0841   CL 100 02/09/2023 1004   CO2 27 02/09/2023 1004   CO2 27 04/28/2017 0841   GLUCOSE 146 (H) 02/09/2023 1004   GLUCOSE 109 04/28/2017 0841   BUN 15 02/09/2023 1004   BUN 12 09/14/2022 1003   BUN 14.7 04/28/2017 0841   CREATININE 0.80 02/09/2023 1004   CREATININE 0.7 04/28/2017 0841   CALCIUM 9.7 02/09/2023 1004   CALCIUM 9.7 04/28/2017 0841   PROT 6.9 02/09/2023 1004   PROT 7.2 09/14/2022 1003   PROT 7.0 04/28/2017 0841   ALBUMIN 4.5 02/09/2023 1004   ALBUMIN 4.8 09/14/2022 1003   ALBUMIN 4.2 04/28/2017 0841   AST 17 02/09/2023 1004   AST 21 04/28/2017 0841   ALT 18 02/09/2023 1004   ALT 21 04/28/2017 0841   ALKPHOS 52 02/09/2023 1004   ALKPHOS 68 04/28/2017 0841   BILITOT 0.9 02/09/2023 1004   BILITOT 0.58 04/28/2017 0841   GFRNONAA >60 02/09/2023 1004   GFRAA 118 08/16/2020 1013     ASSESSMENT & PLAN: TAHTIANA BARSNESS is a 62 y.o. caucasian female who has a history of Anemia, HTN, and Ovarian cysts.     1.  Malignant neoplasm of upper-inner quadrant of left breast in female, pT1cN0M0, stage IA, ER+/PR+/HER2-,  Grade 2 -diagnosed 04/2017, s/p lumpectomy, adjuvant radiation, and adjuvant AI with anastrozole. Oncotype recurrence score is 0, no benefit of chemotherapy for low risk group.  -She began adjuvant antiestrogen therapy with anastrozole in 06/2017, tolerating well. Plan to continue for total 5-7 years. -mammogram 05/2022 benign; at [facility] -Ms. Glaub is clinically doing well.  Tolerating anastrozole without significant side effects.  Breast exam is benign, labs are unremarkable.  Overall no clinical concern for breast cancer recurrence -Continue breast cancer surveillance and anastrozole (refilled) which she will complete 06/2024 -Continue annual PCP and oncology follow-ups, staggered 6 months apart   2. HTN, A-fib -on amlodipine and Hyzaar -She was found to have  A-fib on routine PCP visit, referred to cardiology, and started on aspirin and metoprolol   3. Osteopenia  -Her 11/24/2017, 01/2020, and 06/01/2022 DEXA shows mild osteopenia, overall mostly stable with low FRAX score.  -continue calcium and vitamin D, weightbearing exercise -Repeat 05/2024   PLAN: -Labs reviewed -Continue breast cancer surveillance  -Continue Anastrozole through 06/2024 (refilled) -F/up  in 1 year, or sooner if needed    All questions were answered. The patient knows to call the clinic with any problems, questions or concerns. No barriers to learning were detected.    Cira Rue, NP-C 02/09/2023

## 2023-02-25 ENCOUNTER — Telehealth: Payer: Self-pay | Admitting: Internal Medicine

## 2023-02-25 NOTE — Telephone Encounter (Signed)
Patient is requesting to switch from Dr. Wyline Mood to Dr. Cristal Deer. She prefers the [facility] office because all her other doctors are there.  Please advise, Thank you

## 2023-02-26 NOTE — Telephone Encounter (Signed)
OK by me 

## 2023-04-06 ENCOUNTER — Ambulatory Visit: Payer: BC Managed Care – PPO | Admitting: Orthopaedic Surgery

## 2023-04-06 ENCOUNTER — Other Ambulatory Visit (INDEPENDENT_AMBULATORY_CARE_PROVIDER_SITE_OTHER): Payer: BC Managed Care – PPO

## 2023-04-06 DIAGNOSIS — M25551 Pain in right hip: Secondary | ICD-10-CM | POA: Diagnosis not present

## 2023-04-06 MED ORDER — PREDNISONE 10 MG (21) PO TBPK: ORAL_TABLET | ORAL | 3 refills | Status: DC

## 2023-04-06 MED ORDER — DICLOFENAC SODIUM 75 MG PO TBEC
75.0000 mg | DELAYED_RELEASE_TABLET | Freq: Two times a day (BID) | ORAL | 2 refills | Status: DC
Start: 2023-04-06 — End: 2023-11-01

## 2023-04-06 NOTE — Progress Notes (Signed)
Office Visit Note   Patient: Kristina Sanders           Date of Birth: 1961-01-12           MRN: [id number] Visit Date: 04/06/2023              Requested by: Alyson Reedy, FNP [address] [place],  [place] [address] PCP: Alyson Reedy, FNP   Assessment & Plan: Visit Diagnoses:  1. Pain in right hip     Plan: Impression 62 year old female with lateral right hip pain.  Possible causes of pain and explained and treatment options were discussed and she would like to try a prednisone Dosepak followed by 2 weeks of an NSAID.  She will follow-up if symptoms persist.  Follow-Up Instructions: No follow-ups on file.   Orders:  Orders Placed This Encounter  Procedures   XR HIP UNILAT W OR W/O PELVIS 2-3 VIEWS RIGHT   Meds ordered this encounter  Medications   predniSONE (STERAPRED UNI-PAK 21 TAB) 10 MG (21) TBPK tablet    Sig: Take as directed    Dispense:  21 tablet    Refill:  3   diclofenac (VOLTAREN) 75 MG EC tablet    Sig: Take 1 tablet (75 mg total) by mouth 2 (two) times daily.    Dispense:  30 tablet    Refill:  2      Procedures: No procedures performed   Clinical Data: No additional findings.   Subjective: Chief Complaint  Patient presents with   Right Hip - Pain    HPI Patient is a very pleasant 62 year old female previous patient of Dr. Cleophas Dunker who comes in for right hip pain to the lateral side.  Denies any injuries or back pain or radicular symptoms.  Sometimes it prevents her from sleeping.  Takes Advil for the pain.  In the last couple weeks has gotten worse and is causing limping.  She is leaving for [facility] soon.   Review of Systems  Constitutional: Negative.   HENT: Negative.    Eyes: Negative.   Respiratory: Negative.    Cardiovascular: Negative.   Endocrine: Negative.   Musculoskeletal: Negative.   Neurological: Negative.   Hematological: Negative.   Psychiatric/Behavioral: Negative.    All other systems reviewed and  are negative.    Objective: Vital Signs: LMP 07/17/2013   Physical Exam Vitals and nursing note reviewed.  Constitutional:      Appearance: She is well-developed.  HENT:     Head: Atraumatic.     Nose: Nose normal.  Eyes:     Extraocular Movements: Extraocular movements intact.  Cardiovascular:     Pulses: Normal pulses.  Pulmonary:     Effort: Pulmonary effort is normal.  Abdominal:     Palpations: Abdomen is soft.  Musculoskeletal:     Cervical back: Neck supple.  Skin:    General: Skin is warm.     Capillary Refill: Capillary refill takes less than 2 seconds.  Neurological:     Mental Status: She is alert. Mental status is at baseline.  Psychiatric:        Behavior: Behavior normal.        Thought Content: Thought content normal.        Judgment: Judgment normal.     Ortho Exam Examination right hip shows a good range of motion with reproducible pain with external rotation and FABER.  Negative Stinchfield.  She has quite a bit of tenderness to the lateral hip and  trochanteric region.  No sciatic tension signs. Specialty Comments:  No specialty comments available.  Imaging: No results found.   PMFS History: Patient Active Problem List   Diagnosis Date Noted   Gastroesophageal reflux disease 09/14/2022   Seasonal allergies 09/14/2022   Irregular heartbeat 09/14/2022   Atrial fibrillation (HCC) 09/14/2022   Encounter for annual physical exam 04/11/2021   Retrocalcaneal bursitis 06/11/2020   Achilles tendinitis, right leg 05/16/2020   Prediabetes 06/01/2018   Malignant neoplasm of upper-inner quadrant of left breast in female, 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   Allergic rhinitis due to other allergen 10/08/2012   Essential hypertension    Past Medical History:  Diagnosis Date   Allergy    Anemia    hx   GERD (gastroesophageal reflux disease)    History of abnormal cervical Pap smear    HSV infection    h/o   HTN (hypertension)     Malignant neoplasm of upper-inner quadrant of left breast in female, 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   Ovarian cyst    h/o   Perennial allergic rhinitis    [place]    Seasonal allergic rhinitis    [place]    Family History  Problem Relation Age of Onset   Mitral valve prolapse Mother    Alzheimer's disease Mother    Prostate cancer Father    Transient ischemic attack Father    Breast cancer Paternal Aunt 40       bile duct cancer   Colon cancer Neg Hx    Rectal cancer Neg Hx    Stomach cancer Neg Hx    Esophageal cancer Neg Hx     Past Surgical History:  Procedure Laterality Date   ADENOIDECTOMY     only adenoids still have tonsils   BREAST LUMPECTOMY WITH RADIOACTIVE SEED AND SENTINEL LYMPH NODE BIOPSY Left 05/17/2017   Procedure: LEFT BREAST LUMPECTOMY WITH RADIOACTIVE SEED AND SENTINEL LYMPH NODE BIOPSY;  Surgeon: Griselda Miner, MD;  Location: [facility] OR;  Service: General;  Laterality: Left;   CESAREAN SECTION     x3   excision of precancerous mole     lft arm, chest   Social History   Occupational History   Not on file  Tobacco Use   Smoking status: Never   Smokeless tobacco: Never  Vaping Use   Vaping Use: Never used  Substance and Sexual Activity   Alcohol use: Yes    Alcohol/week: 2.0 standard drinks of alcohol    Types: 2 Standard drinks or equivalent per week   Drug use: No   Sexual activity: Yes    Partners: Male    Birth control/protection: Other-see comments    Comment: vasectomy

## 2023-04-27 ENCOUNTER — Ambulatory Visit (HOSPITAL_BASED_OUTPATIENT_CLINIC_OR_DEPARTMENT_OTHER): Payer: BC Managed Care – PPO | Admitting: Cardiology

## 2023-04-27 ENCOUNTER — Encounter (HOSPITAL_BASED_OUTPATIENT_CLINIC_OR_DEPARTMENT_OTHER): Payer: Self-pay | Admitting: Cardiology

## 2023-04-27 VITALS — BP 102/62 | HR 74 | Ht 65.5 in | Wt 186.6 lb

## 2023-04-27 DIAGNOSIS — Z7189 Other specified counseling: Secondary | ICD-10-CM | POA: Diagnosis not present

## 2023-04-27 DIAGNOSIS — I480 Paroxysmal atrial fibrillation: Secondary | ICD-10-CM | POA: Diagnosis not present

## 2023-04-27 DIAGNOSIS — D6869 Other thrombophilia: Secondary | ICD-10-CM | POA: Diagnosis not present

## 2023-04-27 DIAGNOSIS — Z7901 Long term (current) use of anticoagulants: Secondary | ICD-10-CM | POA: Diagnosis not present

## 2023-04-27 MED ORDER — APIXABAN 5 MG PO TABS
5.0000 mg | ORAL_TABLET | Freq: Two times a day (BID) | ORAL | 11 refills | Status: DC
Start: 2023-04-27 — End: 2024-05-09

## 2023-04-27 MED ORDER — APIXABAN 5 MG PO TABS: 5.0000 mg | ORAL_TABLET | Freq: Two times a day (BID) | ORAL | 0 refills | Status: DC

## 2023-04-27 MED ORDER — METOPROLOL SUCCINATE ER 50 MG PO TB24
50.0000 mg | ORAL_TABLET | Freq: Every day | ORAL | 3 refills | Status: DC
Start: 2023-04-27 — End: 2024-04-26

## 2023-04-27 NOTE — Patient Instructions (Addendum)
Medication Instructions:  STOP ASPIRIN   START ELIQUIS 5 MG TWICE A DAY   STOP METOPROLOL TART  START METOPROLOL SUCC 50 MG DAILY   *If you need a refill on your cardiac medications before your next appointment, please call your pharmacy*   Lab Work: NONE  Testing/Procedures: NONE  Follow-Up: At [facility], you and your health needs are our priority.  As part of our continuing mission to provide you with exceptional heart care, we have created designated Provider Care Teams.  These Care Teams include your primary Cardiologist (physician) and Advanced Practice Providers (APPs -  Physician Assistants and Nurse Practitioners) who all work together to provide you with the care you need, when you need it.  We recommend signing up for the patient portal called "MyChart".  Sign up information is provided on this After Visit Summary.  MyChart is used to connect with patients for Virtual Visits (Telemedicine).  Patients are able to view lab/test results, encounter notes, upcoming appointments, etc.  Non-urgent messages can be sent to your provider as well.   To learn more about what you can do with MyChart, go to [email or web address].    Your next appointment:   6 month(s)  Provider:   Jodelle Red, MD

## 2023-04-27 NOTE — Progress Notes (Signed)
Cardiology Office Note:  .   Date:  04/27/2023  ID:  Almond Lint, DOB 09/05/1961, MRN [id number] PCP: Alyson Reedy, FNP  [facility] Providers Cardiologist:  Jodelle Red, MD {  History of Present Illness: .   Kristina Sanders is a 62 y.o. female with PMH paroxysmal atrial fibrillation not on anticoagulation, hypertension, history of stage 1a breast cancer s/p surgery, radiation, anastrozole.   Today: Feels when she has afib, happens once every 6-8 weeks, can last several hours. Worse when she feels stress, dehydrated. Sometimes forgets to take her PM dose of metoprolol.  Father with history of CVAs, probably afib. Mother had afib.   ROS: Denies chest pain, shortness of breath at rest or with normal exertion. No PND, orthopnea, LE edema or unexpected weight gain. No syncope. ROS otherwise negative except as noted.   Studies Reviewed: Marland Kitchen    EKG:  NSR at 74  Echo 09/2022: EF 60-65%. No significant valve disease.  Physical Exam:   VS:  BP 102/62 (BP Location: Right Arm, Patient Position: Sitting, Cuff Size: Normal)   Pulse 74   Ht 5' 5.5" (1.664 m)   Wt 186 lb 9.6 oz (84.6 kg)   LMP 07/17/2013   BMI 30.58 kg/m    Wt Readings from Last 3 Encounters:  04/27/23 186 lb 9.6 oz (84.6 kg)  02/09/23 189 lb 8 oz (86 kg)  11/10/22 178 lb (80.7 kg)    GEN: Well nourished, well developed in no acute distress HEENT: Normal, moist mucous membranes NECK: No JVD CARDIAC: regular rhythm, normal S1 and S2, no rubs or gallops. No murmur. VASCULAR: Radial and DP pulses 2+ bilaterally. No carotid bruits RESPIRATORY:  Clear to auscultation without rales, wheezing or rhonchi  ABDOMEN: Soft, non-tender, non-distended MUSCULOSKELETAL:  Ambulates independently SKIN: Warm and dry, no edema NEUROLOGIC:  Alert and oriented x 3. No focal neuro deficits noted. PSYCHIATRIC:  Normal affect    ASSESSMENT AND PLAN: .   Paroxysmal atrial fibrillation -CHA2DS2/VAS Stroke Risk  Points =2 -discussed aspirin vs. DOAC. She is a 2 (one of which is for gender). Discussed apixaban vs. Rivaroxaban. Stop aspirin, start apixaban. -discussed antiarrhythmic, ablation today. She would like to hold on this for now -change metoprolol to succinate  Hypertension -well controlled today  Hypercholesterolemia -LDL 179 -history of radiation with prior breast cancer. May skew calcifications -discussed today, continue to discuss at follow up  CV risk counseling and prevention -recommend heart healthy/Mediterranean diet, with whole grains, fruits, vegetable, fish, lean meats, nuts, and olive oil. Limit salt. -recommend moderate walking, 3-5 times/week for 30-50 minutes each session. Aim for at least 150 minutes.week. Goal should be pace of 3 miles/hours, or walking 1.5 miles in 30 minutes -recommend avoidance of tobacco products. Avoid excess alcohol. -ASCVD risk score: The 10-year ASCVD risk score (Arnett DK, et al., 2019) is: 3.4%   Values used to calculate the score:     Age: 67 years     Sex: Female     Is Non-Hispanic African American: No     Diabetic: No     Tobacco smoker: No     Systolic Blood Pressure: 102 mmHg     Is BP treated: Yes     HDL Cholesterol: 66 mg/dL     Total Cholesterol: 266 mg/dL    Dispo: 6 months  Signed, Jodelle Red, MD   Jodelle Red, MD, PhD, [facility] [place]  [facility]  [place]  [facility] at [facility]  at [facility] [address], [address] [place], [place] [address] [phone]

## 2023-05-15 ENCOUNTER — Other Ambulatory Visit (HOSPITAL_BASED_OUTPATIENT_CLINIC_OR_DEPARTMENT_OTHER): Payer: Self-pay | Admitting: Nurse Practitioner

## 2023-05-15 DIAGNOSIS — I4891 Unspecified atrial fibrillation: Secondary | ICD-10-CM

## 2023-05-15 NOTE — Telephone Encounter (Signed)
This was increased

## 2023-06-10 ENCOUNTER — Encounter (HOSPITAL_BASED_OUTPATIENT_CLINIC_OR_DEPARTMENT_OTHER): Payer: Self-pay | Admitting: *Deleted

## 2023-07-27 ENCOUNTER — Other Ambulatory Visit: Payer: Self-pay | Admitting: Ophthalmology

## 2023-07-29 LAB — DERMATOLOGY PATHOLOGY

## 2023-09-23 ENCOUNTER — Ambulatory Visit (INDEPENDENT_AMBULATORY_CARE_PROVIDER_SITE_OTHER): Payer: BC Managed Care – PPO | Admitting: Family Medicine

## 2023-09-23 ENCOUNTER — Encounter (HOSPITAL_BASED_OUTPATIENT_CLINIC_OR_DEPARTMENT_OTHER): Payer: Self-pay | Admitting: Family Medicine

## 2023-09-23 VITALS — BP 128/62 | HR 66 | Temp 98.0°F | Ht 65.5 in | Wt 185.5 lb

## 2023-09-23 DIAGNOSIS — I10 Essential (primary) hypertension: Secondary | ICD-10-CM | POA: Diagnosis not present

## 2023-09-23 DIAGNOSIS — M25511 Pain in right shoulder: Secondary | ICD-10-CM | POA: Insufficient documentation

## 2023-09-23 DIAGNOSIS — Z0000 Encounter for general adult medical examination without abnormal findings: Secondary | ICD-10-CM | POA: Insufficient documentation

## 2023-09-23 DIAGNOSIS — I480 Paroxysmal atrial fibrillation: Secondary | ICD-10-CM

## 2023-09-23 MED ORDER — AMLODIPINE BESYLATE 5 MG PO TABS
5.0000 mg | ORAL_TABLET | Freq: Every day | ORAL | 3 refills | Status: DC
Start: 2023-09-23 — End: 2024-09-25

## 2023-09-23 MED ORDER — LOSARTAN POTASSIUM-HCTZ 100-12.5 MG PO TABS: 1.0000 | ORAL_TABLET | Freq: Every day | ORAL | 3 refills | Status: DC

## 2023-09-23 NOTE — Progress Notes (Signed)
Complete physical exam  Patient: Kristina Sanders   DOB: 1961-08-15   62 y.o. Female  MRN: [id number]  Subjective:    Chief Complaint  Patient presents with   Annual Exam   Kristina Sanders is a 62 y.o. female who presents today for a complete physical exam. She reports consuming a general and healthy  diet.  She participates in walking about 30 minutes a few times weekly  She generally feels well. She reports sleeping well. She does have additional problems to discuss today. She has concerns about her R shoulder- reports minimal pain after pushing her luggage in the airport and having it not move forward- this occurred in May 2024. She reports minimal pain but it is noticeable when she does every day movements, such as pushing forward and internal & external rotation (ex: while holding the steering wheel and turning). Denies weakness, numbness/tingling,   HEALTH SCREENINGS: - Vision Screening: up to date, Aug 2024 - Dental Visits: Oct 2024 - Pap smear: up to date - Breast Exam: up to date - STD Screening: Declined - Mammogram (40+): Up to date, done 05/2023 - Colonoscopy (45+): Up to date  - Bone Density (65+ or under 65 with predisposing conditions): done 06/01/2022 with mild osteopenia, repeat 05/2024 - Lung CA screening with low-dose CT:  Not applicable Adults age 27-80 who are current cigarette smokers or quit within the last 15 years. Must have 20 pack year history.   IMMUNIZATIONS: - Tdap: Tetanus vaccination status reviewed: last tetanus booster within 10 years. - HPV: Not applicable - Influenza: Given elsewhere - Pneumovax:  - Prevnar 20:  - Zostavax (50+): Up to date   Depression screenings:    09/17/2022    3:28 PM 09/11/2021    3:02 PM 09/11/2021    9:26 AM  Depression screen PHQ 2/9  Decreased Interest 0 0 0  Down, Depressed, Hopeless 1 0 0  PHQ - 2 Score 1 0 0    Patient Care Team: Alyson Reedy, FNP as PCP - General (Family Medicine) Jodelle Red, MD as PCP - Cardiology (Cardiology) Jerene Bears, MD as Consulting Physician (Gynecology) Irena Cords, Enzo Montgomery, MD (Allergy and Immunology) Malachy Mood, MD as Consulting Physician (Hematology) Griselda Miner, MD as Consulting Physician (General Surgery) Lonie Peak, MD as Attending Physician (Radiation Oncology) Axel Filler, Larna Daughters, NP as Nurse Practitioner (Hematology and Oncology) Jodelle Red, MD as Consulting Physician (Cardiology)   Outpatient Medications Prior to Visit  Medication Sig   anastrozole (ARIMIDEX) 1 MG tablet TAKE 1 TABLET(1 MG) BY MOUTH DAILY   apixaban (ELIQUIS) 5 MG TABS tablet Take 1 tablet (5 mg total) by mouth 2 (two) times daily.   diclofenac (VOLTAREN) 75 MG EC tablet Take 1 tablet (75 mg total) by mouth 2 (two) times daily.   fexofenadine (ALLERGY RELIEF) 180 MG tablet Take 1 tablet (180 mg total) by mouth as needed.   IBUPROFEN PO Take by mouth as needed.   nitrofurantoin, macrocrystal-monohydrate, (MACROBID) 100 MG capsule Take 1 tab daily prn UTI prophylaxis   valACYclovir (VALTREX) 1000 MG tablet 2 grams x 1, repeat in 12 hours.  Use with symptom onset.   [DISCONTINUED] amLODipine (NORVASC) 5 MG tablet TAKE 1 TABLET(5 MG) BY MOUTH DAILY   [DISCONTINUED] losartan-hydrochlorothiazide (HYZAAR) 100-12.5 MG tablet TAKE 1 TABLET BY MOUTH DAILY   apixaban (ELIQUIS) 5 MG TABS tablet Take 1 tablet (5 mg total) by mouth 2 (two) times daily for 14 days.   metoprolol succinate (  TOPROL-XL) 50 MG 24 hr tablet Take 1 tablet (50 mg total) by mouth daily. Take with or immediately following a meal.   [DISCONTINUED] famotidine (PEPCID) 20 MG tablet Take 1 tablet (20 mg total) by mouth daily as needed for heartburn or indigestion. (Patient not taking: Reported on 09/23/2023)   No facility-administered medications prior to visit.    Review of Systems  Constitutional:  Negative for malaise/fatigue and weight loss.  Eyes:  Negative for blurred vision  and double vision.  Respiratory:  Negative for cough and shortness of breath.   Cardiovascular:  Negative for chest pain, palpitations and leg swelling.  Gastrointestinal:  Negative for abdominal pain, nausea and vomiting.  Genitourinary:  Negative for frequency and urgency.  Musculoskeletal:  Positive for joint pain (R shoulder). Negative for myalgias.  Neurological:  Negative for dizziness, weakness and headaches.  Psychiatric/Behavioral:  Negative for depression and suicidal ideas. The patient is not nervous/anxious and does not have insomnia.        Objective:     BP 128/62   Pulse 66   Temp 98 F (36.7 C) (Oral)   Ht 5' 5.5" (1.664 m)   Wt 185 lb 8 oz (84.1 kg)   LMP 07/17/2013   SpO2 100%   BMI 30.40 kg/m  BP Readings from Last 3 Encounters:  09/23/23 128/62  04/27/23 102/62  02/09/23 115/61     Physical Exam Vitals reviewed.  Constitutional:      Appearance: Normal appearance.  HENT:     Head: Normocephalic.     Right Ear: Tympanic membrane, ear canal and external ear normal.     Left Ear: Tympanic membrane, ear canal and external ear normal.     Nose: Nose normal.     Mouth/Throat:     Mouth: Mucous membranes are moist.     Pharynx: Oropharynx is clear.  Eyes:     Extraocular Movements: Extraocular movements intact.     Pupils: Pupils are equal, round, and reactive to light.  Cardiovascular:     Rate and Rhythm: Normal rate and regular rhythm.     Pulses: Normal pulses.     Heart sounds: Normal heart sounds.  Pulmonary:     Effort: Pulmonary effort is normal.     Breath sounds: Normal breath sounds.  Abdominal:     General: Abdomen is flat. Bowel sounds are normal.     Palpations: Abdomen is soft.  Musculoskeletal:        General: Normal range of motion.  Skin:    General: Skin is warm and dry.  Neurological:     Mental Status: She is alert.  Psychiatric:        Mood and Affect: Mood normal.        Behavior: Behavior normal.        Thought  Content: Thought content normal.        Judgment: Judgment normal.         Assessment & Plan:    Routine Health Maintenance and Physical Exam  Health Maintenance  Topic Date Due   COVID-19 Vaccine (7 - 2023-24 season) 11/16/2023   Mammogram  05/26/2024   Colon Cancer Screening  02/21/2025   Pap with HPV screening  09/11/2026   DTaP/Tdap/Td vaccine (3 - Td or Tdap) 04/04/2028   Flu Shot  Completed   Hepatitis C Screening  Completed   Zoster (Shingles) Vaccine  Completed   HPV Vaccine  Aged Out   HIV Screening  Discontinued  Wellness examination Assessment & Plan: Routine HCM labs ordered. Will obtain non-fasting labs today and update patient with results.  Review of PMH, FH, SH, medications and HM performed. Preventative care hand-out provided.  Recommend healthy diet.  Recommend approximately 150 minutes/week of moderate intensity exercise. Recommend regular dental and vision exams. Always use seatbelt/lap and shoulder restraints. Recommend using smoke alarms and checking batteries at least twice a year. Recommend using sunscreen when outside. Discussed immunization recommendations. Vaccines are UTD.    Orders: -     CBC with Differential/Platelet -     Comprehensive metabolic panel -     Hemoglobin A1c -     Lipid panel -     TSH Rfx on Abnormal to Free T4  Essential hypertension Assessment & Plan: Elevated initial blood pressure. Recheck with improved blood pressure. Heart rate normal rate & rhythm during auscultation. Patient in no acute distress and is well-appearing. Denies chest pain, shortness of breath, lower extremity edema, vision changes, headaches. Refills provided to patient.   Orders: -     amLODIPine Besylate; Take 1 tablet (5 mg total) by mouth daily.  Dispense: 90 tablet; Refill: 3 -     Losartan Potassium-HCTZ; Take 1 tablet by mouth daily.  Dispense: 90 tablet; Refill: 3  Acute pain of right shoulder Assessment & Plan: Patient presents today  with acute pain of her right shoulder. She reports this started to bother her in May 2024, after pushing her luggage through the airport and having it get caught on something and not move forward with her. Denies numbness/tingling in her right upper extremity, inability to lift arm above head, weakness, decrease strength. Pulse, motor, and sensation intact. Patient able to forward flex right arm to 180 degrees and able to extend arm to normal 60 degrees. Negative empty can test. Discussed options including imaging, physical therapy, and referral to orthopedics. Advised patient to take NSAIDs as needed and ice shoulder to help reduce pain and inflammation. Patient would like to consider options and contact office in regards to plan of care.    Paroxysmal atrial fibrillation [facility]) Assessment & Plan: Cardiovascular exam with heart regular rate and rhythm. Normal heart sounds, no murmurs present. No lower extremity edema present.       Return in about 1 year (around 09/22/2024) for Physical with fasting labs.     Alyson Reedy, FNP

## 2023-09-23 NOTE — Assessment & Plan Note (Signed)
Patient presents today with acute pain of her right shoulder. She reports this started to bother her in May 2024, after pushing her luggage through the airport and having it get caught on something and not move forward with her. Denies numbness/tingling in her right upper extremity, inability to lift arm above head, weakness, decrease strength. Pulse, motor, and sensation intact. Patient able to forward flex right arm to 180 degrees and able to extend arm to normal 60 degrees. Negative empty can test. Discussed options including imaging, physical therapy, and referral to orthopedics. Advised patient to take NSAIDs as needed and ice shoulder to help reduce pain and inflammation. Patient would like to consider options and contact office in regards to plan of care.

## 2023-09-23 NOTE — Assessment & Plan Note (Signed)
Routine HCM labs ordered. Will obtain non-fasting labs today and update patient with results.  Review of PMH, FH, SH, medications and HM performed. Preventative care hand-out provided.  Recommend healthy diet.  Recommend approximately 150 minutes/week of moderate intensity exercise. Recommend regular dental and vision exams. Always use seatbelt/lap and shoulder restraints. Recommend using smoke alarms and checking batteries at least twice a year. Recommend using sunscreen when outside. Discussed immunization recommendations. Vaccines are UTD.

## 2023-09-23 NOTE — Patient Instructions (Signed)

## 2023-09-23 NOTE — Assessment & Plan Note (Signed)
Elevated initial blood pressure. Recheck with improved blood pressure. Heart rate normal rate & rhythm during auscultation. Patient in no acute distress and is well-appearing. Denies chest pain, shortness of breath, lower extremity edema, vision changes, headaches. Refills provided to patient.

## 2023-09-23 NOTE — Assessment & Plan Note (Signed)
Cardiovascular exam with heart regular rate and rhythm. Normal heart sounds, no murmurs present. No lower extremity edema present.

## 2023-09-24 LAB — COMPREHENSIVE METABOLIC PANEL
ALT: 26 [IU]/L (ref 0–32)
AST: 21 [IU]/L (ref 0–40)
Albumin: 4.4 g/dL (ref 3.9–4.9)
Alkaline Phosphatase: 69 [IU]/L (ref 44–121)
BUN/Creatinine Ratio: 16 (ref 12–28)
BUN: 12 mg/dL (ref 8–27)
Bilirubin Total: 0.5 mg/dL (ref 0.0–1.2)
CO2: 22 mmol/L (ref 20–29)
Calcium: 9.4 mg/dL (ref 8.7–10.3)
Chloride: 92 mmol/L — ABNORMAL LOW (ref 96–106)
Creatinine, Ser: 0.74 mg/dL (ref 0.57–1.00)
Globulin, Total: 2.4 g/dL (ref 1.5–4.5)
Glucose: 124 mg/dL — ABNORMAL HIGH (ref 70–99)
Potassium: 3.9 mmol/L (ref 3.5–5.2)
Sodium: 132 mmol/L — ABNORMAL LOW (ref 134–144)
Total Protein: 6.8 g/dL (ref 6.0–8.5)
eGFR: 91 mL/min/{1.73_m2} (ref >59)

## 2023-09-24 LAB — CBC WITH DIFFERENTIAL/PLATELET
Basophils Absolute: 0 10*3/uL (ref 0.0–0.2)
Basos: 1 %
EOS (ABSOLUTE): 0.3 10*3/uL (ref 0.0–0.4)
Eos: 4 %
Hematocrit: 37.7 % (ref 34.0–46.6)
Hemoglobin: 12.1 g/dL (ref 11.1–15.9)
Immature Grans (Abs): 0 10*3/uL (ref 0.0–0.1)
Immature Granulocytes: 0 %
Lymphocytes Absolute: 1.2 10*3/uL (ref 0.7–3.1)
Lymphs: 20 %
MCH: 32.4 pg (ref 26.6–33.0)
MCHC: 32.1 g/dL (ref 31.5–35.7)
MCV: 101 fL — ABNORMAL HIGH (ref 79–97)
Monocytes Absolute: 0.6 10*3/uL (ref 0.1–0.9)
Monocytes: 10 %
Neutrophils Absolute: 4 10*3/uL (ref 1.4–7.0)
Neutrophils: 65 %
Platelets: 189 10*3/uL (ref 150–450)
RBC: 3.74 x10E6/uL — ABNORMAL LOW (ref 3.77–5.28)
RDW: 11.9 % (ref 11.7–15.4)
WBC: 6.2 10*3/uL (ref 3.4–10.8)

## 2023-09-24 LAB — LIPID PANEL
Chol/HDL Ratio: 4 ratio (ref 0.0–4.4)
Cholesterol, Total: 214 mg/dL — ABNORMAL HIGH (ref 100–199)
HDL: 53 mg/dL (ref >39)
LDL Chol Calc (NIH): 142 mg/dL — ABNORMAL HIGH (ref 0–99)
Triglycerides: 104 mg/dL (ref 0–149)
VLDL Cholesterol Cal: 19 mg/dL (ref 5–40)

## 2023-09-24 LAB — HEMOGLOBIN A1C
Est. average glucose Bld gHb Est-mCnc: 117 mg/dL
Hgb A1c MFr Bld: 5.7 % — ABNORMAL HIGH (ref 4.8–5.6)

## 2023-09-24 LAB — TSH RFX ON ABNORMAL TO FREE T4: TSH: 3.47 u[IU]/mL (ref 0.450–4.500)

## 2023-10-07 ENCOUNTER — Ambulatory Visit (HOSPITAL_BASED_OUTPATIENT_CLINIC_OR_DEPARTMENT_OTHER): Payer: BC Managed Care – PPO | Admitting: Obstetrics & Gynecology

## 2023-10-07 ENCOUNTER — Encounter (HOSPITAL_BASED_OUTPATIENT_CLINIC_OR_DEPARTMENT_OTHER): Payer: Self-pay | Admitting: Family Medicine

## 2023-10-07 ENCOUNTER — Encounter (HOSPITAL_BASED_OUTPATIENT_CLINIC_OR_DEPARTMENT_OTHER): Payer: Self-pay | Admitting: Obstetrics & Gynecology

## 2023-10-07 VITALS — BP 124/61 | HR 73 | Ht 65.5 in | Wt 184.6 lb

## 2023-10-07 DIAGNOSIS — Z87440 Personal history of urinary (tract) infections: Secondary | ICD-10-CM | POA: Diagnosis not present

## 2023-10-07 DIAGNOSIS — Z01419 Encounter for gynecological examination (general) (routine) without abnormal findings: Secondary | ICD-10-CM

## 2023-10-07 DIAGNOSIS — B009 Herpesviral infection, unspecified: Secondary | ICD-10-CM | POA: Diagnosis not present

## 2023-10-07 DIAGNOSIS — Z170 Estrogen receptor positive status [ER+]: Secondary | ICD-10-CM

## 2023-10-07 DIAGNOSIS — C50212 Malignant neoplasm of upper-inner quadrant of left female breast: Secondary | ICD-10-CM

## 2023-10-07 DIAGNOSIS — I480 Paroxysmal atrial fibrillation: Secondary | ICD-10-CM

## 2023-10-07 MED ORDER — VALACYCLOVIR HCL 1 G PO TABS: ORAL_TABLET | ORAL | 1 refills | Status: DC

## 2023-10-07 MED ORDER — NITROFURANTOIN MONOHYD MACRO 100 MG PO CAPS: ORAL_CAPSULE | ORAL | 1 refills | Status: DC

## 2023-10-07 NOTE — Progress Notes (Signed)
62 y.o. [id number] Married White or Caucasian female here for annual exam.  Doing well.  Denies vaginal bleeding.  Hosting almost 30 people next week for Thanksgiving.  Patient's last menstrual period was 07/17/2013.          Sexually active: Yes.    The current method of family planning is post menopausal status.     Health Maintenance: Pap:  09/11/2021 Negative History of abnormal Pap:  no MMG:  05/27/2023 Negative Colonoscopy:  02/22/2015, follow up 10 years BMD:   06/01/2022, mild osteopenia Screening Labs: 09/2023   reports that she has never smoked. She has never used smokeless tobacco. She reports current alcohol use of about 2.0 standard drinks of alcohol per week. She reports that she does not use drugs.  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   Ovarian cyst    h/o   Perennial allergic rhinitis    [place]    Seasonal allergic rhinitis    [place]    Past Surgical History:  Procedure Laterality Date   ADENOIDECTOMY     only adenoids still have tonsils   BREAST LUMPECTOMY WITH RADIOACTIVE SEED AND SENTINEL LYMPH NODE BIOPSY Left 05/17/2017   Procedure: LEFT BREAST LUMPECTOMY WITH RADIOACTIVE SEED AND SENTINEL LYMPH NODE BIOPSY;  Surgeon: Chevis Pretty III, MD;  Location: [facility] OR;  Service: General;  Laterality: Left;   CESAREAN SECTION     x3   excision of precancerous mole     lft arm, chest    Current Outpatient Medications  Medication Sig Dispense Refill   amLODipine (NORVASC) 5 MG tablet Take 1 tablet (5 mg total) by mouth daily. 90 tablet 3   anastrozole (ARIMIDEX) 1 MG tablet TAKE 1 TABLET(1 MG) BY MOUTH DAILY 90 tablet 3   apixaban (ELIQUIS) 5 MG TABS tablet Take 1 tablet (5 mg total) by mouth 2 (two)  times daily. 60 tablet 11   diclofenac (VOLTAREN) 75 MG EC tablet Take 1 tablet (75 mg total) by mouth 2 (two) times daily. 30 tablet 2   fexofenadine (ALLERGY RELIEF) 180 MG tablet Take 1 tablet (180 mg total) by mouth as needed. 90 tablet 3   IBUPROFEN PO Take by mouth as needed.     losartan-hydrochlorothiazide (HYZAAR) 100-12.5 MG tablet Take 1 tablet by mouth daily. 90 tablet 3   nitrofurantoin, macrocrystal-monohydrate, (MACROBID) 100 MG capsule Take 1 tab daily prn UTI prophylaxis 30 capsule 1   valACYclovir (VALTREX) 1000 MG tablet 2 grams x 1, repeat in 12 hours.  Use with symptom onset. 30 tablet 1   apixaban (ELIQUIS) 5 MG TABS tablet Take 1 tablet (5 mg total) by mouth 2 (two) times daily for 14 days. 28 tablet 0   metoprolol succinate (TOPROL-XL) 50 MG 24 hr tablet Take 1 tablet (50 mg total) by mouth daily. Take with or immediately following a meal. 90 tablet 3   No current facility-administered medications for this visit.    Family History  Problem Relation Age of Onset   Mitral valve prolapse Mother    Alzheimer's disease Mother    Prostate cancer Father    Transient ischemic attack Father    Breast cancer Paternal  Aunt 70       bile duct cancer   Colon cancer Neg Hx    Rectal cancer Neg Hx    Stomach cancer Neg Hx    Esophageal cancer Neg Hx     ROS: Constitutional: negative Genitourinary:negative  Exam:   BP 124/61 (BP Location: Right Arm, Patient Position: Sitting, Cuff Size: Large)   Pulse 73   Ht 5' 5.5" (1.664 m)   Wt 184 lb 9.6 oz (83.7 kg)   LMP 07/17/2013   BMI 30.25 kg/m   Height: 5' 5.5" (166.4 cm)  General appearance: alert, cooperative and appears stated age Head: Normocephalic, without obvious abnormality, atraumatic Neck: no adenopathy, supple, symmetrical, trachea midline and thyroid normal to inspection and palpation Lungs: clear to auscultation bilaterally Breasts: normal appearance, no masses or tenderness, well healed scars on left  breast Heart: regular rate and rhythm Abdomen: soft, non-tender; bowel sounds normal; no masses,  no organomegaly Extremities: extremities normal, atraumatic, no cyanosis or edema Skin: Skin color, texture, turgor normal. No rashes or lesions Lymph nodes: Cervical, supraclavicular, and axillary nodes normal. No abnormal inguinal nodes palpated Neurologic: Grossly normal   Pelvic: External genitalia:  no lesions              Urethra:  normal appearing urethra with no masses, tenderness or lesions              Bartholins and Skenes: normal                 Vagina: normal appearing vagina with normal color and no discharge, no lesions              Cervix: no lesions              Pap taken: No. Bimanual Exam:  Uterus:  normal size, contour, position, consistency, mobility, non-tender              Adnexa: normal adnexa and no mass, fullness, tenderness               Rectovaginal: Confirms               Anus:  normal sphincter tone, no lesions  Chaperone, Ina Homes, CMA, was present for exam.  Assessment/Plan: 1. Well woman exam with routine gynecological exam - Pap smear neg with neg HR HPV 2024 - Mammogram 05/2023 - Colonoscopy 2016.  Due next year. - Bone mineral density 2023.  Will be done again next year - lab work done earlier this month - vaccines reviewed/updated  2. History of recurrent UTI (urinary tract infection) - RF done for macrobid - nitrofurantoin, macrocrystal-monohydrate, (MACROBID) 100 MG capsule; Take 1 tab daily prn UTI prophylaxis  Dispense: 30 capsule; Refill: 1  3. HSV (herpes simplex virus) infection - valACYclovir (VALTREX) 1000 MG tablet; 2 grams x 1, repeat in 12 hours.  Use with symptom onset.  Dispense: 30 tablet; Refill: 1  4. Malignant neoplasm of upper-inner quadrant of left breast in female, estrogen receptor positive (HCC) - followed by Dr. Mosetta Putt.  On anastrozole for 1 more year.  Stable exam today  5. Paroxysmal atrial fibrillation (HCC) - on  eliquis and followed by cardiology

## 2023-11-01 ENCOUNTER — Ambulatory Visit (HOSPITAL_BASED_OUTPATIENT_CLINIC_OR_DEPARTMENT_OTHER): Payer: BC Managed Care – PPO | Admitting: Cardiology

## 2023-11-01 ENCOUNTER — Encounter (HOSPITAL_BASED_OUTPATIENT_CLINIC_OR_DEPARTMENT_OTHER): Payer: Self-pay | Admitting: Cardiology

## 2023-11-01 VITALS — BP 110/70 | HR 75 | Ht 65.5 in | Wt 184.4 lb

## 2023-11-01 DIAGNOSIS — D6869 Other thrombophilia: Secondary | ICD-10-CM | POA: Diagnosis not present

## 2023-11-01 DIAGNOSIS — I10 Essential (primary) hypertension: Secondary | ICD-10-CM | POA: Diagnosis not present

## 2023-11-01 DIAGNOSIS — I480 Paroxysmal atrial fibrillation: Secondary | ICD-10-CM

## 2023-11-01 DIAGNOSIS — E7800 Pure hypercholesterolemia, unspecified: Secondary | ICD-10-CM

## 2023-11-01 DIAGNOSIS — Z5181 Encounter for therapeutic drug level monitoring: Secondary | ICD-10-CM

## 2023-11-01 DIAGNOSIS — Z7901 Long term (current) use of anticoagulants: Secondary | ICD-10-CM | POA: Diagnosis not present

## 2023-11-01 DIAGNOSIS — Z7189 Other specified counseling: Secondary | ICD-10-CM

## 2023-11-01 MED ORDER — ROSUVASTATIN CALCIUM 5 MG PO TABS: 5.0000 mg | ORAL_TABLET | Freq: Every day | ORAL | 3 refills | Status: DC

## 2023-11-01 NOTE — Patient Instructions (Signed)
Medication Instructions:  START ROSUVASTATIN 5 MG DAILY  Lab Work: FASTING LP/CMET IN 3 MONTHS   If you have labs (blood work) drawn today and your tests are completely normal, you will receive your results only by: MyChart Message (if you have MyChart) OR A paper copy in the mail If you have any lab test that is abnormal or we need to change your treatment, we will call you to review the results.   Testing/Procedures: NONE  Follow-Up: 04/28/2024 1:20 PM WITH DR Cristal Deer

## 2023-11-01 NOTE — Progress Notes (Signed)
Cardiology Office Note:  .   Date:  11/01/2023  ID:  Kristina Sanders, DOB 1961/03/18, MRN [id number] PCP: Alyson Reedy, FNP  [facility] Providers Cardiologist:  Jodelle Red, MD {  History of Present Illness: .   Kristina Sanders is a 63 y.o. female PMH paroxysmal atrial fibrillation, hypertension, history of stage 1a breast cancer s/p surgery, radiation, anastrozole.   Today: Doing well overall. Doing well on the eliquis. Notes that her A1c and lipids are not at goal. Her husband now has diabetes and is on insulin. She's changed her diet with this and was surprised that her A1c was up to 5.7. Working to increasing activity, discussed today. Enjoyed water aerobics.  She can feel her afib, only had one episode recently, usually about once every 6-8 weeks.    ROS: Denies chest pain, shortness of breath at rest or with normal exertion. No PND, orthopnea, LE edema or unexpected weight gain. No syncope or palpitations. ROS otherwise negative except as noted.   Studies Reviewed: Marland Kitchen    EKG:       Physical Exam:   VS:  BP 110/70   Pulse 75   Ht 5' 5.5" (1.664 m)   Wt 184 lb 6.4 oz (83.6 kg)   LMP 07/17/2013   SpO2 98%   BMI 30.22 kg/m    Wt Readings from Last 3 Encounters:  11/01/23 184 lb 6.4 oz (83.6 kg)  10/07/23 184 lb 9.6 oz (83.7 kg)  09/23/23 185 lb 8 oz (84.1 kg)    GEN: Well nourished, well developed in no acute distress HEENT: Normal, moist mucous membranes NECK: No JVD CARDIAC: regular rhythm, normal S1 and S2, no rubs or gallops. No murmur. VASCULAR: Radial and DP pulses 2+ bilaterally. No carotid bruits RESPIRATORY:  Clear to auscultation without rales, wheezing or rhonchi  ABDOMEN: Soft, non-tender, non-distended MUSCULOSKELETAL:  Ambulates independently SKIN: Warm and dry, no edema NEUROLOGIC:  Alert and oriented x 3. No focal neuro deficits noted. PSYCHIATRIC:  Normal affect    ASSESSMENT AND PLAN: .    Paroxysmal atrial  fibrillation -CHA2DS2/VAS Stroke Risk Points =2 -discussed aspirin vs. DOAC. She is a 2 (one of which is for gender). Tolerating apixaban. -discussed antiarrhythmic, ablation. Consider if symptoms worse -continue metoprolol succinate.   Hypertension -well controlled today   Hypercholesterolemia -LDL 179->142 with diet changes -history of radiation with prior breast cancer. May skew calcifications/Ca score -discussed today, she is amenable to low dose rosuvastatin, will order today -recheck lipids and lfts in 3 mos  CV risk counseling and prevention -recommend heart healthy/Mediterranean diet, with whole grains, fruits, vegetable, fish, lean meats, nuts, and olive oil. Limit salt. -recommend moderate walking, 3-5 times/week for 30-50 minutes each session. Aim for at least 150 minutes.week. Goal should be pace of 3 miles/hours, or walking 1.5 miles in 30 minutes -recommend avoidance of tobacco products. Avoid excess alcohol. -ASCVD risk score: The 10-year ASCVD risk score (Arnett DK, et al., 2019) is: 4.3%   Values used to calculate the score:     Age: 16 years     Sex: Female     Is Non-Hispanic African American: No     Diabetic: No     Tobacco smoker: No     Systolic Blood Pressure: 110 mmHg     Is BP treated: Yes     HDL Cholesterol: 53 mg/dL     Total Cholesterol: 214 mg/dL    Dispo: 6 mos or sooner as needed  Signed,  Jodelle Red, MD   Jodelle Red, MD, PhD, [facility]  [facility]  [place]  [facility] at [facility] at [facility] [address], [address] [place], [place] [address] [phone]

## 2023-11-04 ENCOUNTER — Other Ambulatory Visit (HOSPITAL_BASED_OUTPATIENT_CLINIC_OR_DEPARTMENT_OTHER): Payer: Self-pay | Admitting: Nurse Practitioner

## 2023-11-04 DIAGNOSIS — J302 Other seasonal allergic rhinitis: Secondary | ICD-10-CM

## 2023-11-04 DIAGNOSIS — R7303 Prediabetes: Secondary | ICD-10-CM

## 2023-11-04 DIAGNOSIS — K219 Gastro-esophageal reflux disease without esophagitis: Secondary | ICD-10-CM

## 2023-11-04 DIAGNOSIS — Z0000 Encounter for general adult medical examination without abnormal findings: Secondary | ICD-10-CM

## 2023-11-04 DIAGNOSIS — I10 Essential (primary) hypertension: Secondary | ICD-10-CM

## 2024-01-17 ENCOUNTER — Encounter: Payer: Self-pay | Admitting: Hematology

## 2024-01-18 ENCOUNTER — Other Ambulatory Visit: Payer: Self-pay | Admitting: Hematology

## 2024-01-18 ENCOUNTER — Telehealth: Payer: Self-pay

## 2024-01-18 DIAGNOSIS — C50212 Malignant neoplasm of upper-inner quadrant of left female breast: Secondary | ICD-10-CM

## 2024-01-18 MED ORDER — ANASTROZOLE 1 MG PO TABS: ORAL_TABLET | ORAL | 0 refills | Status: DC

## 2024-01-18 NOTE — Telephone Encounter (Signed)
 LVM for pt stating that Dr. Mosetta Putt would like for the pt to restart Anastrozole (Arimidex) 1mg  tab for 30 days to see if the pt's symptoms starts to resolve.  Informed pt that the prescription was sent to [facility] for her to pickup when it's available from pharmacy.  Stated this nurse will respond to this pt's MyChart message with this information as well.  Instructed pt to contact Dr. Latanya Maudlin office if further discussion is needed.

## 2024-02-04 ENCOUNTER — Ambulatory Visit (HOSPITAL_BASED_OUTPATIENT_CLINIC_OR_DEPARTMENT_OTHER)

## 2024-02-04 ENCOUNTER — Ambulatory Visit (HOSPITAL_BASED_OUTPATIENT_CLINIC_OR_DEPARTMENT_OTHER): Payer: Self-pay | Admitting: Orthopaedic Surgery

## 2024-02-04 DIAGNOSIS — G8929 Other chronic pain: Secondary | ICD-10-CM | POA: Diagnosis not present

## 2024-02-04 DIAGNOSIS — M25511 Pain in right shoulder: Secondary | ICD-10-CM | POA: Diagnosis not present

## 2024-02-04 MED ORDER — TRIAMCINOLONE ACETONIDE 40 MG/ML IJ SUSP
80.0000 mg | INTRAMUSCULAR | Status: AC | PRN
  Administered 2024-02-04: 80 mg via INTRA_ARTICULAR

## 2024-02-04 MED ORDER — LIDOCAINE HCL 1 % IJ SOLN
4.0000 mL | INTRAMUSCULAR | Status: AC | PRN
  Administered 2024-02-04: 4 mL

## 2024-02-04 NOTE — Progress Notes (Signed)
 Chief Complaint: Right shoulder pain     History of Present Illness:    Kristina Sanders is a 63 y.o. female right-hand-dominant female presents with right shoulder pain for almost 1 year now.  She states that she was recently on a trip to [place] and felt like she had aggravated it with lifting and carrying heavy baggage.  She is experiencing the pain predominantly in the front of the shoulder.  She has not trialed any anti-inflammatories or physical therapy.  She has pain with overhead activities and reaching    PMH/PSH/Family History/Social History/Meds/Allergies:    Past Medical History:  Diagnosis Date  . Allergy   . Anemia    hx  . GERD (gastroesophageal reflux disease)   . History of abnormal cervical Pap smear   . HSV infection    h/o  . HTN (hypertension)   . 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 Ovarian cyst    h/o  . Perennial allergic rhinitis    [place]   . Seasonal allergic rhinitis    [place]   Past Surgical History:  Procedure Laterality Date  . ADENOIDECTOMY     only adenoids still have tonsils  . BREAST LUMPECTOMY WITH RADIOACTIVE SEED AND SENTINEL LYMPH NODE BIOPSY Left 05/17/2017   Procedure: LEFT BREAST LUMPECTOMY WITH RADIOACTIVE SEED AND SENTINEL LYMPH NODE BIOPSY;  Surgeon: Griselda Miner, MD;  Location: [facility] OR;  Service: General;  Laterality: Left;  . CESAREAN SECTION     x3  . excision of precancerous mole     lft arm, chest   Social History   Socioeconomic History  . Marital status: Married    Spouse name: Not on file  . Number of children: 3  . Years of education: Not on file  . Highest education level: Not on file  Occupational History  . Not on file  Tobacco Use  . Smoking status: Never  . Smokeless tobacco: Never  Vaping Use  . Vaping status: Never Used  Substance and Sexual Activity  . Alcohol use: Yes    Alcohol/week: 2.0 standard drinks of alcohol     Types: 2 Standard drinks or equivalent per week  . Drug use: No  . Sexual activity: Yes    Partners: Male    Birth control/protection: Other-see comments    Comment: vasectomy  Other Topics Concern  . Not on file  Social History Narrative  . Not on file   Social Drivers of Health   Financial Resource Strain: Not on file  Food Insecurity: Not on file  Transportation Needs: Not on file  Physical Activity: Not on file  Stress: Not on file  Social Connections: Not on file   Family History  Problem Relation Age of Onset  . Mitral valve prolapse Mother   . Alzheimer's disease Mother   . Prostate cancer Father   . Transient ischemic attack Father   . Breast cancer Paternal Aunt 35       bile duct cancer  . Colon cancer Neg Hx   . Rectal cancer Neg Hx   . Stomach cancer Neg Hx   . Esophageal cancer Neg Hx    No Known Allergies Current Outpatient Medications  Medication Sig Dispense Refill  . ALLERGY RELIEF 180 MG tablet TAKE 1 TABLET BY MOUTH AS NEEDED 90 tablet 3  . amLODipine (NORVASC) 5 MG tablet Take 1 tablet (5 mg total) by mouth daily. 90  tablet 3  . anastrozole (ARIMIDEX) 1 MG tablet TAKE 1 TABLET(1 MG) BY MOUTH DAILY 30 tablet 0  . apixaban (ELIQUIS) 5 MG TABS tablet Take 1 tablet (5 mg total) by mouth 2 (two) times daily. 60 tablet 11  . IBUPROFEN PO Take by mouth as needed.    Marland Kitchen losartan-hydrochlorothiazide (HYZAAR) 100-12.5 MG tablet Take 1 tablet by mouth daily. 90 tablet 3  . metoprolol succinate (TOPROL-XL) 50 MG 24 hr tablet Take 1 tablet (50 mg total) by mouth daily. Take with or immediately following a meal. 90 tablet 3  . nitrofurantoin, macrocrystal-monohydrate, (MACROBID) 100 MG capsule Take 1 tab daily prn UTI prophylaxis 30 capsule 1  . rosuvastatin (CRESTOR) 5 MG tablet Take 1 tablet (5 mg total) by mouth daily. 90 tablet 3  . valACYclovir (VALTREX) 1000 MG tablet 2 grams x 1, repeat in 12 hours.  Use with symptom onset. 30 tablet 1   No current  facility-administered medications for this visit.   No results found.  Review of Systems:   A ROS was performed including pertinent positives and negatives as documented in the HPI.  Physical Exam :   Constitutional: NAD and appears stated age Neurological: Alert and oriented Psych: Appropriate affect and cooperative Last menstrual period 07/17/2013.   Comprehensive Musculoskeletal Exam:    Tenderness about the right anterior lateral shoulder, greater today, negative speeds maneuver.  Full active forward elevation is to 170 degrees equal bilaterally external Tat[place] the side is 50 degrees bilaterally.  Internal rotation is to L1 bilaterally, negative belly press, intact neurovascular exam   Imaging:   Xray (3 views right shoulder): Normal     I personally reviewed and interpreted the radiographs.   Assessment and Plan:   63 y.o. female with right shoulder pain consistent with subacromial bursitis/rotator cuff impingement.  Today I did recommend that we could consider an initial ultrasound-guided injection.  We will plan to begin with this.  She will follow-up should she have recurrence of her pain at which time we would discuss further options including an MRI  -Right shoulder subacromial injection provided after verbal consent obtained    Procedure Note  Patient: Kristina Sanders             Date of Birth: [birth year]           MRN: [id number]             Visit Date: 02/04/2024  Procedures: Visit Diagnoses:  1. Chronic right shoulder pain     Large Joint Inj: R subacromial bursa on 02/04/2024 3:14 PM Indications: pain Details: 22 G 1.5 in needle, ultrasound-guided anterior approach  Arthrogram: No  Medications: 4 mL lidocaine 1 %; 80 mg triamcinolone acetonide 40 MG/ML Outcome: tolerated well, no immediate complications Procedure, treatment alternatives, risks and benefits explained, specific risks discussed. Consent was given by the patient. Immediately prior to  procedure a time out was called to verify the correct patient, procedure, equipment, support staff and site/side marked as required. Patient was prepped and draped in the usual sterile fashion.        I personally saw and evaluated the patient, and participated in the management and treatment plan.  Huel Cote, MD Attending Physician, Orthopedic Surgery  This document was dictated using Dragon voice recognition software. A reasonable attempt at proof reading has been made to minimize errors.

## 2024-02-09 ENCOUNTER — Other Ambulatory Visit: Payer: Self-pay

## 2024-02-09 DIAGNOSIS — C50212 Malignant neoplasm of upper-inner quadrant of left female breast: Secondary | ICD-10-CM

## 2024-02-10 ENCOUNTER — Inpatient Hospital Stay (HOSPITAL_BASED_OUTPATIENT_CLINIC_OR_DEPARTMENT_OTHER): Payer: BC Managed Care – PPO | Admitting: Nurse Practitioner

## 2024-02-10 ENCOUNTER — Telehealth: Payer: Self-pay | Admitting: Nurse Practitioner

## 2024-02-10 ENCOUNTER — Encounter: Payer: Self-pay | Admitting: Nurse Practitioner

## 2024-02-10 ENCOUNTER — Inpatient Hospital Stay: Payer: BC Managed Care – PPO | Attending: Hematology

## 2024-02-10 DIAGNOSIS — Z170 Estrogen receptor positive status [ER+]: Secondary | ICD-10-CM | POA: Diagnosis not present

## 2024-02-10 DIAGNOSIS — Z79811 Long term (current) use of aromatase inhibitors: Secondary | ICD-10-CM | POA: Diagnosis not present

## 2024-02-10 DIAGNOSIS — Z1732 Human epidermal growth factor receptor 2 negative status: Secondary | ICD-10-CM | POA: Insufficient documentation

## 2024-02-10 DIAGNOSIS — C50212 Malignant neoplasm of upper-inner quadrant of left female breast: Secondary | ICD-10-CM

## 2024-02-10 DIAGNOSIS — Z1721 Progesterone receptor positive status: Secondary | ICD-10-CM | POA: Insufficient documentation

## 2024-02-10 LAB — CMP (CANCER CENTER ONLY)
ALT: 18 U/L (ref 0–44)
AST: 14 U/L — ABNORMAL LOW (ref 15–41)
Albumin: 4.5 g/dL (ref 3.5–5.0)
Alkaline Phosphatase: 61 U/L (ref 38–126)
Anion gap: 9 (ref 5–15)
BUN: 20 mg/dL (ref 8–23)
CO2: 27 mmol/L (ref 22–32)
Calcium: 9.8 mg/dL (ref 8.9–10.3)
Chloride: 100 mmol/L (ref 98–111)
Creatinine: 0.87 mg/dL (ref 0.44–1.00)
GFR, Estimated: >60 mL/min (ref >60)
Glucose, Bld: 156 mg/dL — ABNORMAL HIGH (ref 70–99)
Potassium: 3.8 mmol/L (ref 3.5–5.1)
Sodium: 136 mmol/L (ref 135–145)
Total Bilirubin: 0.7 mg/dL (ref 0.0–1.2)
Total Protein: 7.1 g/dL (ref 6.5–8.1)

## 2024-02-10 LAB — CBC WITH DIFFERENTIAL (CANCER CENTER ONLY)
Abs Immature Granulocytes: 0.07 10*3/uL (ref 0.00–0.07)
Basophils Absolute: 0.1 10*3/uL (ref 0.0–0.1)
Basophils Relative: 1 %
Eosinophils Absolute: 0.1 10*3/uL (ref 0.0–0.5)
Eosinophils Relative: 1 %
HCT: 38 % (ref 36.0–46.0)
Hemoglobin: 13.3 g/dL (ref 12.0–15.0)
Immature Granulocytes: 1 %
Lymphocytes Relative: 21 %
Lymphs Abs: 2.5 10*3/uL (ref 0.7–4.0)
MCH: 33.8 pg (ref 26.0–34.0)
MCHC: 35 g/dL (ref 30.0–36.0)
MCV: 96.4 fL (ref 80.0–100.0)
Monocytes Absolute: 0.6 10*3/uL (ref 0.1–1.0)
Monocytes Relative: 5 %
Neutro Abs: 8.6 10*3/uL — ABNORMAL HIGH (ref 1.7–7.7)
Neutrophils Relative %: 71 %
Platelet Count: 289 10*3/uL (ref 150–400)
RBC: 3.94 MIL/uL (ref 3.87–5.11)
RDW: 11.8 % (ref 11.5–15.5)
WBC Count: 11.9 10*3/uL — ABNORMAL HIGH (ref 4.0–10.5)
nRBC: 0 % (ref 0.0–0.2)

## 2024-02-10 MED ORDER — ANASTROZOLE 1 MG PO TABS: ORAL_TABLET | ORAL | 1 refills | Status: DC

## 2024-02-10 NOTE — Progress Notes (Signed)
 Patient Care Team: Alyson Reedy, FNP as PCP - General (Family Medicine) Jodelle Red, MD as PCP - Cardiology (Cardiology) Jerene Bears, MD as Consulting Physician (Gynecology) Irena Cords, Enzo Montgomery, MD (Allergy and Immunology) Malachy Mood, MD as Consulting Physician (Hematology) Griselda Miner, MD as Consulting Physician (General Surgery) Lonie Peak, MD as Attending Physician (Radiation Oncology) Loa Socks, NP as Nurse Practitioner (Hematology and Oncology) Jodelle Red, MD as Consulting Physician (Cardiology)  Clinic Day:  02/10/2024  Referring physician: Tollie Eth, NP  ASSESSMENT & PLAN:   Assessment & Plan: Malignant neoplasm of upper-inner quadrant of left breast in female, estrogen receptor positive (HCC) pT1cN0M0, stage IA, ER+/PR+/HER2-,  Grade 2 -diagnosed 04/2017, s/p lumpectomy, adjuvant radiation, and adjuvant AI with anastrozole. Oncotype recurrence score is 0, no benefit of chemotherapy for low risk group.  -She began adjuvant antiestrogen therapy with anastrozole in 06/2017, tolerating well. Plan to continue for total 5-7 years. -mammogram 05/2022 benign; at [facility] -Ms. Duggin is clinically doing well.  Tolerating anastrozole without significant side effects.  Breast exam is benign, labs are unremarkable.  Overall no clinical concern for breast cancer recurrence -Continue breast cancer surveillance and anastrozole (refilled) which she will complete 06/2024  -05/28/2023 screening mammogram done with negative results. She started having left breast pain which she reported was similar to that of scar tissue she had after surgery and radiation.  She also reports running out of anastrozole just prior to the onset of "new" breast pain.  She was asked to restart anastrozole and a new prescription was sent to her pharmacy. She states that the breast tenderness has improved some, but has not completely resolved. --diagnostic mammogram and  ultrasound ordered at [facility] for further evaluation.    Plan:  Labs reviewed.  Essentially normal. Reviewed most recent screening mammogram which was negative. Due to recent inferior left breast pain, will get diagnostic mammogram and ultrasound of the left breast for further evaluation.  Will follow-up with patient as soon as results are available. Recommend she stay on anastrozole daily.  Refill sent to her pharmacy. Will plan to see patient back in 1 year with labs and routine follow-up.  Will follow-up sooner as needed based on mammogram and ultrasound results.  The patient understands the plans discussed today and is in agreement with them.  She knows to contact our office if she develops concerns prior to her next appointment.  I provided 30 minutes of face-to-face time during this encounter and > 50% was spent counseling as documented under my assessment and plan.    Carlean Jews, NP  [facility] - A DEPT OF [facility] [address] [place] [address] Dept: [phone] Dept Fax: [phone]   Orders Placed This Encounter  Procedures   MM Digital Diagnostic Unilat L    Standing Status:   Future    Expected Date:   02/11/2024    Expiration Date:   02/09/2025    Reason for Exam (SYMPTOM  OR DIAGNOSIS REQUIRED):   new left breast tenderness. history of left breast cancer    Preferred imaging location?:   External             patient goes to [facility]   [place] LIMITED ULTRASOUND INCLUDING AXILLA LEFT BREAST     Patient goes to [facility]    Standing Status:   Future    Expected Date:   02/11/2024  Expiration Date:   02/09/2025    Reason for Exam (SYMPTOM  OR DIAGNOSIS REQUIRED):   new left breast tenderness. history of left breast cancer    Preferred Imaging Location?:   External      CHIEF COMPLAINT:  CC: Left breast cancer, estrogen receptor positive  Current Treatment:  Anastrozole 1 mg, started 07/2027. Goal of 5-7 years   INTERVAL HISTORY:  Conny is here today for repeat clinical assessment. She was last seen 1 year ago by Clayborn Heron, NP.  She had mammogram on 05/28/2023 with negative results. She started having left breast pain which she reported was similar to that of scar tissue she had after surgery and radiation.  She also reports running out of anastrozole just prior to the onset of "new" breast pain.  She was asked to restart anastrozole and a new prescription was sent to her pharmacy. She states that the breast tenderness has improved some, but has not completely resolved. Besides the discontinuation of anastrozole, she is unable to pinpoint anything specific to cause the discomfort. She states that she has started wearing a bra all the time, even to bed.  She denies presence of new lump or mass. She denies redness or warmth to the tender part of the left breast. She has noted no concerns with the nipple. She denies fevers or chills. Her appetite is good. Her weight has been stable.  I have reviewed the past medical history, past surgical history, social history and family history with the patient and they are unchanged from previous note.  ALLERGIES:  has no known allergies.  MEDICATIONS:  Current Outpatient Medications  Medication Sig Dispense Refill   ALLERGY RELIEF 180 MG tablet TAKE 1 TABLET BY MOUTH AS NEEDED 90 tablet 3   amLODipine (NORVASC) 5 MG tablet Take 1 tablet (5 mg total) by mouth daily. 90 tablet 3   apixaban (ELIQUIS) 5 MG TABS tablet Take 1 tablet (5 mg total) by mouth 2 (two) times daily. 60 tablet 11   IBUPROFEN PO Take by mouth as needed.     losartan-hydrochlorothiazide (HYZAAR) 100-12.5 MG tablet Take 1 tablet by mouth daily. 90 tablet 3   nitrofurantoin, macrocrystal-monohydrate, (MACROBID) 100 MG capsule Take 1 tab daily prn UTI prophylaxis 30 capsule 1   valACYclovir (VALTREX) 1000 MG tablet 2 grams x 1, repeat in 12 hours.  Use with  symptom onset. 30 tablet 1   anastrozole (ARIMIDEX) 1 MG tablet TAKE 1 TABLET(1 MG) BY MOUTH DAILY 90 tablet 1   metoprolol succinate (TOPROL-XL) 50 MG 24 hr tablet Take 1 tablet (50 mg total) by mouth daily. Take with or immediately following a meal. 90 tablet 3   No current facility-administered medications for this visit.    HISTORY OF PRESENT ILLNESS:   Oncology History Overview Note  Cancer Staging Malignant neoplasm of upper-inner quadrant of left breast in female, estrogen receptor positive (HCC) Staging form: Breast, AJCC 8th Edition - Clinical stage from 04/19/2017: Stage IA (cT1b, cN0, cM0, G2, ER: Positive, PR: Positive, HER2: Negative) - Signed by Malachy Mood, MD on 04/28/2017 - Pathologic stage from 05/17/2017: Stage IA (pT1c, pN0, cM0, G2, ER: Positive, PR: Positive, HER2: Negative) - Signed by Malachy Mood, MD on 07/24/2017     Malignant neoplasm of upper-inner quadrant of left breast in female, estrogen receptor positive (HCC)  04/15/2017 Mammogram   Diagnostic mammogram and ultrasound of left breast:  The new 1.2 cm oval solid mass in the left breast 11:00 position, posterior depth, is  highly suspicious for malignancy. The grouped mucus of calcium calcifications in the left breast at the 1:00 and 10:00 middle depth are benign. Ultrasound of the left axilla was negative for adenopathy.   04/19/2017 Receptors her2   Estrogen Receptor: 100%, POSITIVE, STRONG STAINING INTENSITY Progesterone Receptor: 100%, POSITIVE, STRONG STAINING INTENSITY Proliferation Marker Ki67: 30% HER2 (-)   04/19/2017 Initial Biopsy   Breast, left, needle core biopsy INVASIVE DUCTAL CARCINOMA, GRADE 1 DUCTAL CARCINOMA IN SITU, GRADE 1   04/22/2017 Initial Diagnosis   Malignant neoplasm of upper-inner quadrant of left breast in female, estrogen receptor positive (HCC)   05/17/2017 Surgery   LEFT BREAST LUMPECTOMY WITH RADIOACTIVE SEED AND SENTINEL LYMPH NODE BIOPSY by Dr. Carolynne Edouard   05/17/2017 Pathology Results    Diagnosis 05/17/17 1. 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 Lymph node, sentinel, biopsy, Left axillary ONE LYMPH NODE, NEGATIVE FOR CARCINOMA (0/1) 3. Lymph node, sentinel, biopsy, Left axillary ONE LYMPH NODE, NEGATIVE FOR CARCINOMA (0/1) 4. Breast, excision, Left new anterior margin SMALL FOCUS OF DUCTAL CARCINOMA IN SITU THE CARCINOMA IS 1 MM FROM THE ANTERIOR INKED MARGIN 5. Breast, excision, Left new medial margin BENIGN BREAST TISSUE NEGATIVE FOR CARCINOMA 6. Breast, excision, Left new superior margin FIBROCYSTIC CHANGES WITH CALCIFICATIONS NEGATIVE FOR CARCINOMA 7. Breast, excision, Left new lateral margin FIBROCYSTIC CHANGES WITH CALCIFICATIONS USUAL DUCTAL HYPERPLASIA NEGATIVE FOR CARCINOMA     05/17/2017 Miscellaneous   Oncotype   Recurrence score: 0 Low Risk 10-year risk of recurrence of 3% with Tamoxifen alone   06/24/2017 - 07/23/2017 Radiation Therapy   Radiation with Dr. Basilio Cairo   07/2017 -  Anti-estrogen oral therapy   Anastrozole 1 mg daily     04/21/2018 Mammogram   04/21/2018 Mammogram Benign       REVIEW OF SYSTEMS:   Constitutional: Denies fevers, chills or abnormal weight loss Eyes: Denies blurriness of vision Ears, nose, mouth, throat, and face: Denies mucositis or sore throat Respiratory: Denies cough, dyspnea or wheezes Cardiovascular: Denies palpitation, chest discomfort or lower extremity swelling Gastrointestinal:  Denies nausea, heartburn or change in bowel habits Skin: Denies abnormal skin rashes Lymphatics: Denies new lymphadenopathy or easy bruising Neurological:Denies numbness, tingling or new weaknesses Behavioral/Psych: Mood is stable, no new changes  Breast -left breast pain, mostly along the inferior aspect of the  breast.  She denies lump or mass.  Denies warmth or redness.  Reports some improvement since restarting anastrozole. All other systems were reviewed with the patient and are negative.   VITALS:   Today's Vitals   02/10/24 1058 02/10/24 1101  BP: (!) 156/72 (!) 150/78  Pulse: 77   Resp: 19   Temp: 98 F (36.7 C)   TempSrc: Temporal   SpO2: 99%   Weight: 185 lb 14.4 oz (84.3 kg)   Height: 5' 5.5" (1.664 m)   PainSc:  0-No pain   Body mass index is 30.46 kg/m.   Wt Readings from Last 3 Encounters:  02/10/24 185 lb 14.4 oz (84.3 kg)  11/01/23 184 lb 6.4 oz (83.6 kg)  10/07/23 184 lb 9.6 oz (83.7 kg)    Body mass index is 30.46 kg/m.  Performance status (ECOG): 1 - Symptomatic but completely ambulatory  PHYSICAL EXAM:   GENERAL:alert, no distress and comfortable SKIN: skin color, texture, turgor  are normal, no rashes or significant lesions EYES: normal, Conjunctiva are pink and non-injected, sclera clear OROPHARYNX:no exudate, no erythema and lips, buccal mucosa, and tongue normal  NECK: supple, thyroid normal size, non-tender, without nodularity LYMPH:  no palpable lymphadenopathy in the cervical, axillary or inguinal LUNGS: clear to auscultation and percussion with normal breathing effort HEART: regular rate & rhythm and no murmurs and no lower extremity edema ABDOMEN:abdomen soft, non-tender and normal bowel sounds Musculoskeletal:no cyanosis of digits and no clubbing  NEURO: alert & oriented x 3 with fluent speech, no focal motor/sensory deficits BREAST: Left breast with well-healed left axillary surgical scar and left breast lumpectomy scars.  No palpable mass or lump.  No redness or warmth noted on the left breast.  There is tenderness present with palpation of inferior aspect of the left breast from approximately 4:00 to 8:00 region of the breast.  There is no nipple inversion or nipple discharge.  There is no axillary lymphadenopathy on the left. The right breast is  without lump or mass. There is no nipple inversion or nipple discharge. There is no axillary lymphadenopathy on the right.   LABORATORY DATA:  I have reviewed the data as listed    Component Value Date/Time   NA 136 02/10/2024 1040   NA 132 (L) 09/23/2023 0848   NA 140 04/28/2017 0841   K 3.8 02/10/2024 1040   K 3.7 04/28/2017 0841   CL 100 02/10/2024 1040   CO2 27 02/10/2024 1040   CO2 27 04/28/2017 0841   GLUCOSE 156 (H) 02/10/2024 1040   GLUCOSE 109 04/28/2017 0841   BUN 20 02/10/2024 1040   BUN 12 09/23/2023 0848   BUN 14.7 04/28/2017 0841   CREATININE 0.87 02/10/2024 1040   CREATININE 0.7 04/28/2017 0841   CALCIUM 9.8 02/10/2024 1040   CALCIUM 9.7 04/28/2017 0841   PROT 7.1 02/10/2024 1040   PROT 6.8 09/23/2023 0848   PROT 7.0 04/28/2017 0841   ALBUMIN 4.5 02/10/2024 1040   ALBUMIN 4.4 09/23/2023 0848   ALBUMIN 4.2 04/28/2017 0841   AST 14 (L) 02/10/2024 1040   AST 21 04/28/2017 0841   ALT 18 02/10/2024 1040   ALT 21 04/28/2017 0841   ALKPHOS 61 02/10/2024 1040   ALKPHOS 68 04/28/2017 0841   BILITOT 0.7 02/10/2024 1040   BILITOT 0.58 04/28/2017 0841   GFRNONAA >60 02/10/2024 1040   GFRAA 118 08/16/2020 1013    Lab Results  Component Value Date   WBC 11.9 (H) 02/10/2024   NEUTROABS 8.6 (H) 02/10/2024   HGB 13.3 02/10/2024   HCT 38.0 02/10/2024   MCV 96.4 02/10/2024   PLT 289 02/10/2024

## 2024-02-10 NOTE — Assessment & Plan Note (Signed)
 pT1cN0M0, stage IA, ER+/PR+/HER2-,  Grade 2 -diagnosed 04/2017, s/p lumpectomy, adjuvant radiation, and adjuvant AI with anastrozole. Oncotype recurrence score is 0, no benefit of chemotherapy for low risk group.  -She began adjuvant antiestrogen therapy with anastrozole in 06/2017, tolerating well. Plan to continue for total 5-7 years. -mammogram 05/2022 benign; at [facility] -Kristina Sanders is clinically doing well.  Tolerating anastrozole without significant side effects.  Breast exam is benign, labs are unremarkable.  Overall no clinical concern for breast cancer recurrence -Continue breast cancer surveillance and anastrozole (refilled) which she will complete 06/2024  -05/28/2023 screening mammogram done with negative results. She started having left breast pain which she reported was similar to that of scar tissue she had after surgery and radiation.  She also reports running out of anastrozole just prior to the onset of "new" breast pain.  She was asked to restart anastrozole and a new prescription was sent to her pharmacy. She states that the breast tenderness has improved some, but has not completely resolved. --diagnostic mammogram and ultrasound ordered at [facility] for further evaluation.

## 2024-02-10 NOTE — Telephone Encounter (Signed)
 Patient scheduled appointments. Patient is aware of all appointment details.

## 2024-02-14 ENCOUNTER — Other Ambulatory Visit: Payer: Self-pay

## 2024-02-14 ENCOUNTER — Other Ambulatory Visit: Payer: Self-pay | Admitting: Hematology

## 2024-02-14 DIAGNOSIS — C50212 Malignant neoplasm of upper-inner quadrant of left female breast: Secondary | ICD-10-CM

## 2024-02-22 ENCOUNTER — Encounter: Payer: Self-pay | Admitting: Hematology

## 2024-04-25 ENCOUNTER — Other Ambulatory Visit (HOSPITAL_BASED_OUTPATIENT_CLINIC_OR_DEPARTMENT_OTHER): Payer: Self-pay | Admitting: Cardiology

## 2024-04-25 DIAGNOSIS — I480 Paroxysmal atrial fibrillation: Secondary | ICD-10-CM

## 2024-04-28 ENCOUNTER — Ambulatory Visit (HOSPITAL_BASED_OUTPATIENT_CLINIC_OR_DEPARTMENT_OTHER): Payer: BC Managed Care – PPO | Admitting: Cardiology

## 2024-05-09 ENCOUNTER — Other Ambulatory Visit (HOSPITAL_BASED_OUTPATIENT_CLINIC_OR_DEPARTMENT_OTHER): Payer: Self-pay | Admitting: Cardiology

## 2024-05-09 DIAGNOSIS — I480 Paroxysmal atrial fibrillation: Secondary | ICD-10-CM

## 2024-05-09 DIAGNOSIS — D6869 Other thrombophilia: Secondary | ICD-10-CM

## 2024-05-09 NOTE — Telephone Encounter (Signed)
 Prescription refill request for Eliquis  received. Indication:afib Last office visit:12/24 Scr:0.87  3/25 Age: 63 Weight:84.3  kg  Prescription refilled

## 2024-06-30 ENCOUNTER — Ambulatory Visit (HOSPITAL_BASED_OUTPATIENT_CLINIC_OR_DEPARTMENT_OTHER): Admitting: Cardiology

## 2024-08-02 ENCOUNTER — Encounter (HOSPITAL_BASED_OUTPATIENT_CLINIC_OR_DEPARTMENT_OTHER): Payer: Self-pay | Admitting: Orthopaedic Surgery

## 2024-08-03 ENCOUNTER — Other Ambulatory Visit (HOSPITAL_BASED_OUTPATIENT_CLINIC_OR_DEPARTMENT_OTHER): Payer: Self-pay

## 2024-08-03 ENCOUNTER — Other Ambulatory Visit (HOSPITAL_BASED_OUTPATIENT_CLINIC_OR_DEPARTMENT_OTHER): Payer: Self-pay | Admitting: Orthopaedic Surgery

## 2024-08-03 DIAGNOSIS — G8929 Other chronic pain: Secondary | ICD-10-CM

## 2024-08-03 MED ORDER — METHYLPREDNISOLONE 4 MG PO TBPK
ORAL_TABLET | ORAL | 0 refills | Status: DC
  Filled 2024-08-03: qty 21, 6d supply, fill #0

## 2024-08-03 NOTE — Telephone Encounter (Signed)
 PT placed.

## 2024-08-15 ENCOUNTER — Encounter (HOSPITAL_BASED_OUTPATIENT_CLINIC_OR_DEPARTMENT_OTHER): Payer: Self-pay | Admitting: Obstetrics & Gynecology

## 2024-08-17 ENCOUNTER — Ambulatory Visit (HOSPITAL_BASED_OUTPATIENT_CLINIC_OR_DEPARTMENT_OTHER): Admitting: Cardiology

## 2024-08-23 ENCOUNTER — Encounter: Payer: Self-pay | Admitting: Nurse Practitioner

## 2024-08-23 ENCOUNTER — Other Ambulatory Visit: Payer: Self-pay

## 2024-08-23 DIAGNOSIS — C50212 Malignant neoplasm of upper-inner quadrant of left female breast: Secondary | ICD-10-CM

## 2024-08-23 MED ORDER — ANASTROZOLE 1 MG PO TABS: ORAL_TABLET | ORAL | 1 refills | Status: AC

## 2024-09-11 NOTE — Therapy (Unsigned)
 OUTPATIENT PHYSICAL THERAPY SHOULDER EVALUATION   Patient Name: Kristina Sanders MRN: [id number] DOB:[birth year], 63 y.o., female Today's Date: 09/12/2024  END OF SESSION:  PT End of Session - 09/12/24 1246     Visit Number 1    Number of Visits 4    Date for Recertification  10/17/24    Authorization Type [employer]    PT Start Time 1100    PT Stop Time 1145    PT Time Calculation (min) 45 min    Activity Tolerance Patient tolerated treatment well    Behavior During Therapy WFL for tasks assessed/performed          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   Ovarian cyst    h/o   Perennial allergic rhinitis    [place]    Seasonal allergic rhinitis    [place]   Past Surgical History:  Procedure Laterality Date   ADENOIDECTOMY     only adenoids still have tonsils   BREAST LUMPECTOMY WITH RADIOACTIVE SEED AND SENTINEL LYMPH NODE BIOPSY Left 05/17/2017   Procedure: LEFT BREAST LUMPECTOMY WITH RADIOACTIVE SEED AND SENTINEL LYMPH NODE BIOPSY;  Surgeon: Curvin Deward MOULD, MD;  Location: [facility] OR;  Service: General;  Laterality: Left;   CESAREAN SECTION     x3   excision of precancerous mole     lft arm, chest   Patient Active Problem List   Diagnosis Date Noted   Acute pain of right shoulder 09/23/2023   Wellness examination 09/23/2023   Gastroesophageal reflux disease 09/14/2022   Seasonal allergies 09/14/2022   Irregular heartbeat 09/14/2022   Atrial fibrillation (HCC) 09/14/2022   Retrocalcaneal bursitis 06/11/2020   Achilles tendinitis, right leg 05/16/2020   Prediabetes 06/01/2018   Malignant neoplasm of upper-inner quadrant of left breast in female, 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 She also noted pain when pushing her suitcase in the overhead. She has since been babying her R shoulder due to pain with pushing, pulling and OH movements. Pt states that she would like to stay away from MRI and surgery if possible.   Hand dominance: Right  PERTINENT HISTORY: GERD, HTN.  PAIN:  Are you having pain? Yes: NPRS scale: 2/10 with rest 4/10 with movement  Pain location: Anterior shoulder pain Pain description: Achy pain Aggravating factors: Lifting, pushing, pulling. Relieving factors: Rest, ice  PRECAUTIONS: None  RED FLAGS: None   WEIGHT BEARING RESTRICTIONS: No  FALLS:  Has patient fallen in last 6 months? No  LIVING ENVIRONMENT: Lives with: lives with their family Lives in: House/apartment  PLOF: Independent  PATIENT GOALS:Get stronger and stay away from surgery if possible.   NEXT MD VISIT:   OBJECTIVE:  Note: Objective measures were completed at Evaluation unless otherwise noted.  DIAGNOSTIC FINDINGS:  None  PATIENT SURVEYS: DASH: 23  COGNITION: Overall cognitive status: Within functional limits for tasks assessed     SENSATION: WFL  POSTURE: Rounded shoulder posture.   UPPER EXTREMITY ROM:   Active ROM Right eval  Left eval  Shoulder flexion [facility]  Shoulder extension    Shoulder abduction    Shoulder adduction    Shoulder internal rotation [facility]  Shoulder external rotation WFL Harder to do, not as painful as flexion WFL  (Blank rows = not tested)  UPPER EXTREMITY MMT:  MMT Right eval Left eval  Shoulder flexion 4- 4+  Shoulder extension    Shoulder abduction 4- 4+  Shoulder adduction    Shoulder internal rotation 4 4+  Shoulder external rotation 3+ 4+  Middle trapezius    Lower trapezius    (Blank rows = not tested)  SHOULDER SPECIAL TESTS: Impingement tests: Neer impingement test: positive  and Hawkins/Kennedy impingement test: positive  Rotator cuff assessment: Drop arm test: positive  and Empty can test: positive    JOINT MOBILITY TESTING:  Pt with superior and anterior translated GHJ   PALPATION:  Tenderness at supraspinatus, and biceps tendon.                                                                                                                              TREATMENT DATE: Creating, reviewing, and completing below HEP   PATIENT EDUCATION: Education details: Educated pt on anatomy and physiology of current symptoms, DASH, diagnosis, prognosis, HEP,  and POC. Person educated: Patient Education method: Explanation, Demonstration, Tactile cues, Verbal cues, and Handouts Education comprehension: verbalized understanding, returned demonstration, verbal cues required, and tactile cues required  HOME EXERCISE PROGRAM: [id number]  ASSESSMENT:  CLINICAL IMPRESSION: Patient a 63 y.o. y.o. female who was seen today for physical therapy evaluation and treatment for Chronic R shoulder pain and weakness. Patient presents  with pain limited deficits in R shoulder strength, ROM, endurance, activity tolerance, and functional mobility with ADL. Patient is having to modify and restrict ADL as indicated by outcome measure score as well as subjective information and objective  measures which is affecting overall participation. Patient will benefit from skilled physical therapy in order to improve function and reduce impairment.  OBJECTIVE IMPAIRMENTS: decreased activity tolerance, decreased shoulder mobility, decreased ROM, decreased strength, impaired flexibility, impaired UE use, postural dysfunction, and pain.  ACTIVITY LIMITATIONS: reaching, lifting, carry,  cleaning, driving, and or occupation  PERSONAL FACTORS:  also affecting patient's functional outcome.  REHAB POTENTIAL: Good  CLINICAL DECISION MAKING: Stable/uncomplicated  EVALUATION COMPLEXITY: Low    GOALS: Short term PT Goals Target date: 09/26/24 Pt will be I and compliant with HEP. Baseline:  Goal status: New Pt will decrease pain by 25% overall Baseline: Goal status: New  Long term PT goals Target date: POC date  Pt will improve Rt shoulder AROM to [facility] to improve functional reaching Baseline: Goal status: New Pt will improve  Rt shoulder strength to at least 4+/5 MMT to improve functional strength Baseline: Goal status: New Pt will improve quick Dash by at least 14 points to functional to show improved function Baseline: Goal status: New Pt will reduce pain to overall less than 3/10 with usual activity and work activity. Baseline: Goal status: New  PLAN: PT FREQUENCY: 1x per week  PT DURATION: 4 weeks  PLANNED INTERVENTIONS (unless contraindicated): aquatic PT, Canalith repositioning, cryotherapy, Electrical stimulation, Iontophoresis with 4 mg/ml dexamethasome, Moist heat, traction, Ultrasound, gait training, Therapeutic exercise, balance training, neuromuscular re-education, patient/family education, prosthetic training, manual techniques, passive ROM, dry needling, taping, vasopnuematic device, vestibular, spinal manipulations, joint manipulations  PLAN FOR NEXT SESSION: review/advance HEP, periscapular strengthening.   Rojean JONELLE Batten, PT 09/12/2024, 12:48 PM

## 2024-09-12 ENCOUNTER — Ambulatory Visit (HOSPITAL_BASED_OUTPATIENT_CLINIC_OR_DEPARTMENT_OTHER): Attending: Orthopaedic Surgery | Admitting: Physical Therapy

## 2024-09-12 ENCOUNTER — Other Ambulatory Visit: Payer: Self-pay

## 2024-09-12 ENCOUNTER — Encounter (HOSPITAL_BASED_OUTPATIENT_CLINIC_OR_DEPARTMENT_OTHER): Payer: Self-pay | Admitting: Physical Therapy

## 2024-09-12 DIAGNOSIS — M6281 Muscle weakness (generalized): Secondary | ICD-10-CM | POA: Insufficient documentation

## 2024-09-12 DIAGNOSIS — G8929 Other chronic pain: Secondary | ICD-10-CM | POA: Insufficient documentation

## 2024-09-12 DIAGNOSIS — R293 Abnormal posture: Secondary | ICD-10-CM | POA: Diagnosis present

## 2024-09-12 DIAGNOSIS — M25511 Pain in right shoulder: Secondary | ICD-10-CM | POA: Insufficient documentation

## 2024-09-18 ENCOUNTER — Encounter: Payer: Self-pay | Admitting: Radiology

## 2024-09-19 ENCOUNTER — Encounter (HOSPITAL_BASED_OUTPATIENT_CLINIC_OR_DEPARTMENT_OTHER): Admitting: Physical Therapy

## 2024-09-25 ENCOUNTER — Ambulatory Visit (INDEPENDENT_AMBULATORY_CARE_PROVIDER_SITE_OTHER): Payer: BC Managed Care – PPO | Admitting: Family Medicine

## 2024-09-25 ENCOUNTER — Encounter (HOSPITAL_BASED_OUTPATIENT_CLINIC_OR_DEPARTMENT_OTHER): Payer: Self-pay | Admitting: Family Medicine

## 2024-09-25 VITALS — BP 137/66 | HR 72 | Ht 65.5 in | Wt 185.0 lb

## 2024-09-25 DIAGNOSIS — I10 Essential (primary) hypertension: Secondary | ICD-10-CM | POA: Diagnosis not present

## 2024-09-25 DIAGNOSIS — C50212 Malignant neoplasm of upper-inner quadrant of left female breast: Secondary | ICD-10-CM

## 2024-09-25 DIAGNOSIS — R7303 Prediabetes: Secondary | ICD-10-CM

## 2024-09-25 DIAGNOSIS — Z170 Estrogen receptor positive status [ER+]: Secondary | ICD-10-CM

## 2024-09-25 DIAGNOSIS — Z0000 Encounter for general adult medical examination without abnormal findings: Secondary | ICD-10-CM | POA: Diagnosis not present

## 2024-09-25 DIAGNOSIS — I480 Paroxysmal atrial fibrillation: Secondary | ICD-10-CM

## 2024-09-25 DIAGNOSIS — Z13220 Encounter for screening for lipoid disorders: Secondary | ICD-10-CM

## 2024-09-25 MED ORDER — AMLODIPINE BESYLATE 5 MG PO TABS: 5.0000 mg | ORAL_TABLET | Freq: Every day | ORAL | 3 refills | Status: AC

## 2024-09-25 MED ORDER — METOPROLOL SUCCINATE ER 50 MG PO TB24
50.0000 mg | ORAL_TABLET | Freq: Every day | ORAL | 3 refills | Status: AC
Start: 2024-09-25 — End: ?

## 2024-09-25 MED ORDER — LOSARTAN POTASSIUM-HCTZ 100-12.5 MG PO TABS: 1.0000 | ORAL_TABLET | Freq: Every day | ORAL | 3 refills | Status: AC

## 2024-09-25 NOTE — Progress Notes (Signed)
 Subjective:   Kristina Sanders [birth year]  09/25/2024   CC: Chief Complaint  Patient presents with   Annual Exam    Pt is here today for her physical. Denies any concerns for today's visit.    HPI: Kristina Sanders is a 63 y.o. female who presents for a routine health maintenance exam.  Labs collected at time of visit.   ATRIAL FIBRILLATION: Kristina Sanders presents for the medical management of atrial fibrillation.  Is  followed by cardiology with Dr. Lonni Atrial fibrillation status: controlled Ventricular rate control: Metoprolol   Anti-coagulation: Eliquis  5mg  BID Medication compliance: excellent compliance Denies CP, palpitations, SHOB, syncope, edema, extreme fatigue.   She has appt on 12/8 with cardiology.    HISTORY OF BREAST CANCER:  Patient has hx of malignant neoplasm to left breast estrogen receptor positive and is currently managed by [facility]. Diagnosed in 2018. She began adjuvant antiestrogen therapy with anastrazole in 06/2017 and plan to continue for 5-7 years. She is UTD with mammograms. Due for bone density.    HEALTH SCREENINGS: - Vision Screening: up to date - Dental Visits: up to date - Pap smear: up to date - Breast Exam: up to date - STD Screening: Declined - Mammogram (40+): Up to date  - Colonoscopy (45+): Up to date  - Bone Density (65+ or under 65 with predisposing conditions): Will call [facility] to schedule   - Lung CA screening with low-dose CT:  Not applicable Adults age 61-80 who are current cigarette smokers or quit within the last 15 years. Must have 20 pack year history.   Depression and Anxiety Screen done today and results listed below:     09/25/2024    8:14 AM 10/07/2023    2:19 PM 09/17/2022    3:28 PM 09/11/2021    3:02 PM 09/11/2021    9:26 AM  Depression screen PHQ 2/9  Decreased Interest 0 0 0 0 0  Down, Depressed, Hopeless 0 0 1 0 0  PHQ - 2 Score 0 0 1 0 0  Altered sleeping 0      Tired, decreased energy  0      Change in appetite 0      Feeling bad or failure about yourself  0      Trouble concentrating 0      Moving slowly or fidgety/restless 0      Suicidal thoughts 0      PHQ-9 Score 0      Difficult doing work/chores Not difficult at all          09/25/2024    8:14 AM  GAD 7 : Generalized Anxiety Score  Nervous, Anxious, on Edge 0  Control/stop worrying 0  Worry too much - different things 0  Trouble relaxing 0  Restless 0  Easily annoyed or irritable 0  Afraid - awful might happen 0  Total GAD 7 Score 0  Anxiety Difficulty Not difficult at all    IMMUNIZATIONS: - Tdap: Tetanus vaccination status reviewed: last tetanus booster within 10 years. - HPV: Not applicable - Influenza: Up to date - Prevnar 20: Recommended - Shingrix (50+): Up to date   Past medical history, surgical history, medications, allergies, family history and social history reviewed with patient today and changes made to appropriate areas of the chart.   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   Ovarian cyst    h/o   Perennial allergic rhinitis    [place]    Seasonal allergic rhinitis    [place]    Past Surgical History:  Procedure Laterality Date   ADENOIDECTOMY     only adenoids still have tonsils   BREAST LUMPECTOMY WITH RADIOACTIVE SEED AND SENTINEL LYMPH NODE BIOPSY Left 05/17/2017   Procedure: LEFT BREAST LUMPECTOMY WITH RADIOACTIVE SEED AND SENTINEL LYMPH NODE BIOPSY;  Surgeon: Curvin Deward MOULD, MD;  Location: [facility] OR;  Service: General;  Laterality: Left;   BREAST SURGERY  July 2018   CESAREAN SECTION     x3   excision of precancerous mole     lft arm, chest    Current Outpatient Medications on File Prior to Visit   Medication Sig   ALLERGY RELIEF 180 MG tablet TAKE 1 TABLET BY MOUTH AS NEEDED   anastrozole  (ARIMIDEX ) 1 MG tablet TAKE 1 TABLET(1 MG) BY MOUTH DAILY   ELIQUIS  5 MG TABS tablet TAKE 1 TABLET(5 MG) BY MOUTH TWICE DAILY   IBUPROFEN PO Take by mouth as needed.   valACYclovir  (VALTREX ) 1000 MG tablet 2 grams x 1, repeat in 12 hours.  Use with symptom onset.   No current facility-administered medications on file prior to visit.    No Known Allergies   Social History   Socioeconomic History   Marital status: Married    Spouse name: Not on file   Number of children: 3   Years of education: Not on file   Highest education level: Not on file  Occupational History   Not on file  Tobacco Use   Smoking status: Never   Smokeless tobacco: Never  Vaping Use   Vaping status: Never Used  Substance and Sexual Activity   Alcohol use: Yes    Alcohol/week: 2.0 standard drinks of alcohol    Types: 2 Standard drinks or equivalent per week   Drug use: No   Sexual activity: Yes    Partners: Male    Birth control/protection: Other-see comments    Comment: vasectomy  Other Topics Concern   Not on file  Social History Narrative   Not on file   Social Drivers of Health   Financial Resource Strain: Not on file  Food Insecurity: Not on file  Transportation Needs: Not on file  Physical Activity: Not on file  Stress: Not on file  Social Connections: Not on file  Intimate Partner Violence: Not on file   Social History   Tobacco Use  Smoking Status Never  Smokeless Tobacco Never   Social History   Substance and Sexual Activity  Alcohol Use Yes   Alcohol/week: 2.0 standard drinks of alcohol   Types: 2 Standard drinks or equivalent per week    Family History  Problem Relation Age of Onset   Mitral valve prolapse Mother    Alzheimer's disease Mother    Prostate cancer Father    Transient ischemic attack Father    Stroke Father    Breast cancer Paternal Aunt 30       bile duct  cancer   Colon cancer Neg Hx    Rectal cancer Neg Hx    Stomach cancer Neg Hx    Esophageal cancer Neg Hx      ROS: Denies fever,  fatigue, unexplained weight loss/gain, chest pain, SHOB, and palpitations. Denies neurological deficits, gastrointestinal or genitourinary complaints, and skin changes.   Objective:   Today's Vitals   09/25/24 0809  BP: 137/66  Pulse: 72  SpO2: 100%  Weight: 185 lb (83.9 kg)  Height: 5' 5.5 (1.664 m)    GENERAL APPEARANCE: Well-appearing, in NAD. Well nourished.  SKIN: Pink, warm and dry. Turgor normal. No rash, lesion, ulceration, or ecchymoses. Hair evenly distributed.  HEENT: HEAD: Normocephalic.  EYES: PERRLA. EOMI. Lids intact w/o defect. Sclera white, Conjunctiva pink w/o exudate.  EARS: External ear w/o redness, swelling, masses or lesions. EAC clear. TM's intact, translucent w/o bulging, appropriate landmarks visualized. Appropriate acuity to conversational tones.  NOSE: Septum midline w/o deformity. Nares patent, mucosa pink and non-inflamed w/o drainage. No sinus tenderness.  THROAT: Uvula midline. Oropharynx clear. Tonsils non-inflamed w/o exudate. Oral mucosa pink and moist.  NECK: Supple, Trachea midline. Full ROM w/o pain or tenderness. No lymphadenopathy. Thyroid non-tender w/o enlargement or palpable masses.  RESPIRATORY: Chest wall symmetrical w/o masses. Respirations even and non-labored. Breath sounds clear to auscultation bilaterally. No wheezes, rales, rhonchi, or crackles. CARDIAC: S1, S2 present, regular rate and rhythm. No gallops, murmurs, rubs, or clicks. PMI w/o lifts, heaves, or thrills. No carotid bruits. Capillary refill <2 seconds. Peripheral pulses 2+ bilaterally. GI: Abdomen soft w/o distention. Normoactive bowel sounds. No palpable masses or tenderness. No guarding or rebound tenderness. Liver and spleen w/o tenderness or enlargement. No CVA tenderness.  MSK: Muscle tone and strength appropriate for age, w/o atrophy or  abnormal movement.  EXTREMITIES: Active ROM intact, w/o tenderness, crepitus, or contracture. No obvious joint deformities or effusions. No clubbing, edema, or cyanosis.  NEUROLOGIC: CN's II-XII intact. Motor strength symmetrical with no obvious weakness. No sensory deficits. DTR's 2+ symmetric bilaterally. Steady, even gait.  PSYCH/MENTAL STATUS: Alert, oriented x 3. Cooperative, appropriate mood and affect.      Assessment & Plan:  1. Annual physical exam (Primary) Discussed preventative screenings, vaccines, and healthy lifestyle with patient. Fasting labs completed today. Recommended Prevnar 20 for prevention.  - CBC with Differential/Platelet - Comprehensive metabolic panel with GFR - Lipid panel - TSH  2. Essential hypertension Stable. Continue current regimen.  - amLODipine  (NORVASC ) 5 MG tablet; Take 1 tablet (5 mg total) by mouth daily.  Dispense: 90 tablet; Refill: 3 - losartan -hydrochlorothiazide (HYZAAR) 100-12.5 MG tablet; Take 1 tablet by mouth daily.  Dispense: 90 tablet; Refill: 3  3. Paroxysmal atrial fibrillation (HCC) Stable. Continue current regimen and management by Cardiology.  - metoprolol  succinate (TOPROL -XL) 50 MG 24 hr tablet; Take 1 tablet (50 mg total) by mouth daily. Take with or immediately following a meal.  Dispense: 90 tablet; Refill: 3  4. Prediabetes Managed previously with diet and exercise. Will check A1C with labs today.  - Hemoglobin A1c  5. Screening for lipid disorders - Lipid panel  6. Malignant neoplasm of upper-inner quadrant of left breast in female, estrogen receptor positive (HCC) Currently stable per chart review. Will continue yearly surveillance with Oncology. Lipids, CMP collected as part of surveillance today. Recommend she complete Bone Density exam this year.    Orders Placed This Encounter  Procedures   CBC with Differential/Platelet   Comprehensive metabolic panel with GFR   Lipid panel   TSH   Hemoglobin A1c     PATIENT COUNSELING:  - Encouraged a healthy well-balanced diet. Patient may adjust caloric intake to maintain or achieve ideal body weight. May reduce intake of dietary  saturated fat and total fat and have adequate dietary potassium and calcium  preferably from fresh fruits, vegetables, and low-fat dairy products.   - Advised to avoid cigarette smoking. - Discussed with the patient that most people either abstain from alcohol or drink within safe limits (<=14/week and <=4 drinks/occasion for males, <=7/weeks and <= 3 drinks/occasion for females) and that the risk for alcohol disorders and other health effects rises proportionally with the number of drinks per week and how often a drinker exceeds daily limits. - Discussed cessation/primary prevention of drug use and availability of treatment for abuse.  - Discussed sexually transmitted diseases, avoidance of unintended pregnancy and contraceptive alternatives.  - Stressed the importance of regular exercise - Injury prevention: Discussed safety belts, safety helmets, smoke detector, smoking near bedding or upholstery.  - Dental health: Discussed importance of regular tooth brushing, flossing, and dental visits.   NEXT PREVENTATIVE PHYSICAL DUE IN 1 YEAR.  Return in about 1 year (around 09/25/2025) for ANNUAL PHYSICAL.  Patient to reach out to office if new, worrisome, or unresolved symptoms arise or if no improvement in patient's condition. Patient verbalized understanding and is agreeable to treatment plan. All questions answered to patient's satisfaction.    Thersia Schuyler Stark, [place]

## 2024-09-25 NOTE — Patient Instructions (Signed)
 Schedule Bone Density Exam with [facility]   Obtain Prevnar 20 vaccine in pharmacy- if insurance covers

## 2024-09-26 LAB — COMPREHENSIVE METABOLIC PANEL WITH GFR
ALT: 30 IU/L (ref 0–32)
AST: 24 IU/L (ref 0–40)
Albumin: 4.5 g/dL (ref 3.9–4.9)
Alkaline Phosphatase: 65 IU/L (ref 49–135)
BUN/Creatinine Ratio: 20 (ref 12–28)
BUN: 15 mg/dL (ref 8–27)
Bilirubin Total: 0.6 mg/dL (ref 0.0–1.2)
CO2: 19 mmol/L — ABNORMAL LOW (ref 20–29)
Calcium: 9.8 mg/dL (ref 8.7–10.3)
Chloride: 96 mmol/L (ref 96–106)
Creatinine, Ser: 0.75 mg/dL (ref 0.57–1.00)
Globulin, Total: 2.2 g/dL (ref 1.5–4.5)
Glucose: 128 mg/dL — ABNORMAL HIGH (ref 70–99)
Potassium: 3.8 mmol/L (ref 3.5–5.2)
Sodium: 134 mmol/L (ref 134–144)
Total Protein: 6.7 g/dL (ref 6.0–8.5)
eGFR: 89 mL/min/1.73 (ref >59)

## 2024-09-26 LAB — CBC WITH DIFFERENTIAL/PLATELET
Basophils Absolute: 0.1 x10E3/uL (ref 0.0–0.2)
Basos: 1 %
EOS (ABSOLUTE): 0.3 x10E3/uL (ref 0.0–0.4)
Eos: 4 %
Hematocrit: 38.7 % (ref 34.0–46.6)
Hemoglobin: 12.7 g/dL (ref 11.1–15.9)
Immature Grans (Abs): 0 x10E3/uL (ref 0.0–0.1)
Immature Granulocytes: 0 %
Lymphocytes Absolute: 1.9 x10E3/uL (ref 0.7–3.1)
Lymphs: 23 %
MCH: 33.6 pg — ABNORMAL HIGH (ref 26.6–33.0)
MCHC: 32.8 g/dL (ref 31.5–35.7)
MCV: 102 fL — ABNORMAL HIGH (ref 79–97)
Monocytes Absolute: 0.6 x10E3/uL (ref 0.1–0.9)
Monocytes: 7 %
Neutrophils Absolute: 5.2 x10E3/uL (ref 1.4–7.0)
Neutrophils: 65 %
Platelets: 286 x10E3/uL (ref 150–450)
RBC: 3.78 x10E6/uL (ref 3.77–5.28)
RDW: 12.6 % (ref 11.7–15.4)
WBC: 8 x10E3/uL (ref 3.4–10.8)

## 2024-09-26 LAB — LIPID PANEL
Chol/HDL Ratio: 4.2 ratio (ref 0.0–4.4)
Cholesterol, Total: 228 mg/dL — ABNORMAL HIGH (ref 100–199)
HDL: 54 mg/dL (ref >39)
LDL Chol Calc (NIH): 144 mg/dL — ABNORMAL HIGH (ref 0–99)
Triglycerides: 166 mg/dL — ABNORMAL HIGH (ref 0–149)
VLDL Cholesterol Cal: 30 mg/dL (ref 5–40)

## 2024-09-26 LAB — TSH: TSH: 4.44 u[IU]/mL (ref 0.450–4.500)

## 2024-09-26 LAB — HEMOGLOBIN A1C
Est. average glucose Bld gHb Est-mCnc: 111 mg/dL
Hgb A1c MFr Bld: 5.5 % (ref 4.8–5.6)

## 2024-09-27 ENCOUNTER — Ambulatory Visit (HOSPITAL_BASED_OUTPATIENT_CLINIC_OR_DEPARTMENT_OTHER): Payer: Self-pay | Admitting: Family Medicine

## 2024-09-27 DIAGNOSIS — E785 Hyperlipidemia, unspecified: Secondary | ICD-10-CM | POA: Insufficient documentation

## 2024-09-27 NOTE — Progress Notes (Signed)
 Hi [place],  Your blood counts are stable. Please increase clear fluid to stay well hydrated. Do you currently take any b12 or folate supplement?  Your electrolytes, kidney and liver function is stable. Your cholesterol has increased from last year especially in the triglyceride (fatty acid) area. This can be due to dietary changes and increase of sugar intake.  The anastrozole  can also increase cholesterol levels. At this time, I would recommend cholesterol medication and/or starting supplement such as Omega 3 Fish oil to reduce the risk of worsening cholesterol and heart disease. If you are agreeable to either, please let em know.   Thyroid function is stable. Your A1C is stable.

## 2024-10-03 ENCOUNTER — Ambulatory Visit (HOSPITAL_BASED_OUTPATIENT_CLINIC_OR_DEPARTMENT_OTHER): Attending: Orthopaedic Surgery | Admitting: Physical Therapy

## 2024-10-03 ENCOUNTER — Encounter (HOSPITAL_BASED_OUTPATIENT_CLINIC_OR_DEPARTMENT_OTHER): Payer: Self-pay | Admitting: Physical Therapy

## 2024-10-03 DIAGNOSIS — M6281 Muscle weakness (generalized): Secondary | ICD-10-CM | POA: Insufficient documentation

## 2024-10-03 DIAGNOSIS — M25511 Pain in right shoulder: Secondary | ICD-10-CM | POA: Insufficient documentation

## 2024-10-03 DIAGNOSIS — R293 Abnormal posture: Secondary | ICD-10-CM | POA: Diagnosis present

## 2024-10-03 NOTE — Therapy (Addendum)
 OUTPATIENT PHYSICAL THERAPY SHOULDER TREATMENT   Patient Name: Kristina Sanders MRN: [id number] DOB:01/01/1961, 63 y.o., female Today's Date: 10/03/2024  END OF SESSION:  PT End of Session - 10/03/24 1303     Visit Number 2    Number of Visits 4    Date for Recertification  10/17/24    Authorization Type [employer]    PT Start Time 1302    PT Stop Time 1342    PT Time Calculation (min) 40 min    Activity Tolerance Patient tolerated treatment well    Behavior During Therapy WFL for tasks assessed/performed          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   Ovarian cyst    h/o   Perennial allergic rhinitis    [place]    Seasonal allergic rhinitis    [place]   Past Surgical History:  Procedure Laterality Date   ADENOIDECTOMY     only adenoids still have tonsils   BREAST LUMPECTOMY WITH RADIOACTIVE SEED AND SENTINEL LYMPH NODE BIOPSY Left 05/17/2017   Procedure: LEFT BREAST LUMPECTOMY WITH RADIOACTIVE SEED AND SENTINEL LYMPH NODE BIOPSY;  Surgeon: Curvin Deward MOULD, MD;  Location: [facility] OR;  Service: General;  Laterality: Left;   BREAST SURGERY  July 2018   CESAREAN SECTION     x3   excision of precancerous mole     lft arm, chest   Patient Active Problem List   Diagnosis Date Noted   Dyslipidemia 09/27/2024   Acute pain of right shoulder 09/23/2023   Wellness examination 09/23/2023   Gastroesophageal reflux disease 09/14/2022   Seasonal allergies 09/14/2022   Irregular heartbeat 09/14/2022   Atrial fibrillation (HCC) 09/14/2022   Retrocalcaneal bursitis 06/11/2020   Achilles tendinitis, right leg 05/16/2020   Prediabetes 06/01/2018   Malignant neoplasm of upper-inner quadrant of left breast  in female, 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   Allergic rhinitis due to other allergen 10/08/2012   Essential hypertension     PCP: Lonni Slain, MD  REFERRING PROVIDER: Genelle Standing, MD  REFERRING DIAG: Chronic right shoulder pain [M25.511, G89.29]   THERAPY DIAG:  Muscle weakness (generalized)  Abnormal posture  Acute pain of right shoulder  Rationale for Evaluation and Treatment: Rehabilitation  ONSET DATE: May 2024  SUBJECTIVE:  SUBJECTIVE STATEMENT: Reports she is doing well today and feels like her exercises have been going well. Feels like her range of motion has gotten better. States she would like us  to give her a good amount of exercises and follow up with another appointment in about a month due to financials.   Hand dominance: Right  PERTINENT HISTORY: GERD, HTN.  PAIN:  Are you having pain? Yes: NPRS scale: 2/10 with rest 4/10 with movement  Pain location: Anterior shoulder pain Pain description: Achy pain Aggravating factors: Lifting, pushing, pulling. Relieving factors: Rest, ice  PRECAUTIONS: None  RED FLAGS: None   WEIGHT BEARING RESTRICTIONS: No  FALLS:  Has patient fallen in last 6 months? No  LIVING ENVIRONMENT: Lives with: lives with their family Lives in: House/apartment  PLOF: Independent  PATIENT GOALS:Get stronger and stay away from surgery if possible.   NEXT MD VISIT:   OBJECTIVE:  Note: Objective measures were completed at Evaluation unless otherwise noted.  DIAGNOSTIC FINDINGS:  None  PATIENT SURVEYS: DASH: 23  COGNITION: Overall cognitive status: Within functional limits for tasks assessed     SENSATION: WFL  POSTURE: Rounded shoulder posture.   UPPER EXTREMITY ROM:   Active ROM Right eval Left eval   Shoulder flexion [facility]  Shoulder extension    Shoulder abduction    Shoulder adduction    Shoulder internal rotation [facility]  Shoulder external rotation WFL Harder to do, not as painful as flexion WFL  (Blank rows = not tested)  UPPER EXTREMITY MMT:  MMT Right eval Left eval  Shoulder flexion 4- 4+  Shoulder extension    Shoulder abduction 4- 4+  Shoulder adduction    Shoulder internal rotation 4 4+  Shoulder external rotation 3+ 4+  Middle trapezius    Lower trapezius    (Blank rows = not tested)  SHOULDER SPECIAL TESTS: Impingement tests: Neer impingement test: positive  and Hawkins/Kennedy impingement test: positive  Rotator cuff assessment: Drop arm test: positive  and Empty can test: positive    JOINT MOBILITY TESTING:  Pt with superior and anterior translated GHJ   PALPATION:  Tenderness at supraspinatus, and biceps tendon.                                                                                                                              TREATMENT DATE: 10/03/24 Prone shoulder flexion x10  Prone shoulder row x10 Prone shoulder extension x10 Horizontal abduction RTB 2x10 D2 shoulder flexion in supine 2x10 YTB UT stretch x30 seconds   Cervical retraction with chin tuck x10 Reviewed and updated HEP  EVAL  Creating, reviewing, and completing below HEP   PATIENT EDUCATION: Education details: Educated pt on anatomy and physiology of current symptoms, DASH, diagnosis, prognosis, HEP,  and POC. Person educated: Patient Education method: Explanation, Demonstration, Tactile cues, Verbal cues, and Handouts Education comprehension: verbalized understanding, returned demonstration, verbal cues required, and tactile cues required  HOME EXERCISE PROGRAM: [id number]  ASSESSMENT:  CLINICAL IMPRESSION:  Tolerated exercises well with no significant increase in pain. Exercises focused on global shoulder strengthening to improve pain and activity tolerance.  Verbal cueing and mirror feedback to decrease guarding in UT. Educated on relevant anatomy and potential soreness tomorrow. Updated HEP with more strengthening exercises. Advised patient to complete HEP with mirror feedback to ensure no guarding through UT. Due to financial considerations, patient will work on HEP and follow up in about 4-6 weeks to assess response. Will continue to benefit from therapy to address remaining limitations.  OBJECTIVE IMPAIRMENTS: decreased activity tolerance, decreased shoulder mobility, decreased ROM, decreased strength, impaired flexibility, impaired UE use, postural dysfunction, and pain.  ACTIVITY LIMITATIONS: reaching, lifting, carry,  cleaning, driving, and or occupation  PERSONAL FACTORS:  also affecting patient's functional outcome.  REHAB POTENTIAL: Good  CLINICAL DECISION MAKING: Stable/uncomplicated  EVALUATION COMPLEXITY: Low    GOALS: Short term PT Goals Target date: 09/26/24 Pt will be I and compliant with HEP. Baseline:  Goal status: New Pt will decrease pain by 25% overall Baseline: Goal status: New  Long term PT goals Target date: POC date  Pt will improve Rt shoulder AROM to [facility] to improve functional reaching Baseline: Goal status: New Pt will improve  Rt shoulder strength to at least 4+/5 MMT to improve functional strength Baseline: Goal status: New Pt will improve quick Dash by at least 14 points to functional to show improved function Baseline: Goal status: New Pt will reduce pain to overall less than 3/10 with usual activity and work activity. Baseline: Goal status: New  PLAN: PT FREQUENCY: 1x per week  PT DURATION: 4 weeks  PLANNED INTERVENTIONS (unless contraindicated): aquatic PT, Canalith repositioning, cryotherapy, Electrical stimulation, Iontophoresis with 4 mg/ml dexamethasome, Moist heat, traction, Ultrasound, gait training, Therapeutic exercise, balance training, neuromuscular re-education, patient/family  education, prosthetic training, manual techniques, passive ROM, dry needling, taping, vasopnuematic device, vestibular, spinal manipulations, joint manipulations  PLAN FOR NEXT SESSION: review/advance HEP, periscapular strengthening  Lili Finder, Student-PT 10/03/2024, 1:42 PM  This entire session was performed under direct supervision and direction of a licensed therapist/therapist assistant . I have personally read, edited and approve of the note as written. 1:53 PM, 10/03/24 Prentice CANDIE Stains PT, DPT Physical Therapist at [facility]

## 2024-10-10 ENCOUNTER — Encounter (HOSPITAL_BASED_OUTPATIENT_CLINIC_OR_DEPARTMENT_OTHER): Admitting: Physical Therapy

## 2024-10-16 ENCOUNTER — Other Ambulatory Visit (HOSPITAL_COMMUNITY)
Admission: RE | Admit: 2024-10-16 | Discharge: 2024-10-16 | Disposition: A | Source: Ambulatory Visit | Attending: Obstetrics & Gynecology | Admitting: Obstetrics & Gynecology

## 2024-10-16 ENCOUNTER — Ambulatory Visit (HOSPITAL_BASED_OUTPATIENT_CLINIC_OR_DEPARTMENT_OTHER): Payer: BC Managed Care – PPO | Admitting: Obstetrics & Gynecology

## 2024-10-16 ENCOUNTER — Encounter (HOSPITAL_BASED_OUTPATIENT_CLINIC_OR_DEPARTMENT_OTHER): Payer: Self-pay | Admitting: Obstetrics & Gynecology

## 2024-10-16 VITALS — BP 120/79 | HR 85 | Ht 65.5 in | Wt 186.6 lb

## 2024-10-16 DIAGNOSIS — Z01419 Encounter for gynecological examination (general) (routine) without abnormal findings: Secondary | ICD-10-CM

## 2024-10-16 DIAGNOSIS — Z124 Encounter for screening for malignant neoplasm of cervix: Secondary | ICD-10-CM

## 2024-10-16 DIAGNOSIS — I480 Paroxysmal atrial fibrillation: Secondary | ICD-10-CM

## 2024-10-16 DIAGNOSIS — Z1151 Encounter for screening for human papillomavirus (HPV): Secondary | ICD-10-CM | POA: Diagnosis not present

## 2024-10-16 DIAGNOSIS — Z87440 Personal history of urinary (tract) infections: Secondary | ICD-10-CM

## 2024-10-16 DIAGNOSIS — Z1331 Encounter for screening for depression: Secondary | ICD-10-CM | POA: Diagnosis not present

## 2024-10-16 DIAGNOSIS — B009 Herpesviral infection, unspecified: Secondary | ICD-10-CM

## 2024-10-16 MED ORDER — VALACYCLOVIR HCL 1 G PO TABS: ORAL_TABLET | ORAL | 1 refills | Status: AC

## 2024-10-16 NOTE — Progress Notes (Signed)
 ANNUAL EXAM Patient name: Kristina Sanders MRN [id number]  Date of birth: 1960-12-23 Chief Complaint:   Gynecologic Exam  History of Present Illness:   Kristina Sanders is a 63 y.o. [phone] Caucasian female being seen today for a routine annual exam.  Doing well.  H/o afib.  She has follow up scheduled in about a week.    Denies vaginal bleeding.     Patient's last menstrual period was 07/17/2013.   Last pap 09/11/2021. Results were: NILM w/ HRHPV negative. H/O abnormal pap: yes Last mammogram: 05/29/2024. Results were: normal. Family h/o breast cancer: no. Patient is survivor of breast cancer  Last colonoscopy: 02/22/2015. Results were: normal. Family h/o colorectal cancer: no Dexa:   2023.       10/16/2024    2:16 PM 09/25/2024    8:14 AM 10/07/2023    2:19 PM 09/17/2022    3:28 PM 09/11/2021    3:02 PM  Depression screen PHQ 2/9  Decreased Interest 0 0 0 0 0  Down, Depressed, Hopeless 0 0 0 1 0  PHQ - 2 Score 0 0 0 1 0  Altered sleeping  0     Tired, decreased energy  0     Change in appetite  0     Feeling bad or failure about yourself   0     Trouble concentrating  0     Moving slowly or fidgety/restless  0     Suicidal thoughts  0     PHQ-9 Score  0     Difficult doing work/chores  Not difficult at all           09/25/2024    8:14 AM  GAD 7 : Generalized Anxiety Score  Nervous, Anxious, on Edge 0  Control/stop worrying 0  Worry too much - different things 0  Trouble relaxing 0  Restless 0  Easily annoyed or irritable 0  Afraid - awful might happen 0  Total GAD 7 Score 0  Anxiety Difficulty Not difficult at all     Review of Systems:   Pertinent items are noted in HPI Denies any urinary or bowel changes.  Denies pelvic pain.   Pertinent History Reviewed:  Reviewed past medical,surgical, social and family history.  Reviewed problem list, medications and allergies. Physical Assessment:   Vitals:   10/16/24 1416  BP: 120/79  Pulse: 85  SpO2: 100%   Weight: 186 lb 9.6 oz (84.6 kg)  Height: 5' 5.5 (1.664 m)  Body mass index is 30.58 kg/m.        Physical Examination:   General appearance - well appearing, and in no distress  Mental status - alert, oriented to person, place, and time  Psych:  She has a normal mood and affect  Skin - warm and dry, normal color, no suspicious lesions noted  Chest - effort normal, all lung fields clear to auscultation bilaterally  Heart - normal rate and regular rhythm  Neck:  midline trachea, no thyromegaly or nodules  Breasts - breasts appear normal, no suspicious masses, no skin or nipple changes or  axillary nodes, well healed scar left breast present  Abdomen - soft, nontender, nondistended, no masses or organomegaly  Pelvic - VULVA: normal appearing vulva with no masses, tenderness or lesions   VAGINA: atrophic changes noted, no lesions   CERVIX: normal appearing cervix without discharge or lesions, no CMT  Thin prep pap is updated today  UTERUS: uterus is felt to be normal size,  shape, consistency and nontender   ADNEXA: No adnexal masses or tenderness noted.  Rectal - normal rectal, good sphincter tone, no masses felt  Extremities:  No swelling or varicosities noted  Chaperone present for exam  No results found for this or any previous visit (from the past 24 hours).  Assessment & Plan:  1. Well woman exam with routine gynecological exam (Primary) - Pap smear with HR HPV obtained  - Mammogram 57974 - Colonoscopy 02/22/2015.  Follow up 10 years.   - Bone mineral density has been ordered by Kristina Sanders.  We discussed proceeding with this now.   - lab work done with PCP, Kristina Sanders - vaccines reviewed/updated  2. Cervical cancer screening - Cytology - PAP( [facility])  3. HSV (herpes simplex virus) infection - valACYclovir  (VALTREX ) 1000 MG tablet; 2 grams x 1, repeat in 12 hours.  Use with symptom onset.  Dispense: 30 tablet; Refill: 1  4. History of recurrent UTIs  5. Paroxysmal  atrial fibrillation (HCC) - followed by Dr. Lonni   No orders of the defined types were placed in this encounter.   Meds:  Meds ordered this encounter  Medications   valACYclovir  (VALTREX ) 1000 MG tablet    Sig: 2 grams x 1, repeat in 12 hours.  Use with symptom onset.    Dispense:  30 tablet    Refill:  1     Ronal Sanders [place], MD 10/16/2024 3:01 PM

## 2024-10-17 LAB — CYTOLOGY - PAP
Comment: NEGATIVE
Diagnosis: NEGATIVE
High risk HPV: NEGATIVE

## 2024-10-18 ENCOUNTER — Ambulatory Visit (HOSPITAL_BASED_OUTPATIENT_CLINIC_OR_DEPARTMENT_OTHER): Payer: Self-pay | Admitting: Obstetrics & Gynecology

## 2024-10-23 ENCOUNTER — Ambulatory Visit (HOSPITAL_BASED_OUTPATIENT_CLINIC_OR_DEPARTMENT_OTHER): Admitting: Cardiology

## 2024-10-23 ENCOUNTER — Encounter (HOSPITAL_BASED_OUTPATIENT_CLINIC_OR_DEPARTMENT_OTHER): Payer: Self-pay | Admitting: Cardiology

## 2024-10-23 VITALS — BP 122/64 | HR 77 | Ht 65.5 in | Wt 186.9 lb

## 2024-10-23 DIAGNOSIS — E7800 Pure hypercholesterolemia, unspecified: Secondary | ICD-10-CM

## 2024-10-23 DIAGNOSIS — Z7901 Long term (current) use of anticoagulants: Secondary | ICD-10-CM | POA: Diagnosis not present

## 2024-10-23 DIAGNOSIS — I480 Paroxysmal atrial fibrillation: Secondary | ICD-10-CM | POA: Diagnosis not present

## 2024-10-23 DIAGNOSIS — D6869 Other thrombophilia: Secondary | ICD-10-CM

## 2024-10-23 DIAGNOSIS — Z7189 Other specified counseling: Secondary | ICD-10-CM

## 2024-10-23 DIAGNOSIS — Z79899 Other long term (current) drug therapy: Secondary | ICD-10-CM

## 2024-10-23 DIAGNOSIS — I10 Essential (primary) hypertension: Secondary | ICD-10-CM

## 2024-10-23 MED ORDER — ROSUVASTATIN CALCIUM 5 MG PO TABS: 5.0000 mg | ORAL_TABLET | Freq: Every day | ORAL | 3 refills | Status: AC

## 2024-10-23 MED ORDER — APIXABAN 5 MG PO TABS: 5.0000 mg | ORAL_TABLET | Freq: Two times a day (BID) | ORAL | 3 refills | Status: AC

## 2024-10-23 NOTE — Progress Notes (Signed)
 Cardiology Office Note:  .   Date:  10/23/2024  ID:  Kristina Sanders, DOB 1961/06/09, MRN [id number] PCP: Knute Thersia Bitters, FNP  [facility] Providers Cardiologist:  Shelda Bruckner, MD {  History of Present Illness: .   Kristina Sanders is a 63 y.o. female PMH paroxysmal atrial fibrillation, hypertension, hyperlipidemia, history of stage 1a breast cancer s/p surgery, radiation, anastrozole . I met her 04/27/2023.   Today: Did not start statin as she wanted more time to work on lifestyle. Got her blood work back and lipids were unchanged. She is interested in starting statin now.  Blood pressure is well controlled, tolerating meds well.  One episode of afib in about the last three months, lasted all morning. Wonders if dehydration/fatigue may be related. Bruises easily but no bleeding issues.  ROS: Denies chest pain, shortness of breath at rest or with normal exertion. No PND, orthopnea, LE edema or unexpected weight gain. No syncope. ROS otherwise negative except as noted.   Studies Reviewed: SABRA    EKG:  EKG Interpretation Date/Time:  Monday October 23 2024 09:11:54 EST Ventricular Rate:  77 PR Interval:  192 QRS Duration:  76 QT Interval:  358 QTC Calculation: 405 R Axis:   61  Text Interpretation: Normal sinus rhythm Nonspecific ST abnormality When compared with ECG of 14-Sep-2022 09:13, Sinus rhythm has replaced Atrial fibrillation Confirmed by Bruckner Shelda [phone]) on 10/23/2024 9:24:33 AM    Physical Exam:   VS:  BP 122/64 (BP Location: Right Arm, Patient Position: Sitting, Cuff Size: Normal)   Pulse 77   Ht 5' 5.5 (1.664 m)   Wt 186 lb 14.4 oz (84.8 kg)   LMP 07/17/2013   SpO2 97%   BMI 30.63 kg/m    Wt Readings from Last 3 Encounters:  10/23/24 186 lb 14.4 oz (84.8 kg)  10/16/24 186 lb 9.6 oz (84.6 kg)  09/25/24 185 lb (83.9 kg)    GEN: Well nourished, well developed in no acute distress HEENT: Normal, moist mucous membranes NECK: No  JVD CARDIAC: regular rhythm, normal S1 and S2, no rubs or gallops. No murmur. VASCULAR: Radial and DP pulses 2+ bilaterally. No carotid bruits RESPIRATORY:  Clear to auscultation without rales, wheezing or rhonchi  ABDOMEN: Soft, non-tender, non-distended MUSCULOSKELETAL:  Ambulates independently SKIN: Warm and dry, no edema NEUROLOGIC:  Alert and oriented x 3. No focal neuro deficits noted. PSYCHIATRIC:  Normal affect    ASSESSMENT AND PLAN: .    Paroxysmal atrial fibrillation -CHA2DS2/VAS Stroke Risk Points =2 -discussed aspirin  vs. DOAC. She is a 2 (one of which is for gender). Tolerating apixaban . -discussed antiarrhythmic, ablation. Consider if symptoms worse -continue metoprolol  succinate.   Hypertension -well controlled today -continue amlodipine  5 mg daily, losartan -hydrochlorothiazide 100-12.5 mg daily, metoprolol  succinate 50 mg daily   Hypercholesterolemia -LDL 179->142 with diet changes. Most recent LDL 144, TG 166 09/2024 -history of radiation with prior breast cancer. May skew calcifications/Ca score -we discussed rosuvastatin  at her last visit, but did not start. She would like to start now. Will start with 5 mg rosuvastatin  and titrate. For primary prevention, LDL ~100 is goal. -recheck lipids and lfts in 3 mos  CV risk counseling and prevention -recommend heart healthy/Mediterranean diet, with whole grains, fruits, vegetable, fish, lean meats, nuts, and olive oil. Limit salt. -recommend moderate walking, 3-5 times/week for 30-50 minutes each session. Aim for at least 150 minutes.week. Goal should be pace of 3 miles/hours, or walking 1.5 miles in 30 minutes -recommend avoidance  of tobacco products. Avoid excess alcohol. -ASCVD risk score: The 10-year ASCVD risk score (Arnett DK, et al., 2019) is: 6%   Values used to calculate the score:     Age: 81 years     Clincally relevant sex: Female     Is Non-Hispanic African American: No     Diabetic: No     Tobacco  smoker: No     Systolic Blood Pressure: 122 mmHg     Is BP treated: Yes     HDL Cholesterol: 54 mg/dL     Total Cholesterol: 228 mg/dL    Dispo: 6 mos or sooner as needed  Signed, Shelda Bruckner, MD   Shelda Bruckner, MD, PhD, [place] [facility]  [facility]  [place]  Heart & Vascular at [facility] at [facility] [address] [place], [place] [address] [phone]

## 2024-10-23 NOTE — Patient Instructions (Signed)
 Medication Instructions:  Your physician has recommended you make the following change in your medication:  1.) start rosuvastatin  5 mg - take one tablet daily  *If you need a refill on your cardiac medications before your next appointment, please call your pharmacy*  Lab Work: In 3 months - return for fasting blood work (lipids, liver function)  Testing/Procedures: none  Follow-Up: At [facility], you and your health needs are our priority.  As part of our continuing mission to provide you with exceptional heart care, our providers are all part of one team.  This team includes your primary Cardiologist (physician) and Advanced Practice Providers or APPs (Physician Assistants and Nurse Practitioners) who all work together to provide you with the care you need, when you need it.  Your next appointment:   6 month(s)  Provider:   Shelda Bruckner, MD, Rosaline Bane, NP, or Reche Finder, NP

## 2024-10-27 ENCOUNTER — Other Ambulatory Visit (HOSPITAL_BASED_OUTPATIENT_CLINIC_OR_DEPARTMENT_OTHER): Payer: Self-pay | Admitting: Cardiology

## 2024-10-27 DIAGNOSIS — I480 Paroxysmal atrial fibrillation: Secondary | ICD-10-CM

## 2024-11-22 ENCOUNTER — Ambulatory Visit (HOSPITAL_BASED_OUTPATIENT_CLINIC_OR_DEPARTMENT_OTHER): Payer: Self-pay | Admitting: Physical Therapy

## 2025-02-09 ENCOUNTER — Other Ambulatory Visit

## 2025-02-09 ENCOUNTER — Ambulatory Visit: Admitting: Nurse Practitioner

## 2025-09-26 ENCOUNTER — Encounter (HOSPITAL_BASED_OUTPATIENT_CLINIC_OR_DEPARTMENT_OTHER): Admitting: Family Medicine
